# Patient Record
Sex: Male | Born: 1945 | Race: White | Hispanic: No
Health system: Midwestern US, Academic
[De-identification: ages and names within clinical notes are randomized; demographics above are authoritative.]

---

## 2020-06-15 ENCOUNTER — Encounter: Admit: 2020-06-15 | Discharge: 2020-06-15 | Payer: MEDICARE | Primary: Family

## 2020-06-18 ENCOUNTER — Encounter: Admit: 2020-06-18 | Discharge: 2020-06-18 | Payer: MEDICARE | Primary: Family

## 2020-06-19 ENCOUNTER — Encounter: Admit: 2020-06-19 | Discharge: 2020-06-19 | Payer: MEDICARE | Primary: Family

## 2020-06-21 ENCOUNTER — Encounter: Admit: 2020-06-21 | Discharge: 2020-06-21 | Payer: Commercial Managed Care - PPO | Primary: Family

## 2020-06-21 DIAGNOSIS — I38 Endocarditis, valve unspecified: Secondary | ICD-10-CM

## 2020-06-21 DIAGNOSIS — R011 Cardiac murmur, unspecified: Secondary | ICD-10-CM

## 2020-06-21 DIAGNOSIS — M1710 Unilateral primary osteoarthritis, unspecified knee: Secondary | ICD-10-CM

## 2020-06-21 DIAGNOSIS — I350 Nonrheumatic aortic (valve) stenosis: Secondary | ICD-10-CM

## 2020-06-21 DIAGNOSIS — Z720 Tobacco use: Secondary | ICD-10-CM

## 2020-06-21 DIAGNOSIS — E785 Hyperlipidemia, unspecified: Secondary | ICD-10-CM

## 2020-06-21 DIAGNOSIS — M109 Gout, unspecified: Secondary | ICD-10-CM

## 2020-06-21 DIAGNOSIS — E781 Pure hyperglyceridemia: Secondary | ICD-10-CM

## 2020-06-21 DIAGNOSIS — N401 Enlarged prostate with lower urinary tract symptoms: Secondary | ICD-10-CM

## 2020-06-21 DIAGNOSIS — E669 Obesity, unspecified: Secondary | ICD-10-CM

## 2020-06-25 ENCOUNTER — Ambulatory Visit: Admit: 2020-06-25 | Discharge: 2020-06-25 | Payer: Commercial Managed Care - PPO | Primary: Family

## 2020-06-25 ENCOUNTER — Encounter: Admit: 2020-06-25 | Discharge: 2020-06-25 | Payer: Commercial Managed Care - PPO | Primary: Family

## 2020-06-25 DIAGNOSIS — G40909 Epilepsy, unspecified, not intractable, without status epilepticus: Secondary | ICD-10-CM

## 2020-06-25 DIAGNOSIS — R0902 Hypoxemia: Secondary | ICD-10-CM

## 2020-06-25 DIAGNOSIS — I509 Heart failure, unspecified: Secondary | ICD-10-CM

## 2020-06-25 DIAGNOSIS — E8770 Fluid overload, unspecified: Secondary | ICD-10-CM

## 2020-06-25 DIAGNOSIS — I4891 Unspecified atrial fibrillation: Secondary | ICD-10-CM

## 2020-06-25 DIAGNOSIS — F419 Anxiety disorder, unspecified: Secondary | ICD-10-CM

## 2020-06-26 ENCOUNTER — Encounter: Admit: 2020-06-26 | Discharge: 2020-06-26 | Payer: Commercial Managed Care - PPO | Primary: Family
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
## 2020-06-28 ENCOUNTER — Encounter: Admit: 2020-06-28 | Discharge: 2020-06-28 | Payer: Commercial Managed Care - PPO | Primary: Family

## 2020-08-21 ENCOUNTER — Encounter: Admit: 2020-08-21 | Discharge: 2020-08-21 | Payer: Commercial Managed Care - PPO | Primary: Family

## 2020-08-21 DIAGNOSIS — E785 Hyperlipidemia, unspecified: Secondary | ICD-10-CM

## 2020-08-21 DIAGNOSIS — Z952 Presence of prosthetic heart valve: Secondary | ICD-10-CM

## 2020-08-21 DIAGNOSIS — E669 Obesity, unspecified: Secondary | ICD-10-CM

## 2020-08-21 DIAGNOSIS — M109 Gout, unspecified: Secondary | ICD-10-CM

## 2020-08-21 DIAGNOSIS — E781 Pure hyperglyceridemia: Secondary | ICD-10-CM

## 2020-08-21 DIAGNOSIS — I10 Essential (primary) hypertension: Secondary | ICD-10-CM

## 2020-08-21 DIAGNOSIS — N401 Enlarged prostate with lower urinary tract symptoms: Secondary | ICD-10-CM

## 2020-08-21 DIAGNOSIS — R011 Cardiac murmur, unspecified: Secondary | ICD-10-CM

## 2020-08-21 DIAGNOSIS — Z720 Tobacco use: Secondary | ICD-10-CM

## 2020-08-21 DIAGNOSIS — I350 Nonrheumatic aortic (valve) stenosis: Secondary | ICD-10-CM

## 2020-08-21 DIAGNOSIS — M1710 Unilateral primary osteoarthritis, unspecified knee: Secondary | ICD-10-CM

## 2020-08-21 DIAGNOSIS — I38 Endocarditis, valve unspecified: Secondary | ICD-10-CM

## 2020-08-21 NOTE — Patient Instructions
Please continue to monitor vitals daily. Goal for heart rate to be less than 100. If Jacob Dickerson's heart rate is persistently greater than 100, he needs to be seen and evaluated.     Follow up in 3 months with Dr. Sandria Manly.   Labs to be drawn today 08/21/20.    Follow up as directed.  Call sooner if issues.  Call the [facility] nursing line at [phone].  Leave a detailed message for the nurse in [place] [facility]/[place] with how we can assist you and we will call you back.

## 2020-09-27 ENCOUNTER — Encounter: Admit: 2020-09-27 | Discharge: 2020-09-27 | Payer: Commercial Managed Care - PPO | Primary: Family

## 2020-09-27 NOTE — Telephone Encounter
-----   Message from Altamease Oiler, MD sent at 09/27/2020  2:27 PM CST -----  Regarding: RE: elliquis hold epidural  Yes.  Thanks Brett Canales!  ----- Message -----  From: Weston Brass  Sent: 09/27/2020   2:21 PM CST  To: Altamease Oiler, MD  Subject: elliquis hold epidural                           Ok to hold eliquis x 3 days for epidural?    Thanks  Brett Canales

## 2020-10-23 ENCOUNTER — Encounter: Admit: 2020-10-23 | Discharge: 2020-10-23 | Payer: MEDICARE | Primary: Family

## 2020-10-23 NOTE — Progress Notes
attempted to reach pt about labs ordered by wtl at last office visit. no answer.labs not found in [place] system. lmom to have labs drawn before upcoming ov on 2.8.22 w/ wtl. lab orders faxed to [facility] lab

## 2020-10-30 ENCOUNTER — Encounter: Admit: 2020-10-30 | Discharge: 2020-10-30 | Payer: MEDICARE | Primary: Family

## 2020-10-30 DIAGNOSIS — E785 Hyperlipidemia, unspecified: Secondary | ICD-10-CM

## 2020-10-30 DIAGNOSIS — E669 Obesity, unspecified: Secondary | ICD-10-CM

## 2020-10-30 DIAGNOSIS — I10 Essential (primary) hypertension: Secondary | ICD-10-CM

## 2020-10-30 DIAGNOSIS — I38 Endocarditis, valve unspecified: Secondary | ICD-10-CM

## 2020-10-30 DIAGNOSIS — R000 Tachycardia, unspecified: Secondary | ICD-10-CM

## 2020-10-30 DIAGNOSIS — N401 Enlarged prostate with lower urinary tract symptoms: Secondary | ICD-10-CM

## 2020-10-30 DIAGNOSIS — M1710 Unilateral primary osteoarthritis, unspecified knee: Secondary | ICD-10-CM

## 2020-10-30 DIAGNOSIS — E781 Pure hyperglyceridemia: Secondary | ICD-10-CM

## 2020-10-30 DIAGNOSIS — Z720 Tobacco use: Secondary | ICD-10-CM

## 2020-10-30 DIAGNOSIS — I350 Nonrheumatic aortic (valve) stenosis: Secondary | ICD-10-CM

## 2020-10-30 DIAGNOSIS — R011 Cardiac murmur, unspecified: Secondary | ICD-10-CM

## 2020-10-30 DIAGNOSIS — Z952 Presence of prosthetic heart valve: Secondary | ICD-10-CM

## 2020-10-30 DIAGNOSIS — M109 Gout, unspecified: Secondary | ICD-10-CM

## 2020-10-30 MED ORDER — METOPROLOL SUCCINATE 100 MG PO TB24
150 mg | ORAL_TABLET | Freq: Every day | ORAL | 3 refills | 90.00000 days | Status: AC
Start: 2020-10-30 — End: ?

## 2020-11-08 ENCOUNTER — Encounter

## 2020-11-08 DIAGNOSIS — Z952 Presence of prosthetic heart valve: Secondary | ICD-10-CM

## 2020-11-08 DIAGNOSIS — R000 Tachycardia, unspecified: Secondary | ICD-10-CM

## 2020-11-08 DIAGNOSIS — I10 Essential (primary) hypertension: Secondary | ICD-10-CM

## 2020-11-08 DIAGNOSIS — E785 Hyperlipidemia, unspecified: Secondary | ICD-10-CM

## 2020-11-08 LAB — BASIC METABOLIC PANEL
Lab: 1.2 — ABNORMAL HIGH (ref 0.72–1.25)
Lab: 138 — ABNORMAL HIGH (ref <150)
Lab: 29 — ABNORMAL HIGH (ref 5–40)
Lab: 3.8 — ABNORMAL LOW (ref >=40)
Lab: 38 — ABNORMAL HIGH (ref 8.4–25.7)
Lab: 9
Lab: 92
Lab: 99

## 2020-11-08 LAB — LIPID PROFILE: Lab: 126

## 2020-11-09 NOTE — Telephone Encounter
Called patient's nursing facility to check on patient's heart rates and blood pressures after increasing his Toprol XL to 150mg  daily. Spoke to [place], [place] who reports patient's heart rate has been running in the 70s-80s range with pressures in the 110s-120s/70s. The nurse reports patient has no complaints with medication change.       Will route to [facility] for any additional recommendations.

## 2020-11-13 ENCOUNTER — Encounter: Admit: 2020-11-13 | Discharge: 2020-11-13 | Payer: MEDICARE | Primary: Family

## 2020-11-13 DIAGNOSIS — I10 Essential (primary) hypertension: Secondary | ICD-10-CM

## 2020-11-13 DIAGNOSIS — Z952 Presence of prosthetic heart valve: Secondary | ICD-10-CM

## 2020-11-13 NOTE — Telephone Encounter
Called and discussed results with patient.  No questions at this time.  Pt will callback with any questions, concerns or problems.

## 2020-11-13 NOTE — Telephone Encounter
-----   Message from Altamease Oiler, MD sent at 11/12/2020  6:00 PM CST -----  Kidney function looks a little worse.  Lets repeat a basic metabolic panel in 2 weeks.  Also, if he is taking ibuprofen or Motrin that he should cut back or stop.Thanks!  ----- Message -----  From: Rogelia Boga, RN  Sent: 11/08/2020   1:50 PM CST  To: Altamease Oiler, MD    Labs for your review, pt is on atorvastatin 10mg  daily.

## 2020-12-13 ENCOUNTER — Encounter: Admit: 2020-12-13 | Discharge: 2020-12-13 | Payer: MEDICARE | Primary: Family

## 2020-12-13 NOTE — Telephone Encounter
Have called multiple times and left messages requesting pt to have labs rechecked.

## 2021-01-04 ENCOUNTER — Encounter: Admit: 2021-01-04 | Discharge: 2021-01-04 | Payer: MEDICARE | Primary: Family

## 2021-01-04 NOTE — Telephone Encounter
Received call from [facility] with [facility] [phone]) requesting cardiac risk stratification for upcoming procedure.  Mr. Yoak is being scheduled for a wide local excision with flap closure on left forearm.  They are needing risk stratification and recommendations regarding holding Eliquis.  They would like to hold Eliquis for 2 days prior to procedure.      Pt was last seen 10/30/20 by Dr. Sandria Manly. Most recent testing includes an echo in 06/2020.    Left message to check cardiac symptoms.      Will route to Dr. Sandria Manly for recommendations.

## 2021-02-04 ENCOUNTER — Encounter: Admit: 2021-02-04 | Discharge: 2021-02-04 | Payer: MEDICARE | Primary: Family

## 2021-02-04 DIAGNOSIS — E785 Hyperlipidemia, unspecified: Secondary | ICD-10-CM

## 2021-02-04 DIAGNOSIS — Z952 Presence of prosthetic heart valve: Secondary | ICD-10-CM

## 2021-02-04 DIAGNOSIS — I10 Essential (primary) hypertension: Secondary | ICD-10-CM

## 2021-02-04 NOTE — Telephone Encounter
Received call from Shanda Bumps, medical assistant with Dr. Nicholaus Bloom office [phone]).  She called to report that patient saw Dr. Nicholaus Bloom prior to his upcoming carpel tunnel release surgery.  She was concerned because patient has had a 13 pound weight gain over 3 months.  He did express to her that he has noticed some increased dyspnea with exertion.  He does not have any new edema.  They repeated labs which she said were stable.  I have requested that the labs be faxed over for review.      Called pt to discuss symptoms.  He states that he had been having a lot of back pain.  He said the shortness of breath was more because his back hurt, not really that he felt short of breath.  He states that he saw a neurologist and he prescribed gabapentin and a muscle relaxer.  He states that his back is feeling much better and he is getting around a lot better.  He states that he has been working on his diet and he has lost a few pounds, but will continue to work on his diet.      Will route to Dr. Sandria Manly for recommendations.

## 2021-03-12 ENCOUNTER — Encounter: Admit: 2021-03-12 | Discharge: 2021-03-12 | Payer: MEDICARE | Primary: Family

## 2021-03-12 DIAGNOSIS — I4891 Unspecified atrial fibrillation: Secondary | ICD-10-CM

## 2021-03-12 DIAGNOSIS — Z720 Tobacco use: Secondary | ICD-10-CM

## 2021-03-12 DIAGNOSIS — N401 Enlarged prostate with lower urinary tract symptoms: Secondary | ICD-10-CM

## 2021-03-12 DIAGNOSIS — Z952 Presence of prosthetic heart valve: Secondary | ICD-10-CM

## 2021-03-12 DIAGNOSIS — I38 Endocarditis, valve unspecified: Secondary | ICD-10-CM

## 2021-03-12 DIAGNOSIS — E669 Obesity, unspecified: Secondary | ICD-10-CM

## 2021-03-12 DIAGNOSIS — M1710 Unilateral primary osteoarthritis, unspecified knee: Secondary | ICD-10-CM

## 2021-03-12 DIAGNOSIS — E785 Hyperlipidemia, unspecified: Secondary | ICD-10-CM

## 2021-03-12 DIAGNOSIS — M109 Gout, unspecified: Secondary | ICD-10-CM

## 2021-03-12 DIAGNOSIS — R011 Cardiac murmur, unspecified: Secondary | ICD-10-CM

## 2021-03-12 DIAGNOSIS — I10 Essential (primary) hypertension: Secondary | ICD-10-CM

## 2021-03-12 DIAGNOSIS — E781 Pure hyperglyceridemia: Secondary | ICD-10-CM

## 2021-03-12 DIAGNOSIS — I350 Nonrheumatic aortic (valve) stenosis: Secondary | ICD-10-CM

## 2021-03-12 NOTE — Patient Instructions
Echo    Follow up as directed.  Call sooner if issues.  Call the [facility] nursing line at [phone].  Leave a detailed message for the nurse in [place] with how we can assist you and we will call you back.

## 2021-03-14 ENCOUNTER — Encounter: Admit: 2021-03-14 | Discharge: 2021-03-14 | Payer: MEDICARE | Primary: Family

## 2021-03-14 ENCOUNTER — Ambulatory Visit: Admit: 2021-03-14 | Discharge: 2021-03-14 | Payer: MEDICARE | Primary: Family

## 2021-03-14 DIAGNOSIS — I10 Essential (primary) hypertension: Secondary | ICD-10-CM

## 2021-04-23 ENCOUNTER — Ambulatory Visit: Admit: 2021-04-23 | Discharge: 2021-04-23 | Payer: MEDICARE | Primary: Family

## 2021-04-23 ENCOUNTER — Encounter: Admit: 2021-04-23 | Discharge: 2021-04-23 | Payer: MEDICARE | Primary: Family

## 2021-04-23 DIAGNOSIS — I509 Heart failure, unspecified: Secondary | ICD-10-CM

## 2021-04-23 DIAGNOSIS — R079 Chest pain, unspecified: Secondary | ICD-10-CM

## 2021-05-14 ENCOUNTER — Encounter: Admit: 2021-05-14 | Discharge: 2021-05-14 | Payer: MEDICARE | Primary: Family

## 2021-05-14 DIAGNOSIS — I38 Endocarditis, valve unspecified: Secondary | ICD-10-CM

## 2021-05-14 DIAGNOSIS — E781 Pure hyperglyceridemia: Secondary | ICD-10-CM

## 2021-05-14 DIAGNOSIS — I350 Nonrheumatic aortic (valve) stenosis: Secondary | ICD-10-CM

## 2021-05-14 DIAGNOSIS — R011 Cardiac murmur, unspecified: Secondary | ICD-10-CM

## 2021-05-14 DIAGNOSIS — E669 Obesity, unspecified: Secondary | ICD-10-CM

## 2021-05-14 DIAGNOSIS — M109 Gout, unspecified: Secondary | ICD-10-CM

## 2021-05-14 DIAGNOSIS — I4891 Unspecified atrial fibrillation: Secondary | ICD-10-CM

## 2021-05-14 DIAGNOSIS — N401 Enlarged prostate with lower urinary tract symptoms: Secondary | ICD-10-CM

## 2021-05-14 DIAGNOSIS — M1710 Unilateral primary osteoarthritis, unspecified knee: Secondary | ICD-10-CM

## 2021-05-14 DIAGNOSIS — Z720 Tobacco use: Secondary | ICD-10-CM

## 2021-05-14 DIAGNOSIS — E785 Hyperlipidemia, unspecified: Secondary | ICD-10-CM

## 2021-05-28 ENCOUNTER — Encounter: Admit: 2021-05-28 | Discharge: 2021-05-28 | Payer: MEDICARE | Primary: Family

## 2021-05-28 ENCOUNTER — Inpatient Hospital Stay: Admit: 2021-05-28 | Discharge: 2021-05-28 | Payer: MEDICARE | Primary: Family

## 2021-05-28 DIAGNOSIS — I5043 Acute on chronic combined systolic (congestive) and diastolic (congestive) heart failure: Secondary | ICD-10-CM

## 2021-05-28 LAB — BNP (B-TYPE NATRIURETIC PEPTI): BNP: 144 pg/mL — ABNORMAL HIGH (ref 0–100)

## 2021-05-28 LAB — COMPREHENSIVE METABOLIC PANEL
ALBUMIN: 4 g/dL (ref 3.5–5.0)
ALK PHOSPHATASE: 72 U/L (ref 25–110)
ALT: 12 U/L (ref 7–56)
ANION GAP: 13 — ABNORMAL HIGH (ref 3–12)
AST: 26 U/L (ref 7–40)
BLD UREA NITROGEN: 43 mg/dL — ABNORMAL HIGH (ref 7–25)
CALCIUM: 9.2 mg/dL — ABNORMAL HIGH (ref 8.5–10.6)
CHLORIDE: 102 MMOL/L — ABNORMAL LOW (ref 98–110)
CO2: 30 MMOL/L (ref 21–30)
CREATININE: 1.8 mg/dL — ABNORMAL HIGH (ref 0.4–1.24)
EGFR: 39 mL/min — ABNORMAL LOW (ref >60)
GLUCOSE,PANEL: 102 mg/dL — ABNORMAL HIGH (ref 70–100)
POTASSIUM: 4.6 MMOL/L — ABNORMAL LOW (ref 3.5–5.1)
SODIUM: 145 MMOL/L — ABNORMAL LOW (ref 137–147)
TOTAL BILIRUBIN: 1 mg/dL (ref 0.3–1.2)
TOTAL PROTEIN: 6.9 g/dL (ref 6.0–8.0)

## 2021-05-28 LAB — CBC: WBC COUNT: 8.2 K/UL (ref 4.5–11.0)

## 2021-05-28 LAB — DIGOXIN LEVEL: DIGOXIN: 1.2 ng/mL — ABNORMAL HIGH (ref 0.5–1.0)

## 2021-05-28 LAB — TROPONIN-I: TROPONIN I: 0 ng/mL — ABNORMAL HIGH (ref 0.0–0.05)

## 2021-05-28 MED ORDER — TAMSULOSIN 0.4 MG PO CAP
.4 mg | Freq: Every day | ORAL | 0 refills | Status: AC
Start: 2021-05-28 — End: ?
  Administered 2021-05-29 – 2021-06-03 (×6): 0.4 mg via ORAL

## 2021-05-28 MED ORDER — DILTIAZEM HCL 120 MG PO CP24
120 mg | Freq: Every day | ORAL | 0 refills | Status: AC
Start: 2021-05-28 — End: ?
  Administered 2021-05-29 (×2): 120 mg via ORAL

## 2021-05-28 MED ORDER — IMS MIXTURE TEMPLATE
150 mg | Freq: Every day | ORAL | 0 refills | Status: DC
Start: 2021-05-28 — End: 2021-05-29

## 2021-05-28 MED ORDER — ONDANSETRON 4 MG PO TBDI
4 mg | ORAL | 0 refills | Status: AC | PRN
Start: 2021-05-28 — End: ?

## 2021-05-28 MED ORDER — LEVOTHYROXINE 75 MCG PO TAB
75 ug | Freq: Every day | ORAL | 0 refills | Status: AC
Start: 2021-05-28 — End: ?
  Administered 2021-05-29 – 2021-06-03 (×6): 75 ug via ORAL

## 2021-05-28 MED ORDER — MELATONIN 5 MG PO TAB
5 mg | Freq: Every evening | ORAL | 0 refills | Status: AC | PRN
Start: 2021-05-28 — End: ?

## 2021-05-28 MED ORDER — ACETAMINOPHEN 325 MG PO TAB
650 mg | ORAL | 0 refills | Status: AC | PRN
Start: 2021-05-28 — End: ?
  Administered 2021-05-30 – 2021-06-03 (×4): 650 mg via ORAL

## 2021-05-28 MED ORDER — DIVALPROEX 250 MG PO TBEC
750 mg | Freq: Every evening | ORAL | 0 refills | Status: AC
Start: 2021-05-28 — End: ?
  Administered 2021-05-29 – 2021-06-03 (×6): 750 mg via ORAL

## 2021-05-28 MED ORDER — FLUTICASONE PROPIONATE 50 MCG/ACTUATION NA SPSN
2 | Freq: Every day | NASAL | 0 refills | Status: AC
Start: 2021-05-28 — End: ?
  Administered 2021-05-29: 13:00:00 2 via NASAL

## 2021-05-28 MED ORDER — POLYETHYLENE GLYCOL 3350 17 GRAM PO PWPK
1 | Freq: Every day | ORAL | 0 refills | Status: AC | PRN
Start: 2021-05-28 — End: ?

## 2021-05-28 MED ORDER — ATORVASTATIN 10 MG PO TAB
10 mg | Freq: Every day | ORAL | 0 refills | Status: AC
Start: 2021-05-28 — End: ?
  Administered 2021-05-29 – 2021-06-03 (×7): 10 mg via ORAL

## 2021-05-28 MED ORDER — ONDANSETRON HCL (PF) 4 MG/2 ML IJ SOLN
4 mg | INTRAVENOUS | 0 refills | Status: AC | PRN
Start: 2021-05-28 — End: ?

## 2021-05-28 MED ORDER — POTASSIUM CHLORIDE 20 MEQ PO TBTQ
20 meq | Freq: Two times a day (BID) | ORAL | 0 refills | Status: AC
Start: 2021-05-28 — End: ?
  Administered 2021-05-29 (×2): 20 meq via ORAL

## 2021-05-28 MED ORDER — APIXABAN 5 MG PO TAB
5 mg | Freq: Two times a day (BID) | ORAL | 0 refills | Status: AC
Start: 2021-05-28 — End: ?
  Administered 2021-05-29 – 2021-06-03 (×12): 5 mg via ORAL

## 2021-05-28 MED ORDER — SERTRALINE 50 MG PO TAB
50 mg | Freq: Every day | ORAL | 0 refills | Status: AC
Start: 2021-05-28 — End: ?
  Administered 2021-05-29 – 2021-06-03 (×6): 50 mg via ORAL

## 2021-05-28 MED ORDER — SENNOSIDES-DOCUSATE SODIUM 8.6-50 MG PO TAB
1 | Freq: Every day | ORAL | 0 refills | Status: AC | PRN
Start: 2021-05-28 — End: ?

## 2021-05-28 NOTE — Progress Notes
75 yr old worsening SOA for month. Peripheral edema.  No CP. Previously admitted to OSH with CHF. He was diuresised. Cr at discharge was 1.59    Imaging:  CXR- small right pleural effusion   Labs:  WBC 9.3, Hgb 12.0, Hct 40.1, Plat 208, Trop 0.09 (08/24 0.06)  Na 143, K 4.1, BUN 45, Cr 2.01, BNP 157   COVID-19 negative   Meds:  none  Vitals:  173/64  83  26  97.9  90- 91%RA AOX4   EKG- Afib.      Hx:  Follows with Dr Sandria Manly, has been on Torsemide. CHF. HTN. HLD. AFIB. CAD. AVR.     Reason for Transfer:  Hospitalist refusing admission d/t elevated Cr and not improving volume overload

## 2021-05-29 ENCOUNTER — Encounter: Admit: 2021-05-29 | Discharge: 2021-05-29 | Payer: MEDICARE | Primary: Family

## 2021-05-29 ENCOUNTER — Inpatient Hospital Stay: Admit: 2021-05-29 | Discharge: 2021-05-29 | Payer: MEDICARE | Primary: Family

## 2021-05-29 ENCOUNTER — Inpatient Hospital Stay: Admit: 2021-05-29 | Payer: MEDICARE

## 2021-05-29 DIAGNOSIS — N401 Enlarged prostate with lower urinary tract symptoms: Secondary | ICD-10-CM

## 2021-05-29 DIAGNOSIS — M109 Gout, unspecified: Secondary | ICD-10-CM

## 2021-05-29 DIAGNOSIS — E785 Hyperlipidemia, unspecified: Secondary | ICD-10-CM

## 2021-05-29 DIAGNOSIS — I38 Endocarditis, valve unspecified: Secondary | ICD-10-CM

## 2021-05-29 DIAGNOSIS — I350 Nonrheumatic aortic (valve) stenosis: Secondary | ICD-10-CM

## 2021-05-29 DIAGNOSIS — E781 Pure hyperglyceridemia: Secondary | ICD-10-CM

## 2021-05-29 DIAGNOSIS — M1710 Unilateral primary osteoarthritis, unspecified knee: Secondary | ICD-10-CM

## 2021-05-29 DIAGNOSIS — Z720 Tobacco use: Secondary | ICD-10-CM

## 2021-05-29 DIAGNOSIS — R011 Cardiac murmur, unspecified: Secondary | ICD-10-CM

## 2021-05-29 DIAGNOSIS — E669 Obesity, unspecified: Secondary | ICD-10-CM

## 2021-05-29 DIAGNOSIS — I4891 Unspecified atrial fibrillation: Secondary | ICD-10-CM

## 2021-05-29 MED ADMIN — ALLOPURINOL 100 MG PO TAB [310]: 100 mg | ORAL | @ 23:00:00 | NDC 00904704161

## 2021-05-29 MED ADMIN — METOPROLOL SUCCINATE 50 MG PO TB24 [77931]: 150 mg | ORAL | @ 21:00:00 | NDC 00904632361

## 2021-05-29 MED ADMIN — FUROSEMIDE 10 MG/ML IJ SOLN [3291]: 80 mg | INTRAVENOUS | @ 06:00:00 | Stop: 2021-05-29 | NDC 70860030242

## 2021-05-29 MED ADMIN — FUROSEMIDE 10 MG/ML IJ SOLN [3291]: 80 mg | INTRAVENOUS | @ 13:00:00 | Stop: 2021-05-29 | NDC 70860030242

## 2021-05-29 MED ADMIN — PERFLUTREN LIPID MICROSPHERES 1.1 MG/ML IV SUSP [79178]: 2.5 mL | INTRAVENOUS | @ 21:00:00 | Stop: 2021-05-29 | NDC 11994001116

## 2021-05-29 MED ADMIN — FUROSEMIDE 10 MG/ML IJ SOLN [3291]: 80 mg | INTRAVENOUS | @ 22:00:00 | Stop: 2021-05-29 | NDC 70860030242

## 2021-05-29 MED ADMIN — METOPROLOL SUCCINATE 100 MG PO TB24 [78380]: 150 mg | ORAL | @ 21:00:00 | NDC 00904632461

## 2021-05-29 MED ADMIN — METOLAZONE 5 MG PO TAB [10588]: 5 mg | ORAL | @ 21:00:00 | NDC 51079002401

## 2021-05-30 MED ADMIN — POTASSIUM CHLORIDE 20 MEQ PO TBTQ [35943]: 40 meq | ORAL | @ 13:00:00 | NDC 00245531989

## 2021-05-30 MED ADMIN — METOPROLOL SUCCINATE 100 MG PO TB24 [78380]: 150 mg | ORAL | @ 13:00:00 | NDC 00904632461

## 2021-05-30 MED ADMIN — METOPROLOL SUCCINATE 50 MG PO TB24 [77931]: 150 mg | ORAL | @ 13:00:00 | NDC 00904632361

## 2021-05-30 MED ADMIN — POTASSIUM CHLORIDE 20 MEQ PO TBTQ [35943]: 40 meq | ORAL | @ 02:00:00 | NDC 00245531989

## 2021-05-30 MED ADMIN — METOLAZONE 5 MG PO TAB [10588]: 5 mg | ORAL | @ 13:00:00 | Stop: 2021-05-30 | NDC 51079002401

## 2021-05-30 MED ADMIN — ALLOPURINOL 100 MG PO TAB [310]: 100 mg | ORAL | @ 13:00:00 | NDC 00904704161

## 2021-05-30 MED ADMIN — FUROSEMIDE 10 MG/ML IJ SOLN [3291]: 60 mg | INTRAVENOUS | @ 14:00:00 | Stop: 2021-05-30 | NDC 63323028001

## 2021-05-30 MED ADMIN — TORSEMIDE 20 MG PO TAB [18293]: 40 mg | ORAL | @ 23:00:00 | NDC 68084053911

## 2021-05-31 ENCOUNTER — Inpatient Hospital Stay: Admit: 2021-05-31 | Discharge: 2021-05-31 | Payer: MEDICARE | Primary: Family

## 2021-05-31 MED ORDER — PROPOFOL 10 MG/ML IV EMUL 20 ML (INFUSION)(AM)(OR)
INTRAVENOUS | 0 refills | Status: DC
Start: 2021-05-31 — End: 2021-05-31
  Administered 2021-05-31: 21:00:00 100 ug/kg/min via INTRAVENOUS

## 2021-05-31 MED ORDER — PROPOFOL INJ 10 MG/ML IV VIAL
INTRAVENOUS | 0 refills | Status: DC
Start: 2021-05-31 — End: 2021-05-31
  Administered 2021-05-31: 21:00:00 50 mg via INTRAVENOUS
  Administered 2021-05-31: 21:00:00 10 mg via INTRAVENOUS

## 2021-05-31 MED ORDER — PHENYLEPHRINE HCL IN 0.9% NACL 1 MG/10 ML (100 MCG/ML) IV SYRG
INTRAVENOUS | 0 refills | Status: DC
Start: 2021-05-31 — End: 2021-05-31
  Administered 2021-05-31: 21:00:00 300 ug via INTRAVENOUS
  Administered 2021-05-31: 21:00:00 200 ug via INTRAVENOUS
  Administered 2021-05-31: 21:00:00 100 ug via INTRAVENOUS
  Administered 2021-05-31 (×3): 200 ug via INTRAVENOUS

## 2021-05-31 MED ORDER — SODIUM CHLORIDE 0.9 % IV SOLP (OR) 500ML
INTRAVENOUS | 0 refills | Status: DC
Start: 2021-05-31 — End: 2021-05-31
  Administered 2021-05-31: 21:00:00 via INTRAVENOUS

## 2021-05-31 MED ORDER — LIDOCAINE (PF) 20 MG/ML (2 %) IJ SOLN
INTRAVENOUS | 0 refills | Status: DC
Start: 2021-05-31 — End: 2021-05-31
  Administered 2021-05-31: 21:00:00 80 mg via INTRAVENOUS

## 2021-05-31 MED ADMIN — POTASSIUM CHLORIDE 20 MEQ PO TBTQ [35943]: 40 meq | ORAL | @ 02:00:00 | NDC 00245531989

## 2021-05-31 MED ADMIN — METOPROLOL SUCCINATE 100 MG PO TB24 [78380]: 150 mg | ORAL | @ 14:00:00 | NDC 00904632461

## 2021-05-31 MED ADMIN — TORSEMIDE 20 MG PO TAB [18293]: 40 mg | ORAL | @ 23:00:00 | NDC 68084053911

## 2021-05-31 MED ADMIN — TORSEMIDE 20 MG PO TAB [18293]: 40 mg | ORAL | @ 14:00:00 | NDC 68084053911

## 2021-05-31 MED ADMIN — POTASSIUM CHLORIDE 20 MEQ PO TBTQ [35943]: 40 meq | ORAL | @ 14:00:00 | NDC 00245531989

## 2021-05-31 MED ADMIN — METOPROLOL SUCCINATE 50 MG PO TB24 [77931]: 150 mg | ORAL | @ 14:00:00 | NDC 00904632361

## 2021-05-31 MED ADMIN — ALLOPURINOL 100 MG PO TAB [310]: 100 mg | ORAL | @ 14:00:00 | NDC 00904704161

## 2021-05-31 NOTE — Anesthesia Pre-Procedure Evaluation
Anesthesia Pre-Procedure Evaluation    Name: Jacob Dickerson      MRN: [id number]     DOB: 05-02-1946     Age: 75 y.o.     Sex: male   _________________________________________________________________________     Procedure Info:   Procedure Information     Date/Time: 05/31/21 1630    Scheduled providers: Etheleen Mayhew, MD    Procedure: TRANSESOPHAGEAL ECHO    Location: Cardiology:[facility]          Physical Assessment  Vital Signs (last filed in past 24 hours):  BP: 114/49 (09/09 1430)  Temp: 36.8 ?C (98.2 ?F) (09/09 1051)  Pulse: 71 (09/09 1430)  Respirations: 16 PER MINUTE (09/09 1430)  SpO2: 96 % (09/09 1430)  O2 Device: None (Room air) (09/09 1430)  O2 Liter Flow: 3 Lpm (09/09 1051)  Weight: 126.5 kg (278 lb 14.1 oz) (09/09 1430)      Patient History   Allergies   Allergen Reactions   ? Hydrochlorothiazide NAUSEA AND VOMITING and SEE COMMENTS     Allergy recorded in SMS: HYDROCHLOROTHIA~Reactions: NAUSEA, causes gout   ? Niacin EDEMA     Eye swelling, it bothered my eyes  , Reaction: Unknown        Current Medications    Medication Directions   acetaminophen (TYLENOL EXTRA STRENGTH) 500 mg tablet Take 500 mg by mouth every 4 hours as needed for Pain. Max of 4,000 mg of acetaminophen in 24 hours.   allopurinol (ZYLOPRIM) 100 mg tablet Take 1 Tab by mouth twice daily.   apixaban (ELIQUIS) 5 mg tablet Take 5 mg by mouth twice daily.   atorvastatin (LIPITOR) 10 mg tablet Take 1 Tab by mouth daily.   divalproex (DEPAKOTE EC) 250 mg DR tablet Take 750 mg by mouth at bedtime daily. Take with food.   docusate (COLACE) 100 mg capsule Take 100 mg by mouth twice daily.   fluticasone propionate (FLONASE) 50 mcg/actuation nasal spray, suspension Apply 2 sprays to each nostril as directed daily. Shake bottle gently before using.   furosemide (LASIX) 40 mg tablet Take 60 mg by mouth daily. Take addition 40mg  if wt gain of 3 lbs   ibuprofen (MOTRIN) 400 mg tablet Take 400 mg by mouth every 6 hours as needed for Pain. Take with food.   levothyroxine (SYNTHROID) 75 mcg tablet Take 75 mcg by mouth daily 30 minutes before breakfast.   lisinopril (ZESTRIL) 10 mg tablet Take 10 mg by mouth daily.   magnesium hydroxide (MILK OF MAGNESIA PO) Take  by mouth.   metOLazone (ZAROXOLYN) 2.5 mg tablet Take 2.5 mg by mouth as directed. One tablet by mouth Monday and Friday   metoprolol XL (TOPROL XL) 100 mg extended release tablet Take 1.5 tablets by mouth daily.   potassium chloride SR (K-DUR) 20 mEq tablet Take 1 tablet by mouth twice daily. Take with a meal and a full glass of water.   sertraline (ZOLOFT) 50 mg tablet Take 50 mg by mouth daily.   tamsulosin (FLOMAX) 0.4 mg capsule Take 0.4 mg by mouth daily. Do not crush, chew or open capsules. Take 30 minutes following the same meal each day.         Review of Systems/Medical History      Patient summary reviewed  Nursing notes reviewed  Pertinent labs reviewed    PONV Screening: Non-smoker  No history of anesthetic complications  No family history of anesthetic complications  Pulmonary       Not a current smoker        No indications/hx of asthma    no COPD      No recent URI      3L of O2 since being hospitalized d/t CHF      Cardiovascular       Recent diagnostic studies:          echocardiogram      Exercise tolerance: >4 METS      Beta Blocker therapy: No      Beta blockers within 24 hours: n/a      Hypertension (Diltiazem),         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      Coronary artery disease        Dysrhythmias; atrial fibrillation      CHF      Hyperlipidemia (Statin)      GI/Hepatic/Renal         No hx of liver disease     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)      Neuro/Psych       No seizures      No CVA        Psychiatric history          Depression      Musculoskeletal         Arthritis      Endocrine/Other         Hypothyroidism      Obesity      Gout    Constitution - negative   Physical Exam    Airway Findings      Mallampati: III      TM distance: >3 FB      Neck ROM: full      Mouth opening: good      Airway patency: adequate    Dental Findings:       Increased risk for dental injury; pt advised          Cardiovascular Findings:       Rhythm: irregular      Rate: normal      Other findings: murmur    Pulmonary Findings:       Breath sounds clear to auscultation.    Abdominal Findings:       Obese    Neurological Findings:       Alert and oriented x 3    Constitutional findings:       No acute distress       2D ECHO 05/29/21:  1. Moderate LV dilation, LVIDD 6.6 cm, LV volume index 88 mL/m? with preserved LVEF of 66%  2. Mild RV dilation with preserved RV systolic function  ? Bioprosthetic aortic valve 27 mm Thermo fix, April 2009  1. Moderate aortic regurgitation, paravalvular, origin 1 o'clock position, adjacent to the right coronary cusps, pressure half-time 250 ms  2. Mild bioprosthetic aortic stenosis, mean gradient of 22.8 mmHg, peak velocity of 3.1 m/s  3. Mild MR, echodensity attached to mitral annulus, mobile, moves back-and-forth both in LV and LA cavity during diastole and systole respectively, differential diagnosis mitral annular calcification versus ruptured chordae  4. Markedly elevated CVP> 15 mmHg & PA systolic pressure 49 mmHg.    5. 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  Compared to prior study dated 04/23/2021, no significant interval changes are noted in aortic regurgitation severity, LVEF however echodensity on  mitral annulus is well visualized  due to better visualization of, mitral valve in the current study.  CVP was 8 mmHg previously.      Diagnostic Tests  Hematology:   Lab Results   Component Value Date    HGB 10.5 05/31/2021    HCT 32.0 05/31/2021    PLTCT 169 05/31/2021    WBC 6.9 05/31/2021    NEUT 71 07/20/2010    ANC 5.41 07/20/2010    ALC 1.55 07/20/2010    MONA 8 07/20/2010    AMC 0.65 07/20/2010    EOSA 1 07/20/2010    ABC 0.03 07/20/2010    MCV 95.5 05/31/2021    MCH 31.3 05/31/2021    MCHC 32.8 05/31/2021    MPV 9.2 05/31/2021    RDW 17.2 05/31/2021         General Chemistry:   Lab Results   Component Value Date    NA 145 05/31/2021    K 4.8 05/31/2021    CL 102 05/31/2021    CO2 33 05/31/2021    GAP 10 05/31/2021    BUN 51 05/31/2021    CR 1.81 05/31/2021    GLU 101 05/31/2021    GLU 88 08/27/2011    CA 8.6 05/31/2021    ALBUMIN 3.6 05/31/2021    OBSCA 1.14 12/30/2007    MG 2.3 05/31/2021    TOTBILI 0.7 05/31/2021    PO4 2.2 01/01/2008      Coagulation:   Lab Results   Component Value Date    PTT 24.9 12/29/2007    INR 1.1 12/30/2007         Anesthesia Plan    ASA score: 3   Plan: MAC  Induction method: intravenous  NPO status: acceptable      Informed Consent  Anesthetic plan and risks discussed with patient.  Use of blood products discussed with patient  Blood Consent: consented      Plan discussed with: anesthesiologist and CRNA.

## 2021-06-01 MED ADMIN — POTASSIUM CHLORIDE 20 MEQ PO TBTQ [35943]: 40 meq | ORAL | @ 13:00:00 | NDC 00245531989

## 2021-06-01 MED ADMIN — TORSEMIDE 20 MG PO TAB [18293]: 40 mg | ORAL | @ 13:00:00 | NDC 68084053911

## 2021-06-01 MED ADMIN — ALLOPURINOL 100 MG PO TAB [310]: 100 mg | ORAL | @ 13:00:00 | NDC 00904704161

## 2021-06-01 MED ADMIN — TORSEMIDE 20 MG PO TAB [18293]: 40 mg | ORAL | @ 23:00:00 | NDC 68084053911

## 2021-06-01 MED ADMIN — POTASSIUM CHLORIDE 20 MEQ PO TBTQ [35943]: 40 meq | ORAL | @ 01:00:00 | NDC 00245531989

## 2021-06-01 MED ADMIN — METOPROLOL SUCCINATE 100 MG PO TB24 [78380]: 150 mg | ORAL | @ 13:00:00 | NDC 00904632461

## 2021-06-01 MED ADMIN — METOPROLOL SUCCINATE 50 MG PO TB24 [77931]: 150 mg | ORAL | @ 13:00:00 | NDC 00904632361

## 2021-06-01 NOTE — Anesthesia Post-Procedure Evaluation
Post-Anesthesia Evaluation    Name: Jacob Dickerson      MRN: [id number]     DOB: 07/17/1946     Age: 75 y.o.     Sex: male   __________________________________________________________________________     Procedure Information     Anesthesia Start Date/Time: 05/31/21 1600    Procedure: TRANSESOPHAGEAL ECHO    Location: [facility]iology:[facility]          Post-Anesthesia Vitals      BP: 108/36 (09/09 1930)  Temp: 36.7 C (98.1 F) (09/09 1930)  Pulse: 89 (09/09 1930)  Respirations: 20 PER MINUTE (09/09 1930)  SpO2: 95 % (09/09 1930)  O2 Device: Nasal cannula (09/09 1930)  O2 Liter Flow: 3 Lpm (09/09 1930)  Height: 162.6 cm (5' 4.02") (09/09 1430)        Post Anesthesia Evaluation Note    Evaluation location: Pre/Post  Patient participation: recovered; patient participated in evaluation  Level of consciousness: alert  Pain management: adequate    Hydration: normovolemia  Temperature: 36.0C - 38.4C  Airway patency: adequate    Perioperative Events       Post-op nausea and vomiting: no PONV    Postoperative Status  Cardiovascular status: hemodynamically stable  Respiratory status: spontaneous ventilation  Follow-up needed: none        Perioperative Events  There were no known complications for this encounter.

## 2021-06-02 MED ADMIN — METOPROLOL SUCCINATE 100 MG PO TB24 [78380]: 150 mg | ORAL | @ 15:00:00 | NDC 00904632461

## 2021-06-02 MED ADMIN — POTASSIUM CHLORIDE 20 MEQ PO TBTQ [35943]: 40 meq | ORAL | @ 01:00:00 | NDC 00245531989

## 2021-06-02 MED ADMIN — ALLOPURINOL 100 MG PO TAB [310]: 100 mg | ORAL | @ 15:00:00 | NDC 00904704161

## 2021-06-02 MED ADMIN — POTASSIUM CHLORIDE 20 MEQ PO TBTQ [35943]: 20 meq | ORAL | @ 15:00:00 | NDC 00245531989

## 2021-06-02 MED ADMIN — TORSEMIDE 20 MG PO TAB [18293]: 40 mg | ORAL | @ 15:00:00 | NDC 68084053911

## 2021-06-02 MED ADMIN — TORSEMIDE 20 MG PO TAB [18293]: 40 mg | ORAL | @ 21:00:00 | NDC 68084053911

## 2021-06-02 MED ADMIN — METOPROLOL SUCCINATE 50 MG PO TB24 [77931]: 150 mg | ORAL | @ 15:00:00 | NDC 00904632361

## 2021-06-03 ENCOUNTER — Encounter: Admit: 2021-06-03 | Discharge: 2021-06-03 | Payer: MEDICARE | Primary: Family

## 2021-06-03 MED ADMIN — METOPROLOL SUCCINATE 100 MG PO TB24 [78380]: 150 mg | ORAL | @ 13:00:00 | Stop: 2021-06-03 | NDC 00904632461

## 2021-06-03 MED ADMIN — METOPROLOL SUCCINATE 50 MG PO TB24 [77931]: 150 mg | ORAL | @ 13:00:00 | Stop: 2021-06-03 | NDC 00904632361

## 2021-06-03 MED ADMIN — POTASSIUM CHLORIDE 20 MEQ PO TBTQ [35943]: 20 meq | ORAL | @ 03:00:00 | NDC 00245531989

## 2021-06-03 MED ADMIN — ALLOPURINOL 100 MG PO TAB [310]: 100 mg | ORAL | @ 13:00:00 | Stop: 2021-06-03 | NDC 00904704161

## 2021-06-04 ENCOUNTER — Encounter: Admit: 2021-06-04 | Discharge: 2021-06-04 | Payer: MEDICARE | Primary: Family

## 2021-06-04 NOTE — Telephone Encounter
Patient Discharge Date from hospital: 06/03/21  Date Call Attempted: 06/04/21  Number of Attempts: 1  Date Call Completed:  06/04/21      Next Appointment    Next follow-up appointment on 06/07/21 at 1100 with Justus Memory, APRN at our [facility] clinic.    Transportation    Pt is unable to follow at [place] at all. His local cardiologist is is Dr. Sandria Manly and he sees in at our [place] clinic.      Home Health    No, pt resides at [facility]) [phone]    Medications    Pt informed he resides at [facility] in [place], [place]. States there is some confusion and they have not made the med changes yet. Staff administers meds.  Pt gave his copy of dc orders again this am. Routing IB to [place] team to review with Dr. Sandria Manly for post hospital plan.    START taking:  torsemide [facility])  Start taking on: June 04, 2021  CHANGE how you take:  lisinopril (ZESTRIL)  potassium chloride SR (K-DUR)  STOP taking:  furosemide 40 mg tablet (LASIX)  ibuprofen 400 mg tablet (MOTRIN)  metOLazone 2.5 mg tablet (ZAROXOLYN)    Diet    2 G Na, 2 L fluid restriction    Scale/Weight    Pt states staff are weighing pt daily. Wt today was 288.5.    Signs and Symptoms    Pt reports the following symptoms:     BLE edema and upper abdominal bloating/tightness is much improved since admission. States SOA is also much better.     Pt verbalized understanding of signs and symptoms of HF and when to contact a provider or seek immediate assistance at the ER.    Intervention(s)    Call placed to [facility] and spoke to [place]. She confirmed she did receive the discharge orders and are making the necessary changes. She has BMP and Mag orders to be drawn 06/05/21. She will fax results to Dr. Sandria Manly. Denny Peon also confirmed pt has no way to get to [facility] for his post hospital OV 06/07/21. Assured her we will forward to Dr. Imagene Gurney team to follow up on post hospital plan.     Pt educated on the importance of weighing daily first thing in the morning before dressing, before eating or drinking, and after voiding using the same scale in the same location and write results down in note pad or log. Notify us for weight gains of 3 lbs in one day or 5 lbs in one week. Notify your provider for increased SOA.  Notify your provider for swelling or increased swelling in BLE or abdominal fullness/bloating. Advised to check B/P at least once daily. Check 1-2 hours after am meds. Log results. Check B/P other times if feeling lightheaded, dizzy, or if you feel your heart rate is elevated. Document the time you checked and any symptoms you may be feeling at the time. Call 911 for sudden, severe chest/pain pressure/SOA develops. Be sure to keep your follow up appointment and bring your weight logs, B/P logs, and medication list with you to your appointment. Call us at [phone] if you have any questions.     Plan of Care    Continued education needed for heart failure symptom management and when to contact our office.

## 2021-06-13 ENCOUNTER — Encounter: Admit: 2021-06-13 | Discharge: 2021-06-13 | Payer: MEDICARE | Primary: Family

## 2021-06-13 DIAGNOSIS — M1710 Unilateral primary osteoarthritis, unspecified knee: Secondary | ICD-10-CM

## 2021-06-13 DIAGNOSIS — E669 Obesity, unspecified: Secondary | ICD-10-CM

## 2021-06-13 DIAGNOSIS — Z720 Tobacco use: Secondary | ICD-10-CM

## 2021-06-13 DIAGNOSIS — M109 Gout, unspecified: Secondary | ICD-10-CM

## 2021-06-13 DIAGNOSIS — N183 Stage 3 chronic kidney disease, unspecified whether stage 3a or 3b CKD (HCC): Secondary | ICD-10-CM

## 2021-06-13 DIAGNOSIS — I4891 Unspecified atrial fibrillation: Secondary | ICD-10-CM

## 2021-06-13 DIAGNOSIS — E781 Pure hyperglyceridemia: Secondary | ICD-10-CM

## 2021-06-13 DIAGNOSIS — I350 Nonrheumatic aortic (valve) stenosis: Secondary | ICD-10-CM

## 2021-06-13 DIAGNOSIS — R011 Cardiac murmur, unspecified: Secondary | ICD-10-CM

## 2021-06-13 DIAGNOSIS — I38 Endocarditis, valve unspecified: Secondary | ICD-10-CM

## 2021-06-13 DIAGNOSIS — I5043 Acute on chronic combined systolic (congestive) and diastolic (congestive) heart failure: Secondary | ICD-10-CM

## 2021-06-13 DIAGNOSIS — I4821 Permanent atrial fibrillation: Secondary | ICD-10-CM

## 2021-06-13 DIAGNOSIS — Z952 Presence of prosthetic heart valve: Secondary | ICD-10-CM

## 2021-06-13 DIAGNOSIS — E785 Hyperlipidemia, unspecified: Secondary | ICD-10-CM

## 2021-06-13 DIAGNOSIS — I5033 Acute on chronic diastolic (congestive) heart failure: Secondary | ICD-10-CM

## 2021-06-13 DIAGNOSIS — I351 Nonrheumatic aortic (valve) insufficiency: Secondary | ICD-10-CM

## 2021-06-13 DIAGNOSIS — I2510 Atherosclerotic heart disease of native coronary artery without angina pectoris: Secondary | ICD-10-CM

## 2021-06-13 DIAGNOSIS — E7800 Pure hypercholesterolemia, unspecified: Secondary | ICD-10-CM

## 2021-06-13 DIAGNOSIS — N401 Enlarged prostate with lower urinary tract symptoms: Secondary | ICD-10-CM

## 2021-06-13 NOTE — Progress Notes
Date of Service: 06/13/2021    Jacob Dickerson is a 75 y.o. male.       HPI     Jacob Dickerson is a 75 y.o.  white male s/p recent hospitalization at [facility] between 05/28/2021 - 06/03/2021,.  Patient presented with HFpEF, acute on chronic heart failure exacerbation with preserved EF, acute hypoxic respiratory failure, CKD.  During this hospitalization he was diuresed with intravenous loop diuretics, the hemodynamic parameters have been closely followed, as well as the electrolytes and his weight.    In addition patient does have a history of chronic HFrEF, bicuspid aortic valve status post AVR, atrial fibrillation, hypertension, hyperlipidemia, class III obesity, CAD, CKD stage III.  The troponin was also mildly elevated, there was 0.04 and 0.05, this was a non-ST type II MI in the setting of acute on chronic heart failure preserved ejection fraction and respiratory failure.  During this hospitalization patient did experience atypical left-sided chest pain it was a sharp pain.    ? He was evaluated with an echocardiogram on 05/29/2021 this demonstrated normal LVEF = 66%, mildly dilated RV with preserved systolic function, moderate bioprosthetic aortic valve regurgitation with an elevated mean gradient = 22.8 mmHg, an echodensity was present and attached to the mitral valve annulus, pulmonary hypertension, PAP = 49 mmHg.    ? Subsequent to these findings patient was evaluated with a TEE on 05/31/2021- calcification of the subvalvular mitral valve apparatus with minimal chordae detached that moved across the valve plane into the atrium was noted, mild regurgitation, the bioprosthetic valve was not fully assessed for stenosis, moderate regurgitation was present.    Patient is here today for a follow-up office visit.  He currently lives in an assisted living place in [place], [place].  He does not report chest pain and no heart palpitations.  He continues to have mild bilateral lower extremity edema, overall his symptoms are not worse, and he is doing fairly well.  Patient admits to not being fully compliant with a low-sodium diet.       Vitals:    06/13/21 1023   BP: 132/60   BP Source: Arm, Left Upper   Pulse: 96   SpO2: 92%   O2 Device: None (Room air)   PainSc: Zero   Weight: 133.8 kg (295 lb)   Height: 162.6 cm (5' 4)     Body mass index is 50.64 kg/m?Marland Kitchen     Past Medical History  Patient Active Problem List    Diagnosis Date Noted   ? Morbid obesity (HCC) 05/29/2021   ? Acute on chronic heart failure with preserved ejection fraction (HCC) 05/29/2021   ? 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 Demand ischemia (HCC) 05/29/2021   ? Acute respiratory failure with hypoxia (HCC) 05/29/2021   ? Coronary artery disease involving native coronary artery of native heart without angina pectoris 05/29/2021   ? Acute kidney injury (HCC) 05/29/2021   ? Stage 3 chronic kidney disease (HCC) 05/29/2021   ? CHF (congestive heart failure), NYHA class I, acute on chronic, combined (HCC) 05/28/2021   ? Atrial fibrillation (HCC) 05/14/2021   ? Wrist fracture, left 03/03/2011     - has full ROM.   Occasional pain in it but able to use it full time.     ? Primary hypertension 12/17/2010   ? Hyperlipidemia 12/17/2010   ? Gout attack 11/25/2010   ? Bladder stone 06/29/2009   ?  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 mild left ventricular hypertrophy, 17mm gradient across the aortic valve with mildly enlarged aortic root.    10/18/15- [facility]- Lexiscan MPI- Normal LV perfusion. Imaging is negative for ischemia. Global LV systolic function normal with EF 64%. Normal wall motion. Small, fixed, basal- inferolateral defect.  11/12/17- [facility]- Echo- Mild concentric LV hypertrophy. Normal LV function EF 55-60%. Well seated bioprosthetic valve. Mild tricuspid insufficiency. Mildly dilated aortic root.         ? Sprain and strain of other specified sites of shoulder and upper arm 02/02/2007         Review of Systems   Constitutional: Positive for weight gain.   HENT: Negative.    Eyes: Negative.    Cardiovascular: Positive for leg swelling.   Respiratory: Positive for shortness of breath.    Endocrine: Negative.    Hematologic/Lymphatic: Negative.    Skin: Positive for suspicious lesions.   Musculoskeletal: Negative.    Gastrointestinal: Negative.    Genitourinary: Negative.    Neurological: Positive for numbness.   Psychiatric/Behavioral: Negative.    Allergic/Immunologic: Negative.        Physical Exam  General Appearance: obese  Skin: warm, moist, no ulcers or xanthomas  Eyes: conjunctivae and lids normal, pupils are equal and round  Lips & Oral Mucosa: no pallor or cyanosis  Neck Veins: neck veins are flat, neck veins are not distended  Chest Inspection: chest is normal in appearance  Respiratory Effort: breathing comfortably, no respiratory distress  Auscultation/Percussion: lungs clear to auscultation, no rales or rhonchi, no wheezing  Cardiac Rhythm: regular rhythm and normal rate  Cardiac Auscultation: S1, S2 normal, no rub, no gallop  Murmurs: 2/6 systolic murmur at the right upper sternal border, distant heart sounds  Carotid Arteries: normal carotid upstroke bilaterally, no bruit  Abdominal aorta: could not be examined due to obese adomen  Lower Extremity Edema: 1-2+ bilateral pretibial edema  Abdominal Exam: soft, non-tender, no masses, bowel sounds normal  Liver & Spleen: no organomegaly  Language and Memory: patient responsive and seems to comprehend information  Neurologic Exam: neurological assessment grossly intact      Cardiovascular Studies      Cardiovascular Health Factors  Vitals BP Readings from Last 3 Encounters:   06/13/21 132/60   06/03/21 136/43   04/24/21 128/46     Wt Readings from Last 3 Encounters:   06/13/21 133.8 kg (295 lb)   06/03/21 131.5 kg (290 lb)   04/24/21 (!) 138.8 kg (306 lb)     BMI Readings from Last 3 Encounters:   06/13/21 50.64 kg/m?   06/03/21 49.75 kg/m?   04/24/21 52.52 kg/m?      Smoking Social History     Tobacco Use   Smoking Status Former Smoker   ? Packs/day: 0.25   ? Years: 30.00   ? Pack years: 7.50   ? Types: Cigarettes   ? Quit date: 05/11/2009   ? Years since quitting: 12.0   Smokeless Tobacco Never Used   Tobacco Comment    quit 2008      Lipid Profile Cholesterol   Date Value Ref Range Status   11/08/2020 126  Final     HDL   Date Value Ref Range Status   11/08/2020 20 (L) >=40 Final     LDL   Date Value Ref Range Status   11/08/2020 60  Final     Triglycerides   Date Value Ref Range Status   11/08/2020 232 (H) <150 Final      Blood Sugar Hemoglobin A1C   Date Value Ref Range Status   12/29/2013 5.4 4.0 - 6.0 % Final     Comment:     The ADA recommends that most patients with type 1 and type 2 diabetes maintain   an A1c level <7%.       Glucose   Date Value Ref Range Status   06/03/2021 100 70 - 100 MG/DL Final   16/06/9603 540 (H) 70 - 100 MG/DL Final   98/07/9146 829 (H) 70 - 100 MG/DL Final   56/21/3086 88 65 - 99 mg/dL Final     Comment:                   Fasting reference interval      04/17/2006 89 70 - 110 MG/DL Final   57/84/6962 86 70 - 110 MG/DL Final     Glucose, POC   Date Value Ref Range Status   01/02/2008 117 (H) 70 - 110 MG/DL Final   95/28/4132 440 (H) 70 - 110 MG/DL Final   07/19/2535 644 (H) 70 - 110 MG/DL Final          Problems Addressed Today  Encounter Diagnoses   Name Primary?   ? Acute on chronic heart failure with preserved ejection fraction (HCC) Yes   ? Permanent atrial fibrillation (HCC)    ? CHF (congestive heart failure), NYHA class I, acute on chronic, combined (HCC)    ? Coronary artery disease involving native coronary artery of native heart without angina pectoris    ? Nonrheumatic aortic valve insufficiency    ? Pure hypercholesterolemia    ? S/P AVR (aortic valve replacement)    ? Morbid obesity (HCC)    ? Stage 3 chronic kidney disease, unspecified whether stage 3a or 3b CKD (HCC)        Assessment and Plan     Assessment:    1. ?Heart failure preserved ejection fraction-?  ? Patient presents with acute on chronic symptoms, has had abdominal shortness of breath prior to this admission, clinical exam findings and chest ray findings were compatible with this diagnosis  ? He underwent diuresis with IV loop diuretics and responded well  ? Decreased bilateral lower extremity edema prior to discharge, today patient does have some mild recurrent bilateral pretibial edema  2. ?Atypical angina-?occurred in conjunction with symptoms of HFpEF exacerbation, troponin was mildly elevated perhaps mostly due to underlying myocardial strain and heart failure  ? Two?sets of cardiac markers were normal, 0.04 and 0.05  3. ?History of severe aortic valve regurgitation-patient did undergo AVR with #27 Therma Fix pericardial valve in April 2009  ? Abnormal bioprosthetic valve by an echocardiogram dated 05/29/2021 paravalvular regurgitation was present as well as bioprosthetic stenosis by the study  ? TEE performed on 05/31/2021- normal LV function, the LV was mildly dilated, mitral valve regurgitation, moderate regurgitation of the bioprosthetic aortic valve #27 Thermo Fix  4. ?Bicuspid aortic valve-this was diagnosed intraoperatively  5. ?Morbid obesity-patient's BMI = 50.64 kg/m?  6. ?Probably sleep apnea-?patient does have the body habitus for sleep apnea, however has not been diagnosed with this?  7. ?Permanent atrial fibrillation-rate is well controlled and patient is anticoagulated with apixaban  8.???Atypical left-sided chest pain-this was entirely reproducible by palpation, it was a sharp chest pain, it is not compatible with angina  9.  Noncompliance with a strict low-sodium diet    Plan:    1.  Continue torsemide 40 mg p.o. every morning and added torsemide 20 mg p.o. every afternoon  2.  Continue all other current cardiac medications  3. Chem-7 in 1 week  4.  Follow a strict low-sodium diet  5.  Follow-up in [place] office with Dr. Sandria Manly on 07/02/2021    Total Time Today was 40 minutes in the following activities: Preparing to see the patient, Obtaining and/or reviewing separately obtained history, Performing a medically appropriate examination and/or evaluation, Counseling and educating the patient/family/caregiver, Ordering medications, tests, or procedures, Referring and communication with other health care professionals (when not separately reported), Documenting clinical information in the electronic or other health record, Independently interpreting results (not separately reported) and communicating results to the patient/family/caregiver and Care coordination (not separately reported)         Current Medications (including today's revisions)  ? acetaminophen (TYLENOL EXTRA STRENGTH) 500 mg tablet Take 500 mg by mouth every 4 hours as needed for Pain. Max of 4,000 mg of acetaminophen in 24 hours.   ? allopurinol (ZYLOPRIM) 100 mg tablet Take 1 Tab by mouth twice daily.   ? aluminum & magnesium hydroxide/simethicone (MAG-AL PLUS XS) 400/400/40 mg/5 mL suspension Take 15 mL by mouth every 6 hours as needed.   ? apixaban (ELIQUIS) 5 mg tablet Take 5 mg by mouth twice daily.   ? atorvastatin (LIPITOR) 10 mg tablet Take 1 Tab by mouth daily.   ? diclofenac sodium (VOLTAREN) 1 % topical gel Apply  topically to affected area four times daily.   ? digoxin (LANOXIN) 125 mcg (0.125 mg) tablet Take 1 tablet by mouth daily.   ? dilTIAZem XC (TIAZAC) 120 mg capsule Take 1 capsule by mouth daily.   ? dilTIAZem XC (TIAZAC) 240 mg capsule Take 240 mg by mouth daily.   ? divalproex (DEPAKOTE EC) 250 mg DR tablet Take 750 mg by mouth at bedtime daily. Take with food.   ? docusate (COLACE) 100 mg capsule Take 100 mg by mouth twice daily as needed.   ? fluticasone propionate (FLONASE) 50 mcg/actuation nasal spray, suspension Apply 2 sprays to each nostril as directed daily. Shake bottle gently before using.   ? ibuprofen (MOTRIN) 400 mg tablet Take 400 mg by mouth every 4 hours as needed for Pain. Take with food.   ? levothyroxine (SYNTHROID) 75 mcg tablet Take 75 mcg by mouth daily 30 minutes before breakfast.   ? lisinopril (ZESTRIL) 10 mg tablet Take one tablet by mouth daily. Hold until instructed to resume by cardiology.   ? magnesium hydroxide (MILK OF MAGNESIA PO) Take 30 mL by mouth as Needed.   ? metOLazone (ZAROXOLYN) 2.5 mg tablet Take 2.5 mg by mouth daily.   ? metoprolol XL (TOPROL XL) 100 mg extended release tablet Take 1.5 tablets by mouth daily.   ? neomycin/polymyxin/gramicidin 1.75 mg-10,000 unit-0.025mg /mL ophthalmic sol Place  into or around eye(s) daily.   ? omeprazole DR (PRILOSEC) 40 mg capsule Take 40 mg by mouth daily.   ? potassium chloride SR (K-DUR) 20 mEq tablet Take one tablet by mouth daily. Take with a meal and a full glass of water. Resume 9/13.   ? sertraline (ZOLOFT) 50 mg tablet Take 50 mg by mouth daily.   ? tamsulosin (FLOMAX) 0.4 mg capsule Take 0.4 mg by mouth daily. Do not crush, chew or open capsules. Take 30 minutes following the same meal each day.   ?  torsemide (DEMADEX) 20 mg tablet Take two tablets by mouth daily.

## 2021-06-21 ENCOUNTER — Encounter: Admit: 2021-06-21 | Discharge: 2021-06-21 | Payer: MEDICARE | Primary: Family

## 2021-06-21 DIAGNOSIS — N183 Stage 3 chronic kidney disease, unspecified whether stage 3a or 3b CKD (HCC): Secondary | ICD-10-CM

## 2021-06-21 DIAGNOSIS — J9601 Acute respiratory failure with hypoxia: Secondary | ICD-10-CM

## 2021-06-21 DIAGNOSIS — I5033 Acute on chronic diastolic (congestive) heart failure: Secondary | ICD-10-CM

## 2021-06-21 DIAGNOSIS — N179 Acute kidney failure, unspecified: Secondary | ICD-10-CM

## 2021-06-21 LAB — BASIC METABOLIC PANEL
BLD UREA NITROGEN: 32 — ABNORMAL HIGH
CALCIUM: 8.8
CHLORIDE: 101
CO2: 29
CREATININE: 1.8 — ABNORMAL HIGH
GLUCOSE,PANEL: 131 — ABNORMAL HIGH
POTASSIUM: 3.8
SODIUM: 141

## 2021-07-02 ENCOUNTER — Encounter: Admit: 2021-07-02 | Discharge: 2021-07-02 | Payer: MEDICARE | Primary: Family

## 2021-07-02 DIAGNOSIS — E781 Pure hyperglyceridemia: Secondary | ICD-10-CM

## 2021-07-02 DIAGNOSIS — I4891 Unspecified atrial fibrillation: Secondary | ICD-10-CM

## 2021-07-02 DIAGNOSIS — E669 Obesity, unspecified: Secondary | ICD-10-CM

## 2021-07-02 DIAGNOSIS — Z952 Presence of prosthetic heart valve: Secondary | ICD-10-CM

## 2021-07-02 DIAGNOSIS — I5033 Acute on chronic diastolic (congestive) heart failure: Secondary | ICD-10-CM

## 2021-07-02 DIAGNOSIS — I2510 Atherosclerotic heart disease of native coronary artery without angina pectoris: Secondary | ICD-10-CM

## 2021-07-02 DIAGNOSIS — Z720 Tobacco use: Secondary | ICD-10-CM

## 2021-07-02 DIAGNOSIS — N401 Enlarged prostate with lower urinary tract symptoms: Secondary | ICD-10-CM

## 2021-07-02 DIAGNOSIS — I38 Endocarditis, valve unspecified: Secondary | ICD-10-CM

## 2021-07-02 DIAGNOSIS — E785 Hyperlipidemia, unspecified: Secondary | ICD-10-CM

## 2021-07-02 DIAGNOSIS — I5043 Acute on chronic combined systolic (congestive) and diastolic (congestive) heart failure: Secondary | ICD-10-CM

## 2021-07-02 DIAGNOSIS — R011 Cardiac murmur, unspecified: Secondary | ICD-10-CM

## 2021-07-02 DIAGNOSIS — I10 Essential (primary) hypertension: Secondary | ICD-10-CM

## 2021-07-02 DIAGNOSIS — M109 Gout, unspecified: Secondary | ICD-10-CM

## 2021-07-02 DIAGNOSIS — I350 Nonrheumatic aortic (valve) stenosis: Secondary | ICD-10-CM

## 2021-07-02 DIAGNOSIS — M179 Osteoarthritis of knee, unspecified: Secondary | ICD-10-CM

## 2021-07-02 MED ORDER — TORSEMIDE 20 MG PO TAB
40 mg | ORAL_TABLET | Freq: Two times a day (BID) | ORAL | 3 refills | 67.50000 days | Status: AC
Start: 2021-07-02 — End: ?
  Filled 2021-08-10: qty 270, 90d supply, fill #1

## 2021-07-02 MED ORDER — METOLAZONE 2.5 MG PO TAB
2.5 mg | ORAL_TABLET | ORAL | 0 refills | 84.00000 days | Status: AC
Start: 2021-07-02 — End: ?

## 2021-07-02 NOTE — Patient Instructions
Thank you for visiting our office today.    We would like to make the following medication adjustments:    Torsemide 40mg  twice daily  Metolazone 2.5mg  Monday and Friday every week.      Otherwise continue the same medications as you have been doing.          We will be pursuing the following tests after your appointment today:    Have labs drawn early morning Friday 07/05/21        Orders Placed This Encounter    BASIC METABOLIC PANEL    BNP (B-TYPE NATRIURETIC PEPTI)    metOLazone (ZAROXOLYN) 2.5 mg tablet    torsemide (DEMADEX) 20 mg tablet         We will plan to see you back in 07/16/21 with Dr. 07/18/21. Follow up with Dr. Avie Arenas on 07/30/21.  Please call 13/8/22 in the meantime with any questions or concerns.        Please allow 5-7 business days for our providers to review your results. All normal results will go to MyChart. If you do not have Mychart, it is strongly recommended to get this so you can easily view all your results. If you do not have mychart, we will attempt to call you once with normal lab and testing results. If we cannot reach you by phone with normal results, we will send you a letter.  If you have not heard the results of your testing after one week please give [place] a call.       Your Cardiovascular Medicine [place] Team Gabriel Rung, Brett Canales, Pilar Jarvis, and [place])  phone number is [phone].

## 2021-07-04 ENCOUNTER — Encounter: Admit: 2021-07-04 | Discharge: 2021-07-04 | Payer: MEDICARE | Primary: Family

## 2021-07-04 DIAGNOSIS — I10 Essential (primary) hypertension: Secondary | ICD-10-CM

## 2021-07-04 DIAGNOSIS — I5033 Acute on chronic diastolic (congestive) heart failure: Secondary | ICD-10-CM

## 2021-07-04 DIAGNOSIS — I2510 Atherosclerotic heart disease of native coronary artery without angina pectoris: Secondary | ICD-10-CM

## 2021-07-04 DIAGNOSIS — I5043 Acute on chronic combined systolic (congestive) and diastolic (congestive) heart failure: Secondary | ICD-10-CM

## 2021-07-04 DIAGNOSIS — Z952 Presence of prosthetic heart valve: Secondary | ICD-10-CM

## 2021-07-04 DIAGNOSIS — I4891 Unspecified atrial fibrillation: Secondary | ICD-10-CM

## 2021-07-04 LAB — BASIC METABOLIC PANEL
ANION GAP: 14 MMOL/L (ref 21–30)
BLD UREA NITROGEN: 31 mg/dL — ABNORMAL HIGH (ref 8.4–25.7)
CALCIUM: 8.9 g/dL (ref 3.5–5.0)
CHLORIDE: 99 mg/dL (ref 7–25)
POTASSIUM: 3.8 mg/dL (ref 70–100)
SODIUM: 140 MMOL/L — ABNORMAL LOW (ref 98–110)

## 2021-07-05 ENCOUNTER — Encounter: Admit: 2021-07-05 | Discharge: 2021-07-05 | Payer: MEDICARE | Primary: Family

## 2021-07-05 DIAGNOSIS — I5043 Acute on chronic combined systolic (congestive) and diastolic (congestive) heart failure: Secondary | ICD-10-CM

## 2021-07-05 DIAGNOSIS — I10 Essential (primary) hypertension: Secondary | ICD-10-CM

## 2021-07-05 DIAGNOSIS — I2510 Atherosclerotic heart disease of native coronary artery without angina pectoris: Secondary | ICD-10-CM

## 2021-07-05 DIAGNOSIS — I4891 Unspecified atrial fibrillation: Secondary | ICD-10-CM

## 2021-07-05 DIAGNOSIS — Z952 Presence of prosthetic heart valve: Secondary | ICD-10-CM

## 2021-07-05 DIAGNOSIS — I5033 Acute on chronic diastolic (congestive) heart failure: Secondary | ICD-10-CM

## 2021-07-05 LAB — BNP (B-TYPE NATRIURETIC PEPTI): BNP: 137

## 2021-07-05 NOTE — Progress Notes
Discussed lab results with WTL in clinic. He recommends patient to hold Metolazone over the weekend. Have BNP and BMP redrawn on Monday.       Attempted to call patient multiple times to inform of Dr. Imagene Gurney recommendations. Left detailed message to for patient regarding instructions. Faxed lab orders to [facility] to be drawn on Monday 07/08/21.

## 2021-07-08 ENCOUNTER — Encounter: Admit: 2021-07-08 | Discharge: 2021-07-08 | Payer: MEDICARE | Primary: Family

## 2021-07-08 DIAGNOSIS — I351 Nonrheumatic aortic (valve) insufficiency: Secondary | ICD-10-CM

## 2021-07-08 DIAGNOSIS — Z952 Presence of prosthetic heart valve: Secondary | ICD-10-CM

## 2021-07-08 NOTE — Telephone Encounter
Called patient to check if he had lab work completed. Patient states that he has not. Patient states that he also took a dose of Metolazone today.       Discussed with WTL. He recommends patient to have lab work drawn tomorrow.     Called facility and spoke with Rodney Booze, RN. Discussed WTL's recommendations. She is verbalized understanding and has no further questions at this time.     Nursing ph: [phone]  Facility fax: [phone]

## 2021-07-15 ENCOUNTER — Inpatient Hospital Stay: Admit: 2021-07-15 | Discharge: 2021-07-15 | Payer: MEDICARE | Primary: Family

## 2021-07-15 ENCOUNTER — Encounter: Admit: 2021-07-15 | Discharge: 2021-07-15 | Payer: MEDICARE | Primary: Family

## 2021-07-15 ENCOUNTER — Inpatient Hospital Stay: Admit: 2021-07-15 | Payer: MEDICARE

## 2021-07-15 DIAGNOSIS — I2510 Atherosclerotic heart disease of native coronary artery without angina pectoris: Secondary | ICD-10-CM

## 2021-07-15 DIAGNOSIS — I5043 Acute on chronic combined systolic (congestive) and diastolic (congestive) heart failure: Secondary | ICD-10-CM

## 2021-07-15 DIAGNOSIS — I4891 Unspecified atrial fibrillation: Secondary | ICD-10-CM

## 2021-07-15 DIAGNOSIS — I10 Essential (primary) hypertension: Principal | ICD-10-CM

## 2021-07-15 DIAGNOSIS — Z952 Presence of prosthetic heart valve: Secondary | ICD-10-CM

## 2021-07-15 DIAGNOSIS — I5033 Acute on chronic diastolic (congestive) heart failure: Secondary | ICD-10-CM

## 2021-07-15 DIAGNOSIS — R609 Edema, unspecified: Secondary | ICD-10-CM

## 2021-07-15 LAB — COMPREHENSIVE METABOLIC PANEL
ALBUMIN: 4 g/dL (ref 3.5–5.0)
ALK PHOSPHATASE: 72 U/L — ABNORMAL LOW (ref 25–110)
ALT: 11 U/L (ref 7–56)
ANION GAP: 12 K/UL (ref 3–12)
AST: 22 U/L (ref 7–40)
BLD UREA NITROGEN: 60 mg/dL — ABNORMAL HIGH (ref 7–25)
CALCIUM: 9.2 mg/dL — ABNORMAL HIGH (ref 8.5–10.6)
CO2: 33 MMOL/L — ABNORMAL HIGH (ref 21–30)
EGFR: 23 mL/min — ABNORMAL LOW (ref >60)
GLUCOSE,PANEL: 164 mg/dL — ABNORMAL HIGH (ref 70–100)
SODIUM: 142 MMOL/L — ABNORMAL LOW (ref 137–147)
TOTAL BILIRUBIN: 0.8 mg/dL (ref 0.3–1.2)
TOTAL PROTEIN: 7 g/dL (ref 6.0–8.0)

## 2021-07-15 LAB — PHOSPHORUS: PHOSPHORUS: 3.5 mg/dL — ABNORMAL LOW (ref 2.0–4.5)

## 2021-07-15 LAB — BNP (B-TYPE NATRIURETIC PEPTI): BNP: 266 pg/mL — ABNORMAL HIGH (ref 0–100)

## 2021-07-15 LAB — CBC AND DIFF
ABSOLUTE BASO COUNT: 0 K/UL (ref 0–0.20)
ABSOLUTE EOS COUNT: 0.1 K/UL (ref 0–0.45)
ABSOLUTE MONO COUNT: 0.9 K/UL — ABNORMAL HIGH (ref 0–0.80)
MCHC: 32 g/dL — ABNORMAL HIGH (ref 32.0–36.0)
WBC COUNT: 8.7 K/UL (ref 4.5–11.0)

## 2021-07-15 LAB — MAGNESIUM: MAGNESIUM: 2.1 mg/dL — ABNORMAL LOW (ref 1.6–2.6)

## 2021-07-15 MED ORDER — ATORVASTATIN 10 MG PO TAB
10 mg | Freq: Every day | ORAL | 0 refills | Status: AC
Start: 2021-07-15 — End: ?
  Administered 2021-07-16 – 2021-08-10 (×26): 10 mg via ORAL

## 2021-07-15 MED ORDER — TAMSULOSIN 0.4 MG PO CAP
.4 mg | Freq: Every day | ORAL | 0 refills | Status: AC
Start: 2021-07-15 — End: ?
  Administered 2021-07-16 – 2021-08-10 (×17): 0.4 mg via ORAL

## 2021-07-15 MED ORDER — PANTOPRAZOLE 20 MG PO TBEC
20 mg | Freq: Every day | ORAL | 0 refills | Status: AC
Start: 2021-07-15 — End: ?
  Administered 2021-07-16 – 2021-08-10 (×25): 20 mg via ORAL

## 2021-07-15 MED ORDER — POTASSIUM CHLORIDE 20 MEQ PO TBTQ
40 meq | Freq: Once | ORAL | 0 refills | Status: CP
Start: 2021-07-15 — End: ?
  Administered 2021-07-16: 04:00:00 40 meq via ORAL

## 2021-07-15 MED ORDER — IMS MIXTURE TEMPLATE
150 mg | Freq: Every day | ORAL | 0 refills | Status: AC
Start: 2021-07-15 — End: ?
  Administered 2021-07-16 – 2021-07-24 (×18): 150 mg via ORAL

## 2021-07-15 MED ORDER — SENNOSIDES-DOCUSATE SODIUM 8.6-50 MG PO TAB
1 | Freq: Every day | ORAL | 0 refills | Status: AC | PRN
Start: 2021-07-15 — End: ?
  Administered 2021-07-17: 19:00:00 1 via ORAL

## 2021-07-15 MED ORDER — SERTRALINE 50 MG PO TAB
50 mg | Freq: Every day | ORAL | 0 refills | Status: AC
Start: 2021-07-15 — End: ?
  Administered 2021-07-16 – 2021-08-10 (×26): 50 mg via ORAL

## 2021-07-15 MED ORDER — DIGOXIN 125 MCG (0.125 MG) PO TAB
125 ug | Freq: Every day | ORAL | 0 refills | Status: AC
Start: 2021-07-15 — End: ?

## 2021-07-15 MED ORDER — DICLOFENAC SODIUM 1 % TP GEL
2 g | Freq: Four times a day (QID) | TOPICAL | 0 refills | Status: AC
Start: 2021-07-15 — End: ?
  Administered 2021-07-16: 04:00:00 2 g via TOPICAL

## 2021-07-15 MED ORDER — HEPARIN, PORCINE (PF) 5,000 UNIT/0.5 ML IJ SYRG
5000 [IU] | SUBCUTANEOUS | 0 refills | Status: DC
Start: 2021-07-15 — End: 2021-07-16

## 2021-07-15 MED ORDER — APIXABAN 5 MG PO TAB
5 mg | Freq: Two times a day (BID) | ORAL | 0 refills | Status: AC
Start: 2021-07-15 — End: ?
  Administered 2021-07-16 – 2021-07-24 (×17): 5 mg via ORAL

## 2021-07-15 MED ORDER — POLYETHYLENE GLYCOL 3350 17 GRAM PO PWPK
1 | Freq: Every day | ORAL | 0 refills | Status: AC | PRN
Start: 2021-07-15 — End: ?
  Administered 2021-07-17: 19:00:00 17 g via ORAL

## 2021-07-15 MED ORDER — MELATONIN 5 MG PO TAB
5 mg | Freq: Every evening | ORAL | 0 refills | Status: AC | PRN
Start: 2021-07-15 — End: ?
  Administered 2021-07-21 – 2021-08-07 (×8): 5 mg via ORAL

## 2021-07-15 MED ORDER — LEVOTHYROXINE 75 MCG PO TAB
75 ug | Freq: Every day | ORAL | 0 refills | Status: AC
Start: 2021-07-15 — End: ?
  Administered 2021-07-16 – 2021-08-10 (×26): 75 ug via ORAL

## 2021-07-15 MED ORDER — IMS MIXTURE TEMPLATE
750 mg | Freq: Every evening | ORAL | 0 refills | Status: AC
Start: 2021-07-15 — End: ?
  Administered 2021-07-16 – 2021-08-10 (×49): 750 mg via ORAL

## 2021-07-15 MED ORDER — BUMETANIDE 0.25 MG/ML IJ SOLN
2 mg | Freq: Two times a day (BID) | INTRAVENOUS | 0 refills | Status: AC
Start: 2021-07-15 — End: ?
  Administered 2021-07-16: 04:00:00 2 mg via INTRAVENOUS

## 2021-07-15 NOTE — Telephone Encounter
Discussed lab results with Dr. Sandria Manly. He recommends for patient to be admitted.    Called and spoke with Denny Peon, Charity fundraiser at [facility] in [place]. She states that they can take patient to [facility] ER.

## 2021-07-15 NOTE — Progress Notes
75 yr old Male    Came in  because Cardiology Clinic told him to come in    No CP   EKG without changes      Saw Dr. Sandria Manly on 10/12 diuretics,   Aortic Valve regurg, think may have to do something with that.  No acute symptoms, legs mildly swollen.    Imaging: CXR Cardiomegaly and mild CHF    Labs:Cr 2.9 up from 1.9  BUN 55 up from 30 K 3.4 Gluc 148 Hgb 11.4 WBC 9.4    Tni 0.137     Meds:No meds there.    Vitals:RA 91% 133/44  60  21  97.8  Weight 129.7 KG.    Procedure:    Hx:    Reason for transfer: Current Patient --> Cardiology HF, Dr. Sandria Manly

## 2021-07-15 NOTE — Progress Notes
Cardiology Rounding RN  Annice Pih DiGiovanni:    Patient discussed.  Will have to go to IM and consult Cardiology.

## 2021-07-16 ENCOUNTER — Inpatient Hospital Stay: Admit: 2021-07-16 | Discharge: 2021-07-16 | Payer: MEDICARE | Primary: Family

## 2021-07-16 MED ADMIN — ACETAMINOPHEN 325 MG PO TAB [101]: 650 mg | ORAL | @ 21:00:00 | NDC 00904677361

## 2021-07-16 MED ADMIN — BUMETANIDE 0.25 MG/ML IJ SOLN [9308]: 1 mg/h | INTRAVENOUS | @ 19:00:00 | NDC 00641600701

## 2021-07-16 MED ADMIN — LORATADINE 10 MG PO TAB [10466]: 10 mg | ORAL | @ 06:00:00 | NDC 00904685261

## 2021-07-16 MED ADMIN — PERFLUTREN LIPID MICROSPHERES 1.1 MG/ML IV SUSP [79178]: 2.5 mL | INTRAVENOUS | @ 17:00:00 | Stop: 2021-07-16 | NDC 11994001116

## 2021-07-16 MED ADMIN — BUMETANIDE 0.25 MG/ML IJ SOLN [9308]: 4 mg | INTRAVENOUS | @ 19:00:00 | Stop: 2021-07-16 | NDC 00409141234

## 2021-07-16 MED ADMIN — POTASSIUM CHLORIDE 20 MEQ PO TBTQ [35943]: 30 meq | ORAL | @ 11:00:00 | Stop: 2021-07-16 | NDC 00832532511

## 2021-07-16 MED ADMIN — FUROSEMIDE 10 MG/ML IJ SOLN [3291]: 80 mg | INTRAVENOUS | @ 13:00:00 | Stop: 2021-07-16 | NDC 70860030242

## 2021-07-16 MED ADMIN — POTASSIUM CHLORIDE 10 MEQ PO TBTQ [35942]: 30 meq | ORAL | @ 11:00:00 | Stop: 2021-07-16 | NDC 00245531789
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
## 2021-07-17 ENCOUNTER — Inpatient Hospital Stay: Admit: 2021-07-17 | Discharge: 2021-07-17 | Payer: MEDICARE | Primary: Family

## 2021-07-17 MED ADMIN — BUMETANIDE 0.25 MG/ML IJ SOLN [9308]: 1 mg/h | INTRAVENOUS | @ 06:00:00 | NDC 70860040641

## 2021-07-17 MED ADMIN — MAGNESIUM SULFATE IN D5W 1 GRAM/100 ML IV PGBK [166578]: 1 g | INTRAVENOUS | @ 05:00:00 | Stop: 2021-07-17 | NDC 00409672723

## 2021-07-17 MED ADMIN — CHLOROTHIAZIDE SODIUM 500 MG IV SOLR [81318]: 500 mg | INTRAVENOUS | @ 17:00:00 | Stop: 2021-07-17 | NDC 17478041940

## 2021-07-17 MED ADMIN — WATER FOR INJECTION, STERILE IJ SOLN [79513]: 20 mL | INTRAVENOUS | @ 17:00:00 | Stop: 2021-07-17 | NDC 63323018508

## 2021-07-17 MED ADMIN — SODIUM CHLORIDE 0.9 % IV SOLP [27838]: 300 mg | INTRAVENOUS | @ 15:00:00 | Stop: 2021-07-20 | NDC 00338004902

## 2021-07-17 MED ADMIN — POTASSIUM CHLORIDE 20 MEQ PO TBTQ [35943]: 30 meq | ORAL | @ 14:00:00 | Stop: 2021-07-17 | NDC 00832532511

## 2021-07-17 MED ADMIN — POTASSIUM CHLORIDE 20 MEQ PO TBTQ [35943]: 20 meq | ORAL | @ 23:00:00 | Stop: 2021-07-17 | NDC 00832532511

## 2021-07-17 MED ADMIN — IRON SUCROSE 100 MG IRON/5 ML IV SOLN GROUP [280019]: 300 mg | INTRAVENOUS | @ 15:00:00 | Stop: 2021-07-20 | NDC 00517234001

## 2021-07-17 MED ADMIN — BUMETANIDE 0.25 MG/ML IJ SOLN [9308]: 1 mg/h | INTRAVENOUS | @ 15:00:00 | NDC 00641600701

## 2021-07-17 MED ADMIN — MAGNESIUM SULFATE IN D5W 1 GRAM/100 ML IV PGBK [166578]: 1 g | INTRAVENOUS | Stop: 2021-07-17 | NDC 00338170940

## 2021-07-17 MED ADMIN — ACETAMINOPHEN 325 MG PO TAB [101]: 650 mg | ORAL | @ 17:00:00 | NDC 00904677361

## 2021-07-17 MED ADMIN — BUMETANIDE 0.25 MG/ML IJ SOLN [9308]: 1 mg/h | INTRAVENOUS | @ 23:00:00 | NDC 00641600701

## 2021-07-17 MED ADMIN — ACETAMINOPHEN 325 MG PO TAB [101]: 650 mg | ORAL | @ 09:00:00 | NDC 00904677361

## 2021-07-18 MED ADMIN — POTASSIUM CHLORIDE 20 MEQ PO TBTQ [35943]: 30 meq | ORAL | @ 22:00:00 | Stop: 2021-07-18 | NDC 00832532511

## 2021-07-18 MED ADMIN — POTASSIUM CHLORIDE 10 MEQ PO TBTQ [35942]: 30 meq | ORAL | @ 22:00:00 | Stop: 2021-07-18 | NDC 00245531789

## 2021-07-18 MED ADMIN — BUMETANIDE 0.25 MG/ML IJ SOLN [9308]: 1 mg/h | INTRAVENOUS | @ 12:00:00 | NDC 00641600701

## 2021-07-18 MED ADMIN — POTASSIUM CHLORIDE 20 MEQ PO TBTQ [35943]: 40 meq | ORAL | @ 13:00:00 | Stop: 2021-07-18 | NDC 00832532511

## 2021-07-18 MED ADMIN — WATER FOR INJECTION, STERILE IJ SOLN [79513]: 20 mL | INTRAVENOUS | @ 17:00:00 | Stop: 2021-07-18 | NDC 63323018508

## 2021-07-18 MED ADMIN — ACETAMINOPHEN 325 MG PO TAB [101]: 650 mg | ORAL | @ 03:00:00 | NDC 00904677361

## 2021-07-18 MED ADMIN — IRON SUCROSE 100 MG IRON/5 ML IV SOLN GROUP [280019]: 300 mg | INTRAVENOUS | @ 13:00:00 | Stop: 2021-07-20 | NDC 00517234001

## 2021-07-18 MED ADMIN — CHLOROTHIAZIDE SODIUM 500 MG IV SOLR [81318]: 500 mg | INTRAVENOUS | @ 17:00:00 | Stop: 2021-07-18 | NDC 17478041940

## 2021-07-18 MED ADMIN — ACETAMINOPHEN 325 MG PO TAB [101]: 650 mg | ORAL | @ 18:00:00 | NDC 00904677361

## 2021-07-18 MED ADMIN — SODIUM CHLORIDE 0.9 % IV SOLP [27838]: 300 mg | INTRAVENOUS | @ 13:00:00 | Stop: 2021-07-20 | NDC 00338004902

## 2021-07-18 MED ADMIN — BUMETANIDE 0.25 MG/ML IJ SOLN [9308]: 1 mg/h | INTRAVENOUS | @ 20:00:00 | NDC 00641600701

## 2021-07-18 MED ADMIN — ACETAMINOPHEN 325 MG PO TAB [101]: 650 mg | ORAL | @ 11:00:00 | NDC 00904677361

## 2021-07-19 MED ADMIN — CHLOROTHIAZIDE SODIUM 500 MG IV SOLR [81318]: 500 mg | INTRAVENOUS | @ 13:00:00 | Stop: 2021-07-19 | NDC 25021030520

## 2021-07-19 MED ADMIN — BUMETANIDE 0.25 MG/ML IJ SOLN [9308]: 1 mg/h | INTRAVENOUS | @ 07:00:00 | NDC 00641616201

## 2021-07-19 MED ADMIN — BUMETANIDE 0.25 MG/ML IJ SOLN [9308]: 1 mg/h | INTRAVENOUS | @ 20:00:00 | NDC 70860040641

## 2021-07-19 MED ADMIN — ACETAMINOPHEN 325 MG PO TAB [101]: 650 mg | ORAL | @ 23:00:00 | NDC 00536132710

## 2021-07-19 MED ADMIN — WATER FOR INJECTION, STERILE IJ SOLN [79513]: 20 mL | INTRAVENOUS | @ 13:00:00 | Stop: 2021-07-19 | NDC 63323018508

## 2021-07-19 MED ADMIN — POTASSIUM CHLORIDE 20 MEQ PO TBTQ [35943]: 40 meq | ORAL | @ 13:00:00 | Stop: 2021-07-19 | NDC 00832532511

## 2021-07-19 MED ADMIN — IRON SUCROSE 100 MG IRON/5 ML IV SOLN GROUP [280019]: 300 mg | INTRAVENOUS | @ 13:00:00 | Stop: 2021-07-19 | NDC 00517234001

## 2021-07-19 MED ADMIN — SODIUM CHLORIDE 0.9 % IV SOLP [27838]: 300 mg | INTRAVENOUS | @ 13:00:00 | Stop: 2021-07-19 | NDC 00338004902

## 2021-07-19 MED ADMIN — ACETAMINOPHEN 325 MG PO TAB [101]: 650 mg | ORAL | @ 02:00:00 | NDC 00536132710

## 2021-07-19 MED ADMIN — ACETAMINOPHEN 325 MG PO TAB [101]: 650 mg | ORAL | @ 13:00:00 | NDC 00536132710

## 2021-07-20 MED ADMIN — POTASSIUM CHLORIDE 20 MEQ PO TBTQ [35943]: 60 meq | ORAL | @ 18:00:00 | Stop: 2021-07-20 | NDC 00832532511

## 2021-07-20 MED ADMIN — BUMETANIDE 0.25 MG/ML IJ SOLN [9308]: 1.5 mg/h | INTRAVENOUS | @ 19:00:00 | NDC 70860040641

## 2021-07-20 MED ADMIN — POTASSIUM CHLORIDE 20 MEQ PO TBTQ [35943]: 60 meq | ORAL | @ 13:00:00 | Stop: 2021-07-20 | NDC 00832532511

## 2021-07-20 MED ADMIN — CHLOROTHIAZIDE SODIUM 500 MG IV SOLR [81318]: 500 mg | INTRAVENOUS | @ 18:00:00 | Stop: 2021-07-20 | NDC 17478041940

## 2021-07-20 MED ADMIN — ACETAMINOPHEN 325 MG PO TAB [101]: 650 mg | ORAL | @ 05:00:00 | NDC 00536132710

## 2021-07-20 MED ADMIN — WATER FOR INJECTION, STERILE IJ SOLN [79513]: 20 mL | INTRAVENOUS | @ 18:00:00 | Stop: 2021-07-20 | NDC 63323018508

## 2021-07-20 MED ADMIN — BUMETANIDE 0.25 MG/ML IJ SOLN [9308]: 1 mg/h | INTRAVENOUS | @ 10:00:00 | NDC 70860040641

## 2021-07-21 MED ADMIN — ACETAZOLAMIDE SODIUM 500 MG IJ SOLR [114]: 500 mg | INTRAVENOUS | @ 17:00:00 | NDC 23155031331

## 2021-07-21 MED ADMIN — ALLOPURINOL 100 MG PO TAB [310]: 100 mg | ORAL | @ 17:00:00 | NDC 00904704161

## 2021-07-21 MED ADMIN — WATER FOR INJECTION, STERILE IJ SOLN [79513]: 20 mL | INTRAVENOUS | @ 23:00:00 | Stop: 2021-07-21 | NDC 63323018508

## 2021-07-21 MED ADMIN — ACETAMINOPHEN 325 MG PO TAB [101]: 650 mg | ORAL | @ 03:00:00 | NDC 00904677361

## 2021-07-21 MED ADMIN — BUMETANIDE 0.25 MG/ML IJ SOLN [9308]: 1.5 mg/h | INTRAVENOUS | @ 12:00:00 | NDC 70860040641

## 2021-07-21 MED ADMIN — ACETAZOLAMIDE SODIUM 500 MG IJ SOLR [114]: 500 mg | INTRAVENOUS | @ 23:00:00 | NDC 23155031331

## 2021-07-21 MED ADMIN — POTASSIUM CHLORIDE 20 MEQ PO TBTQ [35943]: 40 meq | ORAL | @ 07:00:00 | Stop: 2021-07-21 | NDC 00832532511

## 2021-07-21 MED ADMIN — BUMETANIDE 0.25 MG/ML IJ SOLN [9308]: 1.5 mg/h | INTRAVENOUS | @ 20:00:00 | NDC 00641616201

## 2021-07-21 MED ADMIN — BUMETANIDE 0.25 MG/ML IJ SOLN [9308]: 1.5 mg/h | INTRAVENOUS | @ 03:00:00 | NDC 70860040641

## 2021-07-21 MED ADMIN — POTASSIUM CHLORIDE 20 MEQ PO TBTQ [35943]: 60 meq | ORAL | @ 12:00:00 | Stop: 2021-07-21 | NDC 00832532511

## 2021-07-22 ENCOUNTER — Inpatient Hospital Stay: Admit: 2021-07-22 | Discharge: 2021-07-22 | Payer: MEDICARE | Primary: Family

## 2021-07-22 ENCOUNTER — Encounter: Admit: 2021-07-22 | Discharge: 2021-07-22 | Payer: MEDICARE | Primary: Family

## 2021-07-22 DIAGNOSIS — R011 Cardiac murmur, unspecified: Secondary | ICD-10-CM

## 2021-07-22 DIAGNOSIS — E781 Pure hyperglyceridemia: Secondary | ICD-10-CM

## 2021-07-22 DIAGNOSIS — E669 Obesity, unspecified: Secondary | ICD-10-CM

## 2021-07-22 DIAGNOSIS — N401 Enlarged prostate with lower urinary tract symptoms: Secondary | ICD-10-CM

## 2021-07-22 DIAGNOSIS — I350 Nonrheumatic aortic (valve) stenosis: Secondary | ICD-10-CM

## 2021-07-22 DIAGNOSIS — Z720 Tobacco use: Secondary | ICD-10-CM

## 2021-07-22 DIAGNOSIS — M109 Gout, unspecified: Secondary | ICD-10-CM

## 2021-07-22 DIAGNOSIS — I38 Endocarditis, valve unspecified: Secondary | ICD-10-CM

## 2021-07-22 DIAGNOSIS — E785 Hyperlipidemia, unspecified: Secondary | ICD-10-CM

## 2021-07-22 DIAGNOSIS — I4891 Unspecified atrial fibrillation: Secondary | ICD-10-CM

## 2021-07-22 DIAGNOSIS — M179 Osteoarthritis of knee, unspecified: Secondary | ICD-10-CM

## 2021-07-22 MED ORDER — LIDOCAINE (PF) 20 MG/ML (2 %) IJ SOLN
INTRAVENOUS | 0 refills | Status: DC
Start: 2021-07-22 — End: 2021-07-22
  Administered 2021-07-22: 22:00:00 20 mg via INTRAVENOUS

## 2021-07-22 MED ORDER — PROPOFOL INJ 10 MG/ML IV VIAL
INTRAVENOUS | 0 refills | Status: DC
Start: 2021-07-22 — End: 2021-07-22
  Administered 2021-07-22: 22:00:00 20 mg via INTRAVENOUS

## 2021-07-22 MED ORDER — SODIUM CHLORIDE 0.9 % IV SOLP (OR) 500ML
INTRAVENOUS | 0 refills | Status: DC
Start: 2021-07-22 — End: 2021-07-22
  Administered 2021-07-22: 22:00:00 via INTRAVENOUS

## 2021-07-22 MED ORDER — PROPOFOL 10 MG/ML IV EMUL 20 ML (INFUSION)(AM)(OR)
INTRAVENOUS | 0 refills | Status: DC
Start: 2021-07-22 — End: 2021-07-22
  Administered 2021-07-22: 22:00:00 100 ug/kg/min via INTRAVENOUS

## 2021-07-22 MED ADMIN — ALLOPURINOL 100 MG PO TAB [310]: 100 mg | ORAL | @ 01:00:00 | NDC 00904704161

## 2021-07-22 MED ADMIN — BUMETANIDE 0.25 MG/ML IJ SOLN [9308]: 1.5 mg/h | INTRAVENOUS | @ 01:00:00 | NDC 00641616201

## 2021-07-22 MED ADMIN — ACETAZOLAMIDE SODIUM 500 MG IJ SOLR [114]: 500 mg | INTRAVENOUS | NDC 23155031331

## 2021-07-22 MED ADMIN — WATER FOR INJECTION, STERILE IJ SOLN [79513]: 20 mL | INTRAVENOUS | Stop: 2021-07-22 | NDC 63323018508

## 2021-07-22 MED ADMIN — ALLOPURINOL 100 MG PO TAB [310]: 100 mg | ORAL | @ 14:00:00 | NDC 00904704161

## 2021-07-22 MED ADMIN — BUMETANIDE 0.25 MG/ML IJ SOLN [9308]: 1.5 mg/h | INTRAVENOUS | @ 10:00:00 | NDC 70860040641

## 2021-07-22 MED ADMIN — POTASSIUM CHLORIDE 20 MEQ PO TBTQ [35943]: 40 meq | ORAL | @ 14:00:00 | Stop: 2021-07-22 | NDC 00832532511

## 2021-07-22 MED ADMIN — BUMETANIDE 0.25 MG/ML IJ SOLN [9308]: 1.5 mg/h | INTRAVENOUS | @ 18:00:00 | NDC 70860040641

## 2021-07-22 MED ADMIN — ACETAMINOPHEN 325 MG PO TAB [101]: 650 mg | ORAL | @ 01:00:00 | NDC 00536132710

## 2021-07-22 MED ADMIN — WATER FOR INJECTION, STERILE IJ SOLN [79513]: 20 mL | INTRAVENOUS | @ 10:00:00 | Stop: 2021-07-22 | NDC 63323018508

## 2021-07-22 MED ADMIN — ACETAZOLAMIDE SODIUM 500 MG IJ SOLR [114]: 500 mg | INTRAVENOUS | @ 10:00:00 | NDC 23155031331

## 2021-07-22 NOTE — Anesthesia Pre-Procedure Evaluation
Anesthesia Pre-Procedure Evaluation    Name: Jacob Dickerson      MRN: [id number]     DOB: 1946/02/26     Age: 75 y.o.     Sex: male   _________________________________________________________________________     Procedure Info:   Procedure Information     Date/Time: 07/22/21 1445    Scheduled providers: Steele Berg, MD    Procedure: TRANSESOPHAGEAL ECHO    Location: Cardiology:[facility]          Physical Assessment  Vital Signs (last filed in past 24 hours):  BP: 139/46 (10/31 1521)  Temp: 36.6 ?C (97.9 ?F) (10/31 1155)  Pulse: 66 (10/31 1521)  Respirations: 18 PER MINUTE (10/31 1521)  SpO2: 98 % (10/31 1521)  O2 Device: Nasal cannula (10/31 1521)  O2 Liter Flow: 2 Lpm (10/31 1521)  Weight: 123 kg (271 lb 3.2 oz) (10/31 0500)      Patient History   Allergies   Allergen Reactions   ? Hydrochlorothiazide NAUSEA AND VOMITING and SEE COMMENTS     Allergy recorded in SMS: HYDROCHLOROTHIA~Reactions: NAUSEA, causes gout   ? Niacin EDEMA     Eye swelling, it bothered my eyes  , Reaction: Unknown        Current Medications    Medication Directions   acetaminophen (TYLENOL EXTRA STRENGTH) 500 mg tablet Take 500 mg by mouth every 4 hours as needed for Pain. Max of 4,000 mg of acetaminophen in 24 hours.   allopurinol (ZYLOPRIM) 100 mg tablet Take 1 Tab by mouth twice daily.   aluminum & magnesium hydroxide/simethicone (MAG-AL PLUS XS) 400/400/40 mg/5 mL suspension Take 15 mL by mouth every 6 hours as needed.   apixaban (ELIQUIS) 5 mg tablet Take 5 mg by mouth twice daily.   atorvastatin (LIPITOR) 10 mg tablet Take 1 Tab by mouth daily.   diclofenac sodium (VOLTAREN) 1 % topical gel Apply  topically to affected area four times daily.   digoxin (LANOXIN) 125 mcg (0.125 mg) tablet Take 1 tablet by mouth daily.   dilTIAZem XC (TIAZAC) 120 mg capsule Take 1 capsule by mouth at bedtime daily.   dilTIAZem XC (TIAZAC) 240 mg capsule Take 240 mg by mouth every morning.   divalproex (DEPAKOTE EC) 250 mg DR tablet Take 750 mg by mouth at bedtime daily. Take with food.   docusate (COLACE) 100 mg capsule Take 100 mg by mouth twice daily as needed.   fluticasone propionate (FLONASE) 50 mcg/actuation nasal spray, suspension Apply 2 sprays to each nostril as directed daily. Shake bottle gently before using.   ibuprofen (MOTRIN) 400 mg tablet Take 400 mg by mouth every 4 hours as needed for Pain. Take with food.   levothyroxine (SYNTHROID) 75 mcg tablet Take 75 mcg by mouth daily 30 minutes before breakfast.   lisinopril (ZESTRIL) 10 mg tablet Take one tablet by mouth daily. Hold until instructed to resume by cardiology.  Patient not taking: No sig reported   magnesium hydroxide (MILK OF MAGNESIA PO) Take 30 mL by mouth as Needed.   metOLazone (ZAROXOLYN) 2.5 mg tablet Take one tablet by mouth twice weekly. Monday and Friday every week   metoprolol XL (TOPROL XL) 100 mg extended release tablet Take 1.5 tablets by mouth daily.  Patient not taking: Reported on 07/19/2021   neomycin/polymyxin/gramicidin 1.75 mg-10,000 unit-0.025mg /mL ophthalmic sol Place  into or around eye(s) daily.   omeprazole DR (PRILOSEC) 40 mg capsule Take 40 mg by mouth daily.   potassium citrate  99 mg cap Take 1 capsule by mouth daily.   sertraline (ZOLOFT) 50 mg tablet Take 50 mg by mouth daily.   tamsulosin (FLOMAX) 0.4 mg capsule Take 0.4 mg by mouth daily. Do not crush, chew or open capsules. Take 30 minutes following the same meal each day.   torsemide (DEMADEX) 20 mg tablet Take two tablets by mouth twice daily.  Patient taking differently: Take 20 mg by mouth twice daily.         Review of Systems/Medical History      Patient summary reviewed  Nursing notes reviewed  Pertinent labs reviewed    PONV Screening: Non-smoker  No history of anesthetic complications  No family history of anesthetic complications        Pulmonary       Not a current smoker        No indications/hx of asthma    no COPD      No recent URI      3L of O2 since being hospitalized d/t CHF      Cardiovascular       Recent diagnostic studies:          echocardiogram      Exercise tolerance: >4 METS      Beta Blocker therapy: No      Beta blockers within 24 hours: n/a      Hypertension (Diltiazem),         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      Coronary artery disease        Dysrhythmias; atrial fibrillation      CHF      Hyperlipidemia (Statin)      Admitted with acute on chronic HF      GI/Hepatic/Renal         No hx of liver disease     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)      Neuro/Psych       No seizures      No CVA        Psychiatric history          Depression      Musculoskeletal         Arthritis      Endocrine/Other         Hypothyroidism      Obesity      Gout    Constitution - negative   Physical Exam    Airway Findings      Mallampati: III      TM distance: >3 FB      Neck ROM: full      Mouth opening: good      Airway patency: adequate    Dental Findings:       Increased risk for dental injury; pt advised          Cardiovascular Findings:       Rhythm: irregular      Rate: normal      Other findings: murmur    Pulmonary Findings:       Breath sounds clear to auscultation.    Abdominal Findings:       Obese    Neurological Findings:       Alert and oriented x 3    Constitutional findings:       No acute distress       2D ECHO 05/29/21:  1. Moderate LV dilation, LVIDD 6.6 cm, LV volume index 88 mL/m? with preserved LVEF of  66%  2. Mild RV dilation with preserved RV systolic function  ? Bioprosthetic aortic valve 27 mm Thermo fix, April 2009  1. Moderate aortic regurgitation, paravalvular, origin 1 o'clock position, adjacent to the right coronary cusps, pressure half-time 250 ms  2. Mild bioprosthetic aortic stenosis, mean gradient of 22.8 mmHg, peak velocity of 3.1 m/s  3. Mild MR, echodensity attached to mitral annulus, mobile, moves back-and-forth both in LV and LA cavity during diastole and systole respectively, differential diagnosis mitral annular calcification versus ruptured chordae  4. Markedly elevated CVP> 15 mmHg & PA systolic pressure 49 mmHg.    5. 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  Compared to prior study dated 04/23/2021, no significant interval changes are noted in aortic regurgitation severity, LVEF however echodensity on mitral annulus is well visualized  due to better visualization of, mitral valve in the current study.  CVP was 8 mmHg previously.      Diagnostic Tests  Hematology:   Lab Results   Component Value Date    HGB 11.8 07/22/2021    HCT 36.4 07/22/2021    PLTCT 216 07/22/2021    WBC 8.0 07/22/2021    NEUT 65 07/22/2021    ANC 5.26 07/22/2021    ALC 1.58 07/22/2021    MONA 12 07/22/2021    AMC 0.96 07/22/2021    EOSA 2 07/22/2021    ABC 0.05 07/22/2021    MCV 95.1 07/22/2021    MCH 30.9 07/22/2021    MCHC 32.5 07/22/2021    MPV 9.4 07/22/2021    RDW 17.0 07/22/2021         General Chemistry:   Lab Results   Component Value Date    NA 146 07/22/2021    K 3.5 07/22/2021    CL 95 07/22/2021    CO2 37 07/22/2021    GAP 14 07/22/2021    BUN 84 07/22/2021    CR 2.65 07/22/2021    GLU 91 07/22/2021    GLU 88 08/27/2011    CA 9.2 07/22/2021    ALBUMIN 3.6 07/17/2021    OBSCA 1.14 12/30/2007    MG 2.2 07/22/2021    TOTBILI 0.7 07/17/2021    PO4 3.5 07/15/2021      Coagulation:   Lab Results   Component Value Date    PTT 24.9 12/29/2007    INR 1.1 12/30/2007         Anesthesia Plan    ASA score: 4   Plan: MAC  Induction method: intravenous  NPO status: acceptable      Informed Consent  Anesthetic plan and risks discussed with patient.        Plan discussed with: anesthesiologist and CRNA.

## 2021-07-22 NOTE — Anesthesia Post-Procedure Evaluation
Post-Anesthesia Evaluation    Name: Jacob Dickerson      MRN: [id number]     DOB: [birth year]     Age: 75 y.o.     Sex: male   __________________________________________________________________________     Procedure Information     Anesthesia Start Date/Time: 07/22/21 1640    Scheduled providers: Adair Laundry, MD    Procedure: TRANSESOPHAGEAL ECHO    Location: Cardiology:[facility]          Post-Anesthesia Vitals  BP: (P) 119/40 (10/31 1801)  Pulse: (P) 78 (10/31 1801)  Respirations: (P) 16 PER MINUTE (10/31 1801)  SpO2: (P) 95 % (10/31 1801)  O2 Device: (P) Nasal cannula (10/31 1801)   Vitals Value Taken Time   BP     Temp     Pulse     Respirations     SpO2     O2 Device     ABP     ART BP     Vitals shown include unvalidated device data.      Post Anesthesia Evaluation Note    Evaluation location: Pre/Post  Patient participation: recovered; patient participated in evaluation  Level of consciousness: alert    Pain score: 0  Pain management: adequate    Hydration: normovolemia  Temperature: 36.0C - 38.4C  Airway patency: adequate    Perioperative Events       Post-op nausea and vomiting: no PONV    Postoperative Status  Cardiovascular status: hemodynamically stable  Respiratory status: supplemental oxygen (spontaneous)  Follow-up needed: none        Perioperative Events  There were no known complications for this encounter.

## 2021-07-23 ENCOUNTER — Inpatient Hospital Stay: Admit: 2021-07-23 | Discharge: 2021-07-23 | Payer: MEDICARE | Primary: Family

## 2021-07-23 ENCOUNTER — Encounter: Admit: 2021-07-23 | Discharge: 2021-07-23 | Payer: MEDICARE | Primary: Family

## 2021-07-23 DIAGNOSIS — Z720 Tobacco use: Secondary | ICD-10-CM

## 2021-07-23 DIAGNOSIS — M179 Osteoarthritis of knee, unspecified: Secondary | ICD-10-CM

## 2021-07-23 DIAGNOSIS — M109 Gout, unspecified: Secondary | ICD-10-CM

## 2021-07-23 DIAGNOSIS — I350 Nonrheumatic aortic (valve) stenosis: Secondary | ICD-10-CM

## 2021-07-23 DIAGNOSIS — I4891 Unspecified atrial fibrillation: Secondary | ICD-10-CM

## 2021-07-23 DIAGNOSIS — I38 Endocarditis, valve unspecified: Secondary | ICD-10-CM

## 2021-07-23 DIAGNOSIS — E669 Obesity, unspecified: Secondary | ICD-10-CM

## 2021-07-23 DIAGNOSIS — E785 Hyperlipidemia, unspecified: Secondary | ICD-10-CM

## 2021-07-23 DIAGNOSIS — N401 Enlarged prostate with lower urinary tract symptoms: Secondary | ICD-10-CM

## 2021-07-23 DIAGNOSIS — E781 Pure hyperglyceridemia: Secondary | ICD-10-CM

## 2021-07-23 DIAGNOSIS — R011 Cardiac murmur, unspecified: Secondary | ICD-10-CM

## 2021-07-23 MED ADMIN — BUMETANIDE 0.25 MG/ML IJ SOLN [9308]: 1.5 mg/h | INTRAVENOUS | @ 13:00:00 | NDC 00641600701

## 2021-07-23 MED ADMIN — ACETAMINOPHEN 325 MG PO TAB [101]: 650 mg | ORAL | @ 06:00:00 | NDC 00904677361

## 2021-07-23 MED ADMIN — WATER FOR INJECTION, STERILE IJ SOLN [79513]: 20 mL | INTRAVENOUS | @ 22:00:00 | Stop: 2021-07-23 | NDC 63323018508

## 2021-07-23 MED ADMIN — ALLOPURINOL 100 MG PO TAB [310]: 100 mg | ORAL | @ 13:00:00 | NDC 00904704161

## 2021-07-23 MED ADMIN — BUMETANIDE 0.25 MG/ML IJ SOLN [9308]: 1.5 mg/h | INTRAVENOUS | @ 04:00:00 | NDC 70860040641

## 2021-07-23 MED ADMIN — ACETAZOLAMIDE SODIUM 500 MG IJ SOLR [114]: 500 mg | INTRAVENOUS | @ 11:00:00 | NDC 23155031331

## 2021-07-23 MED ADMIN — POTASSIUM CHLORIDE 20 MEQ PO TBTQ [35943]: 40 meq | ORAL | @ 13:00:00 | Stop: 2021-07-23 | NDC 00832532511

## 2021-07-23 MED ADMIN — ALLOPURINOL 100 MG PO TAB [310]: 100 mg | ORAL | @ 03:00:00 | NDC 00904704161

## 2021-07-23 MED ADMIN — BUMETANIDE 0.25 MG/ML IJ SOLN [9308]: 1.5 mg/h | INTRAVENOUS | @ 04:00:00 | NDC 00641600701

## 2021-07-23 MED ADMIN — ACETAZOLAMIDE SODIUM 500 MG IJ SOLR [114]: 500 mg | INTRAVENOUS | @ 22:00:00 | NDC 23155031331

## 2021-07-23 MED ADMIN — BUMETANIDE 0.25 MG/ML IJ SOLN [9308]: 1.5 mg/h | INTRAVENOUS | @ 22:00:00 | NDC 00641600701

## 2021-07-24 ENCOUNTER — Inpatient Hospital Stay: Admit: 2021-07-24 | Discharge: 2021-07-24 | Payer: MEDICARE | Primary: Family

## 2021-07-24 ENCOUNTER — Encounter: Admit: 2021-07-24 | Discharge: 2021-07-24 | Payer: MEDICARE | Primary: Family

## 2021-07-24 MED ADMIN — BUMETANIDE 0.25 MG/ML IJ SOLN [9308]: 1.5 mg/h | INTRAVENOUS | @ 07:00:00 | NDC 00641600701

## 2021-07-24 MED ADMIN — BUMETANIDE 0.25 MG/ML IJ SOLN [9308]: 1.5 mg/h | INTRAVENOUS | @ 15:00:00 | NDC 00641600701

## 2021-07-24 MED ADMIN — ALLOPURINOL 100 MG PO TAB [310]: 100 mg | ORAL | @ 01:00:00 | NDC 00904704161

## 2021-07-24 MED ADMIN — HEPARIN (PORCINE) 10,000 UNIT/ML IJ SOLN [10177]: 1700 [IU]/h | INTRAVENOUS | @ 18:00:00 | NDC 63323054201

## 2021-07-24 MED ADMIN — SODIUM CHLORIDE 0.9 % IJ SOLN [7319]: 50 mL | INTRAVENOUS | @ 21:00:00 | Stop: 2021-07-24 | NDC 00409488850

## 2021-07-24 MED ADMIN — HEPARIN (PORCINE) 1,000 UNIT/ML IJ SOLN [10176]: 7500 [IU] | INTRAVENOUS | @ 12:00:00 | Stop: 2021-07-24 | NDC 71288040210

## 2021-07-24 MED ADMIN — ACETAZOLAMIDE SODIUM 500 MG IJ SOLR [114]: 500 mg | INTRAVENOUS | @ 11:00:00 | NDC 23155031331

## 2021-07-24 MED ADMIN — IOHEXOL 350 MG IODINE/ML IV SOLN [81210]: 100 mL | INTRAVENOUS | @ 21:00:00 | Stop: 2021-07-24 | NDC 00407141491

## 2021-07-24 MED ADMIN — POTASSIUM CHLORIDE 20 MEQ PO TBTQ [35943]: 40 meq | ORAL | @ 01:00:00 | Stop: 2021-07-24 | NDC 00832532511

## 2021-07-24 MED ADMIN — ALLOPURINOL 100 MG PO TAB [310]: 100 mg | ORAL | @ 15:00:00 | NDC 00904704161

## 2021-07-24 MED ADMIN — DEXTROSE 5% IN WATER IV SOLP [2364]: 500 mL | INTRAVENOUS | @ 19:00:00 | Stop: 2021-07-24 | NDC 00338001702

## 2021-07-24 MED ADMIN — POTASSIUM CHLORIDE 20 MEQ PO TBTQ [35943]: 40 meq | ORAL | @ 15:00:00 | Stop: 2021-07-24 | NDC 00832532511

## 2021-07-24 MED ADMIN — DEXTROSE 5% IN WATER IV SOLP [2364]: 1700 [IU]/h | INTRAVENOUS | @ 18:00:00 | NDC 00409792202

## 2021-07-24 MED ADMIN — HEPARIN (PORCINE) IN 5 % DEX 20,000 UNIT/500 ML (40 UNIT/ML) IV SOLP [3628]: 1700 [IU]/h | INTRAVENOUS | @ 12:00:00 | Stop: 2021-07-24 | NDC 00264956710

## 2021-07-24 MED ADMIN — ACETAMINOPHEN 325 MG PO TAB [101]: 650 mg | ORAL | @ 04:00:00 | NDC 00904677361

## 2021-07-25 MED ADMIN — ALLOPURINOL 100 MG PO TAB [310]: 100 mg | ORAL | @ 01:00:00 | NDC 00904704161

## 2021-07-25 MED ADMIN — SODIUM CHLORIDE 0.9 % IV SOLP [27838]: 1000 mL | INTRAVENOUS | @ 10:00:00 | Stop: 2021-07-27 | NDC 00338004904

## 2021-07-25 MED ADMIN — POTASSIUM CHLORIDE 20 MEQ PO TBTQ [35943]: 40 meq | ORAL | @ 14:00:00 | Stop: 2021-07-25 | NDC 00832532511

## 2021-07-25 MED ADMIN — METOPROLOL SUCCINATE 50 MG PO TB24 [77931]: 50 mg | ORAL | @ 14:00:00 | NDC 00904632361

## 2021-07-25 MED ADMIN — HEPARIN (PORCINE) 10,000 UNIT/ML IJ SOLN [10177]: 1300 [IU]/h | INTRAVENOUS | @ 07:00:00 | Stop: 2021-07-26 | NDC 63739096411

## 2021-07-25 MED ADMIN — ALLOPURINOL 100 MG PO TAB [310]: 100 mg | ORAL | @ 14:00:00 | NDC 00904704161

## 2021-07-25 MED ADMIN — ASPIRIN 325 MG PO TAB [681]: 325 mg | ORAL | @ 10:00:00 | Stop: 2021-07-25 | NDC 66553000101

## 2021-07-25 MED ADMIN — POTASSIUM CHLORIDE 20 MEQ PO TBTQ [35943]: 40 meq | ORAL | @ 11:00:00 | Stop: 2021-07-25 | NDC 00832532511

## 2021-07-25 MED ADMIN — DEXTROSE 5% IN WATER IV SOLP [2364]: 1300 [IU]/h | INTRAVENOUS | @ 07:00:00 | Stop: 2021-07-26 | NDC 00338001702

## 2021-07-26 ENCOUNTER — Encounter: Admit: 2021-07-26 | Discharge: 2021-07-26 | Payer: MEDICARE | Primary: Family

## 2021-07-26 MED ADMIN — HEPARIN (PORCINE) 10,000 UNIT/ML IJ SOLN [10177]: 1300 [IU]/h | INTRAVENOUS | @ 02:00:00 | Stop: 2021-07-26 | NDC 63739096411

## 2021-07-26 MED ADMIN — METOPROLOL SUCCINATE 50 MG PO TB24 [77931]: 50 mg | ORAL | @ 14:00:00 | NDC 00904632361

## 2021-07-26 MED ADMIN — ALLOPURINOL 100 MG PO TAB [310]: 100 mg | ORAL | @ 02:00:00 | NDC 00904704161

## 2021-07-26 MED ADMIN — DEXTROSE 5% IN WATER IV SOLP [2364]: 1300 [IU]/h | INTRAVENOUS | @ 02:00:00 | Stop: 2021-07-26 | NDC 00338001702

## 2021-07-26 MED ADMIN — ALLOPURINOL 100 MG PO TAB [310]: 100 mg | ORAL | @ 14:00:00 | NDC 00904704161

## 2021-07-27 MED ADMIN — HEPARIN (PORCINE) 10,000 UNIT/ML IJ SOLN [10177]: 1700 [IU]/h | INTRAVENOUS | @ 04:00:00 | Stop: 2021-07-28 | NDC 63739096411

## 2021-07-27 MED ADMIN — ACETAMINOPHEN 325 MG PO TAB [101]: 650 mg | ORAL | @ 02:00:00 | NDC 00904677361

## 2021-07-27 MED ADMIN — METOPROLOL SUCCINATE 50 MG PO TB24 [77931]: 50 mg | ORAL | @ 11:00:00 | NDC 00904632361

## 2021-07-27 MED ADMIN — ALLOPURINOL 100 MG PO TAB [310]: 100 mg | ORAL | @ 11:00:00 | NDC 00904704161

## 2021-07-27 MED ADMIN — ALLOPURINOL 100 MG PO TAB [310]: 100 mg | ORAL | @ 01:00:00 | NDC 00904704161

## 2021-07-27 MED ADMIN — TORSEMIDE 20 MG PO TAB [18293]: 40 mg | ORAL | @ 14:00:00 | Stop: 2021-07-27 | NDC 68084053911

## 2021-07-27 MED ADMIN — DEXTROSE 5% IN WATER IV SOLP [2364]: 1700 [IU]/h | INTRAVENOUS | @ 04:00:00 | Stop: 2021-07-28 | NDC 00338001702

## 2021-07-27 MED ADMIN — POTASSIUM CHLORIDE 20 MEQ PO TBTQ [35943]: 40 meq | ORAL | @ 11:00:00 | Stop: 2021-07-27 | NDC 00832532511

## 2021-07-27 MED ADMIN — BUMETANIDE 2 MG PO TAB [9311]: 2 mg | ORAL | @ 14:00:00 | NDC 50268013211

## 2021-07-27 MED ADMIN — DEXTROSE 5% IN WATER IV SOLP [2364]: 1700 [IU]/h | INTRAVENOUS | @ 13:00:00 | Stop: 2021-07-27 | NDC 00338001702

## 2021-07-27 MED ADMIN — OXYCODONE 5 MG/5 ML PO SOLN [10813]: 5 mg | ORAL | @ 06:00:00 | Stop: 2021-07-27 | NDC 00904682805

## 2021-07-27 MED ADMIN — HEPARIN (PORCINE) 10,000 UNIT/ML IJ SOLN [10177]: 1700 [IU]/h | INTRAVENOUS | @ 13:00:00 | Stop: 2021-07-27 | NDC 63739096411

## 2021-07-28 MED ADMIN — ACETAMINOPHEN 325 MG PO TAB [101]: 650 mg | ORAL | @ 02:00:00 | NDC 00904677361

## 2021-07-28 MED ADMIN — HEPARIN (PORCINE) 10,000 UNIT/ML IJ SOLN [10177]: 1700 [IU]/h | INTRAVENOUS | @ 11:00:00 | NDC 63739096411

## 2021-07-28 MED ADMIN — ALLOPURINOL 100 MG PO TAB [310]: 100 mg | ORAL | @ 02:00:00 | NDC 00904704161

## 2021-07-28 MED ADMIN — HEPARIN (PORCINE) 10,000 UNIT/ML IJ SOLN [10177]: 1700 [IU]/h | INTRAVENOUS | @ 23:00:00 | NDC 63739096411

## 2021-07-28 MED ADMIN — METOPROLOL SUCCINATE 50 MG PO TB24 [77931]: 50 mg | ORAL | @ 15:00:00 | NDC 00904632361

## 2021-07-28 MED ADMIN — DEXTROSE 5% IN WATER IV SOLP [2364]: 1700 [IU]/h | INTRAVENOUS | @ 23:00:00 | NDC 00338001702

## 2021-07-28 MED ADMIN — SODIUM CHLORIDE 0.9 % IV SOLP [27838]: 250 mL | INTRAVENOUS | @ 15:00:00 | Stop: 2021-07-28 | NDC 00338004902

## 2021-07-28 MED ADMIN — SODIUM CHLORIDE 0.9 % IV SOLP [27838]: 250 mL | INTRAVENOUS | @ 23:00:00 | Stop: 2021-07-28 | NDC 00338004902

## 2021-07-28 MED ADMIN — HEPARIN (PORCINE) 10,000 UNIT/ML IJ SOLN [10177]: 1700 [IU]/h | INTRAVENOUS | NDC 63739096411

## 2021-07-28 MED ADMIN — ALLOPURINOL 100 MG PO TAB [310]: 100 mg | ORAL | @ 15:00:00 | NDC 00904704161

## 2021-07-28 MED ADMIN — DEXTROSE 5% IN WATER IV SOLP [2364]: 1700 [IU]/h | INTRAVENOUS | @ 11:00:00 | NDC 00338001702

## 2021-07-28 MED ADMIN — DEXTROSE 5% IN WATER IV SOLP [2364]: 1700 [IU]/h | INTRAVENOUS | NDC 00338001702

## 2021-07-28 MED ADMIN — THROMBIN (BOVINE) 20,000 UNIT TP SPRY [79532]: TOPICAL | @ 22:00:00 | Stop: 2021-07-28 | NDC 60793021721

## 2021-07-29 ENCOUNTER — Encounter: Admit: 2021-07-29 | Discharge: 2021-07-29 | Payer: MEDICARE | Primary: Family

## 2021-07-29 ENCOUNTER — Inpatient Hospital Stay: Admit: 2021-07-29 | Discharge: 2021-07-29 | Payer: MEDICARE | Primary: Family

## 2021-07-29 MED ADMIN — SODIUM CHLORIDE 0.9 % IV SOLP [27838]: 250 mL | INTRAVENOUS | @ 22:00:00 | Stop: 2021-07-29 | NDC 00338004904

## 2021-07-29 MED ADMIN — SODIUM CHLORIDE 0.9 % IV SOLP [27838]: 1000.000 mL | INTRAVENOUS | @ 22:00:00 | NDC 00338004904

## 2021-07-29 MED ADMIN — LACTATED RINGERS IV SOLP [4318]: 500 mL | INTRAVENOUS | @ 18:00:00 | Stop: 2021-07-29 | NDC 00338011704

## 2021-07-29 MED ADMIN — ACETAMINOPHEN 325 MG PO TAB [101]: 650 mg | ORAL | @ 21:00:00 | NDC 00904677361

## 2021-07-29 MED ADMIN — POTASSIUM CHLORIDE 10 MEQ PO TBTQ [35942]: 10 meq | ORAL | @ 02:00:00 | Stop: 2021-07-29 | NDC 00245531789

## 2021-07-29 MED ADMIN — HEPARIN (PORCINE) 10,000 UNIT/ML IJ SOLN [10177]: 1700 [IU]/h | INTRAVENOUS | @ 10:00:00 | NDC 63739096411

## 2021-07-29 MED ADMIN — ASPIRIN 325 MG PO TAB [681]: 325 mg | ORAL | @ 12:00:00 | Stop: 2021-07-29 | NDC 66553000101

## 2021-07-29 MED ADMIN — ALLOPURINOL 100 MG PO TAB [310]: 100 mg | ORAL | @ 15:00:00 | NDC 00904704161

## 2021-07-29 MED ADMIN — ALLOPURINOL 100 MG PO TAB [310]: 100 mg | ORAL | @ 02:00:00 | NDC 00904704161

## 2021-07-29 MED ADMIN — METOPROLOL SUCCINATE 50 MG PO TB24 [77931]: 50 mg | ORAL | @ 15:00:00 | NDC 00904632361

## 2021-07-29 MED ADMIN — ACETAMINOPHEN 325 MG PO TAB [101]: 650 mg | ORAL | @ 02:00:00 | NDC 00904677361

## 2021-07-29 MED ADMIN — DEXTROSE 5% IN WATER IV SOLP [2364]: 1700 [IU]/h | INTRAVENOUS | @ 10:00:00 | NDC 00338001702

## 2021-07-30 ENCOUNTER — Encounter: Admit: 2021-07-30 | Discharge: 2021-07-30 | Payer: MEDICARE | Primary: Family

## 2021-07-30 MED ADMIN — DEXTROSE 5% IN WATER IV SOLP [2364]: 1800 [IU]/h | INTRAVENOUS | @ 22:00:00 | NDC 00338001702

## 2021-07-30 MED ADMIN — HEPARIN (PORCINE) 10,000 UNIT/ML IJ SOLN [10177]: 1800 [IU]/h | INTRAVENOUS | @ 22:00:00 | NDC 63739096411

## 2021-07-30 MED ADMIN — HEPARIN (PORCINE) 10,000 UNIT/ML IJ SOLN [10177]: 1700 [IU]/h | INTRAVENOUS | @ 12:00:00 | NDC 63739096411

## 2021-07-30 MED ADMIN — DEXTROSE 5% IN WATER IV SOLP [2364]: 1700 [IU]/h | INTRAVENOUS | @ 12:00:00 | NDC 00338001702

## 2021-07-30 MED ADMIN — METOPROLOL SUCCINATE 50 MG PO TB24 [77931]: 50 mg | ORAL | @ 17:00:00 | NDC 00904632361

## 2021-07-30 MED ADMIN — ALLOPURINOL 100 MG PO TAB [310]: 100 mg | ORAL | @ 17:00:00 | NDC 00904704161

## 2021-07-30 NOTE — Progress Notes
5 meter walk completed.      1st Trial - 12.40 seconds   2nd Trial - 11.48 seconds    3rd Trial - 11.69 seconds

## 2021-07-31 ENCOUNTER — Inpatient Hospital Stay: Admit: 2021-07-31 | Discharge: 2021-07-31 | Payer: MEDICARE | Primary: Family

## 2021-07-31 ENCOUNTER — Encounter: Admit: 2021-07-31 | Discharge: 2021-07-31 | Payer: MEDICARE | Primary: Family

## 2021-07-31 DIAGNOSIS — Z720 Tobacco use: Secondary | ICD-10-CM

## 2021-07-31 DIAGNOSIS — N401 Enlarged prostate with lower urinary tract symptoms: Secondary | ICD-10-CM

## 2021-07-31 DIAGNOSIS — I38 Endocarditis, valve unspecified: Secondary | ICD-10-CM

## 2021-07-31 DIAGNOSIS — M179 Osteoarthritis of knee, unspecified: Secondary | ICD-10-CM

## 2021-07-31 DIAGNOSIS — I4891 Unspecified atrial fibrillation: Secondary | ICD-10-CM

## 2021-07-31 DIAGNOSIS — E785 Hyperlipidemia, unspecified: Secondary | ICD-10-CM

## 2021-07-31 DIAGNOSIS — I350 Nonrheumatic aortic (valve) stenosis: Secondary | ICD-10-CM

## 2021-07-31 DIAGNOSIS — M109 Gout, unspecified: Secondary | ICD-10-CM

## 2021-07-31 DIAGNOSIS — R011 Cardiac murmur, unspecified: Secondary | ICD-10-CM

## 2021-07-31 DIAGNOSIS — E781 Pure hyperglyceridemia: Secondary | ICD-10-CM

## 2021-07-31 DIAGNOSIS — E669 Obesity, unspecified: Secondary | ICD-10-CM

## 2021-07-31 MED ADMIN — THROMBIN (BOVINE) 5,000 UNIT TP SOLR [164515]: TOPICAL | @ 23:00:00 | Stop: 2021-07-31 | NDC 60793021505

## 2021-07-31 MED ADMIN — LACTATED RINGERS IV SOLP [4318]: 500 mL | INTRAVENOUS | @ 23:00:00 | Stop: 2021-07-31 | NDC 00338011704

## 2021-07-31 MED ADMIN — HEPARIN (PORCINE) 10,000 UNIT/ML IJ SOLN [10177]: 1800 [IU]/h | INTRAVENOUS | @ 10:00:00 | NDC 63739096411

## 2021-07-31 MED ADMIN — DEXTROSE 5% IN WATER IV SOLP [2364]: 1800 [IU]/h | INTRAVENOUS | @ 10:00:00 | NDC 00338001702

## 2021-07-31 MED ADMIN — ALLOPURINOL 100 MG PO TAB [310]: 100 mg | ORAL | @ 02:00:00 | NDC 00904704161

## 2021-07-31 MED ADMIN — METOPROLOL SUCCINATE 50 MG PO TB24 [77931]: 50 mg | ORAL | @ 14:00:00 | NDC 00904632361

## 2021-07-31 MED ADMIN — LACTATED RINGERS IV SOLP [4318]: 250 mL | INTRAVENOUS | @ 16:00:00 | Stop: 2021-07-31 | NDC 00338011704

## 2021-07-31 MED ADMIN — LACTATED RINGERS IV SOLP [4318]: 500 mL | INTRAVENOUS | @ 14:00:00 | Stop: 2021-07-31 | NDC 00338011704

## 2021-07-31 MED ADMIN — ACETAMINOPHEN 325 MG PO TAB [101]: 650 mg | ORAL | @ 23:00:00 | NDC 00904677361

## 2021-08-01 MED ADMIN — ALLOPURINOL 100 MG PO TAB [310]: 100 mg | ORAL | @ 15:00:00 | NDC 00904704161

## 2021-08-01 MED ADMIN — METOPROLOL SUCCINATE 50 MG PO TB24 [77931]: 50 mg | ORAL | @ 15:00:00 | NDC 00904632361

## 2021-08-01 MED ADMIN — ALLOPURINOL 100 MG PO TAB [310]: 100 mg | ORAL | @ 03:00:00 | NDC 00904704161

## 2021-08-01 MED ADMIN — DEXTROSE 5% IN WATER IV SOLP [2364]: 1800 [IU]/h | INTRAVENOUS | @ 21:00:00 | NDC 00338001702

## 2021-08-01 MED ADMIN — DEXTROSE 5% IN WATER IV SOLP [2364]: 1800 [IU]/h | INTRAVENOUS | @ 16:00:00 | NDC 00338001702

## 2021-08-01 MED ADMIN — TORSEMIDE 20 MG PO TAB [18293]: 20 mg | ORAL | @ 22:00:00 | NDC 68084053911

## 2021-08-01 MED ADMIN — HEPARIN (PORCINE) 10,000 UNIT/ML IJ SOLN [10177]: 1800 [IU]/h | INTRAVENOUS | @ 16:00:00 | NDC 63739096411

## 2021-08-01 MED ADMIN — TORSEMIDE 20 MG PO TAB [18293]: 20 mg | ORAL | @ 15:00:00 | NDC 68084053911

## 2021-08-01 MED ADMIN — HEPARIN (PORCINE) 10,000 UNIT/ML IJ SOLN [10177]: 1800 [IU]/h | INTRAVENOUS | @ 21:00:00 | NDC 63739096411

## 2021-08-02 MED ADMIN — DEXTROSE 5% IN WATER IV SOLP [2364]: 1900 [IU]/h | INTRAVENOUS | @ 09:00:00 | Stop: 2021-08-02 | NDC 00338001702

## 2021-08-02 MED ADMIN — HEPARIN (PORCINE) 10,000 UNIT/ML IJ SOLN [10177]: 1900 [IU]/h | INTRAVENOUS | @ 09:00:00 | Stop: 2021-08-02 | NDC 63739096411

## 2021-08-02 MED ADMIN — DEXTROSE 5% IN WATER IV SOLP [2364]: 1900 [IU]/h | INTRAVENOUS | @ 21:00:00 | Stop: 2021-08-03 | NDC 00338001702

## 2021-08-02 MED ADMIN — ACETAMINOPHEN 325 MG PO TAB [101]: 650 mg | ORAL | @ 23:00:00 | NDC 00904677361

## 2021-08-02 MED ADMIN — ALLOPURINOL 100 MG PO TAB [310]: 100 mg | ORAL | @ 04:00:00 | NDC 00904704161

## 2021-08-02 MED ADMIN — TORSEMIDE 20 MG PO TAB [18293]: 20 mg | ORAL | @ 23:00:00 | NDC 68084053911

## 2021-08-02 MED ADMIN — HEPARIN (PORCINE) 10,000 UNIT/ML IJ SOLN [10177]: 1900 [IU]/h | INTRAVENOUS | @ 21:00:00 | Stop: 2021-08-03 | NDC 63739096411

## 2021-08-02 MED ADMIN — ALLOPURINOL 100 MG PO TAB [310]: 100 mg | ORAL | @ 15:00:00 | NDC 00904704161

## 2021-08-02 MED ADMIN — POTASSIUM CHLORIDE 20 MEQ PO TBTQ [35943]: 30 meq | ORAL | Stop: 2021-08-02 | NDC 00832532511

## 2021-08-02 MED ADMIN — METOPROLOL SUCCINATE 50 MG PO TB24 [77931]: 50 mg | ORAL | @ 15:00:00 | NDC 00904632361

## 2021-08-02 MED ADMIN — TORSEMIDE 20 MG PO TAB [18293]: 20 mg | ORAL | @ 15:00:00 | NDC 68084053911

## 2021-08-02 MED ADMIN — POTASSIUM CHLORIDE 10 MEQ PO TBTQ [35942]: 30 meq | ORAL | Stop: 2021-08-02 | NDC 00245531789

## 2021-08-03 MED ADMIN — TORSEMIDE 20 MG PO TAB [18293]: 20 mg | ORAL | @ 22:00:00 | NDC 50268075611

## 2021-08-03 MED ADMIN — METOPROLOL SUCCINATE 50 MG PO TB24 [77931]: 50 mg | ORAL | @ 15:00:00 | NDC 00904632361

## 2021-08-03 MED ADMIN — ACETAMINOPHEN 500 MG PO TAB [102]: 650 mg | ORAL | @ 15:00:00 | NDC 00904672080

## 2021-08-03 MED ADMIN — ENOXAPARIN 120 MG/0.8 ML SC SYRG [86510]: 120 mg | SUBCUTANEOUS | @ 15:00:00 | NDC 00781329804

## 2021-08-03 MED ADMIN — ALLOPURINOL 100 MG PO TAB [310]: 100 mg | ORAL | @ 03:00:00 | NDC 00904704161

## 2021-08-03 MED ADMIN — TORSEMIDE 20 MG PO TAB [18293]: 20 mg | ORAL | @ 15:00:00 | NDC 50268075611

## 2021-08-03 MED ADMIN — POTASSIUM CHLORIDE 20 MEQ PO TBTQ [35943]: 20 meq | ORAL | @ 03:00:00 | Stop: 2021-08-03 | NDC 00832532511

## 2021-08-03 MED ADMIN — ENOXAPARIN 120 MG/0.8 ML SC SYRG [86510]: 120 mg | SUBCUTANEOUS | @ 03:00:00 | NDC 00781329804

## 2021-08-03 MED ADMIN — ALLOPURINOL 100 MG PO TAB [310]: 100 mg | ORAL | @ 15:00:00 | NDC 00904704161

## 2021-08-03 MED ADMIN — POTASSIUM CHLORIDE 10 MEQ PO TBTQ [35942]: 30 meq | ORAL | Stop: 2021-08-03 | NDC 00245531789

## 2021-08-03 MED ADMIN — POTASSIUM CHLORIDE 20 MEQ PO TBTQ [35943]: 30 meq | ORAL | Stop: 2021-08-03 | NDC 00832532511

## 2021-08-03 MED ADMIN — MAGNESIUM SULFATE IN D5W 1 GRAM/100 ML IV PGBK [166578]: 1 g | INTRAVENOUS | Stop: 2021-08-04 | NDC 00338170940

## 2021-08-04 MED ADMIN — ACETAMINOPHEN 325 MG PO TAB [101]: 650 mg | ORAL | @ 21:00:00 | NDC 00904677361

## 2021-08-04 MED ADMIN — TORSEMIDE 20 MG PO TAB [18293]: 20 mg | ORAL | @ 12:00:00 | NDC 50268075611

## 2021-08-04 MED ADMIN — POTASSIUM CHLORIDE 20 MEQ PO TBTQ [35943]: 40 meq | ORAL | @ 13:00:00 | Stop: 2021-08-04 | NDC 00832532511

## 2021-08-04 MED ADMIN — ACETAMINOPHEN 325 MG PO TAB [101]: 650 mg | ORAL | @ 06:00:00 | NDC 00904677361

## 2021-08-04 MED ADMIN — ALLOPURINOL 100 MG PO TAB [310]: 100 mg | ORAL | @ 12:00:00 | NDC 00904704161

## 2021-08-04 MED ADMIN — ENOXAPARIN 120 MG/0.8 ML SC SYRG [86510]: 120 mg | SUBCUTANEOUS | @ 03:00:00 | NDC 00781329804

## 2021-08-04 MED ADMIN — ENOXAPARIN 120 MG/0.8 ML SC SYRG [86510]: 120 mg | SUBCUTANEOUS | @ 12:00:00 | NDC 00781329804

## 2021-08-04 MED ADMIN — ALLOPURINOL 100 MG PO TAB [310]: 100 mg | ORAL | @ 03:00:00 | NDC 00904704161

## 2021-08-04 MED ADMIN — METOPROLOL SUCCINATE 50 MG PO TB24 [77931]: 50 mg | ORAL | @ 12:00:00 | NDC 00904632361

## 2021-08-04 MED ADMIN — MAGNESIUM SULFATE IN D5W 1 GRAM/100 ML IV PGBK [166578]: 1 g | INTRAVENOUS | @ 04:00:00 | Stop: 2021-08-04 | NDC 00338170940

## 2021-08-05 MED ADMIN — ENOXAPARIN 120 MG/0.8 ML SC SYRG [86510]: 120 mg | SUBCUTANEOUS | @ 18:00:00 | NDC 00781329804

## 2021-08-05 MED ADMIN — ALLOPURINOL 100 MG PO TAB [310]: 100 mg | ORAL | @ 15:00:00 | NDC 00904704161

## 2021-08-05 MED ADMIN — TORSEMIDE 20 MG PO TAB [18293]: 20 mg | ORAL | @ 15:00:00 | NDC 68084053911

## 2021-08-05 MED ADMIN — ACETAMINOPHEN 325 MG PO TAB [101]: 650 mg | ORAL | @ 22:00:00 | NDC 00904677361

## 2021-08-05 MED ADMIN — ENOXAPARIN 120 MG/0.8 ML SC SYRG [86510]: 120 mg | SUBCUTANEOUS | @ 03:00:00 | NDC 00781329804

## 2021-08-05 MED ADMIN — ALLOPURINOL 100 MG PO TAB [310]: 100 mg | ORAL | @ 03:00:00 | NDC 00904704161

## 2021-08-05 MED ADMIN — LACTATED RINGERS IV SOLP [4318]: 500 mL | INTRAVENOUS | @ 23:00:00 | Stop: 2021-08-05 | NDC 00338011704

## 2021-08-05 MED ADMIN — TORSEMIDE 20 MG PO TAB [18293]: 20 mg | ORAL | @ 22:00:00 | NDC 68084053911

## 2021-08-05 MED ADMIN — METOPROLOL SUCCINATE 50 MG PO TB24 [77931]: 50 mg | ORAL | @ 15:00:00 | NDC 00904632361

## 2021-08-06 ENCOUNTER — Inpatient Hospital Stay: Admit: 2021-08-06 | Discharge: 2021-08-06 | Payer: MEDICARE | Primary: Family

## 2021-08-06 MED ADMIN — ACETAMINOPHEN 325 MG PO TAB [101]: 650 mg | ORAL | @ 21:00:00 | NDC 00904677361

## 2021-08-06 MED ADMIN — TORSEMIDE 20 MG PO TAB [18293]: 20 mg | ORAL | NDC 68084053911

## 2021-08-06 MED ADMIN — ALLOPURINOL 100 MG PO TAB [310]: 100 mg | ORAL | @ 16:00:00 | NDC 00904704161

## 2021-08-06 MED ADMIN — ENOXAPARIN 120 MG/0.8 ML SC SYRG [86510]: 120 mg | SUBCUTANEOUS | @ 16:00:00 | NDC 00781329804

## 2021-08-06 MED ADMIN — METOPROLOL SUCCINATE 50 MG PO TB24 [77931]: 50 mg | ORAL | @ 16:00:00 | NDC 00904632361

## 2021-08-06 MED ADMIN — ALLOPURINOL 100 MG PO TAB [310]: 100 mg | ORAL | @ 04:00:00 | NDC 00904704161

## 2021-08-06 MED ADMIN — TORSEMIDE 20 MG PO TAB [18293]: 20 mg | ORAL | @ 16:00:00 | NDC 68084053911

## 2021-08-07 ENCOUNTER — Encounter: Admit: 2021-08-07 | Discharge: 2021-08-07 | Payer: MEDICARE | Primary: Family

## 2021-08-07 MED ADMIN — ENOXAPARIN 120 MG/0.8 ML SC SYRG [86510]: 120 mg | SUBCUTANEOUS | @ 14:00:00 | Stop: 2021-08-07 | NDC 00781329804

## 2021-08-07 MED ADMIN — METOPROLOL SUCCINATE 50 MG PO TB24 [77931]: 50 mg | ORAL | @ 14:00:00 | NDC 00904632361

## 2021-08-07 MED ADMIN — POTASSIUM CHLORIDE 20 MEQ PO TBTQ [35943]: 40 meq | ORAL | @ 14:00:00 | Stop: 2021-08-07 | NDC 00832532511

## 2021-08-07 MED ADMIN — SPIRONOLACTONE 25 MG PO TAB [7437]: 12.5 mg | ORAL | @ 21:00:00 | NDC 00904692761

## 2021-08-07 MED ADMIN — ACETAMINOPHEN 325 MG PO TAB [101]: 650 mg | ORAL | @ 05:00:00 | NDC 00904677361

## 2021-08-07 MED ADMIN — ALLOPURINOL 100 MG PO TAB [310]: 100 mg | ORAL | @ 14:00:00 | NDC 00904704161

## 2021-08-07 MED ADMIN — ACETAMINOPHEN 325 MG PO TAB [101]: 650 mg | ORAL | @ 12:00:00 | NDC 00904677361

## 2021-08-07 MED ADMIN — ENOXAPARIN 120 MG/0.8 ML SC SYRG [86510]: 120 mg | SUBCUTANEOUS | @ 03:00:00 | NDC 00781329804

## 2021-08-07 MED ADMIN — TORSEMIDE 20 MG PO TAB [18293]: 20 mg | ORAL | @ 23:00:00 | NDC 68084053911

## 2021-08-07 MED ADMIN — ALLOPURINOL 100 MG PO TAB [310]: 100 mg | ORAL | @ 03:00:00 | NDC 00904704161

## 2021-08-07 MED ADMIN — TORSEMIDE 20 MG PO TAB [18293]: 20 mg | ORAL | @ 14:00:00 | NDC 68084053911

## 2021-08-07 NOTE — Anesthesia Pre-Procedure Evaluation
Anesthesia Pre-Procedure Evaluation    Name: Jacob Dickerson      MRN: [id number]     DOB: 02/01/1946     Age: 75 y.o.     Sex: male   _________________________________________________________________________     Procedure Info:   Procedure Information     Date/Time: 08/08/21 1250    Procedure: Transcatheter Aortic Valve Replacement - Femoral Artery -valve size and brand here    Location: [facility] / [facility]    Providers: Arby Barrette, MD          Physical Assessment  Vital Signs (last filed in past 24 hours):  BP: 113/41 (11/16 1127)  Temp: 36.8 ?C (98.3 ?F) (11/16 1127)  Pulse: 73 (11/16 1127)  Respirations: 18 PER MINUTE (11/16 1127)  SpO2: 93 % (11/16 1127)  O2 Device: None (Room air) (11/16 1127)  Weight: 120.5 kg (265 lb 9.6 oz) (11/16 0522)      Patient History   Allergies   Allergen Reactions   ? Hydrochlorothiazide NAUSEA AND VOMITING and SEE COMMENTS     Allergy recorded in SMS: HYDROCHLOROTHIA~Reactions: NAUSEA, causes gout   ? Niacin EDEMA     Eye swelling, it bothered my eyes  , Reaction: Unknown        Current Medications    Medication Directions   acetaminophen (TYLENOL EXTRA STRENGTH) 500 mg tablet Take 500 mg by mouth every 4 hours as needed for Pain. Max of 4,000 mg of acetaminophen in 24 hours.   allopurinol (ZYLOPRIM) 100 mg tablet Take 1 Tab by mouth twice daily.   aluminum & magnesium hydroxide/simethicone (MAG-AL PLUS XS) 400/400/40 mg/5 mL suspension Take 15 mL by mouth every 6 hours as needed.   apixaban (ELIQUIS) 5 mg tablet Take 5 mg by mouth twice daily.   atorvastatin (LIPITOR) 10 mg tablet Take 1 Tab by mouth daily.   diclofenac sodium (VOLTAREN) 1 % topical gel Apply  topically to affected area four times daily.   digoxin (LANOXIN) 125 mcg (0.125 mg) tablet Take 1 tablet by mouth daily.   dilTIAZem XC (TIAZAC) 120 mg capsule Take 1 capsule by mouth at bedtime daily.   dilTIAZem XC (TIAZAC) 240 mg capsule Take 240 mg by mouth every morning.   divalproex (DEPAKOTE EC) 250 mg DR tablet Take 750 mg by mouth at bedtime daily. Take with food.   docusate (COLACE) 100 mg capsule Take 100 mg by mouth twice daily as needed.   fluticasone propionate (FLONASE) 50 mcg/actuation nasal spray, suspension Apply 2 sprays to each nostril as directed daily. Shake bottle gently before using.   ibuprofen (MOTRIN) 400 mg tablet Take 400 mg by mouth every 4 hours as needed for Pain. Take with food.   levothyroxine (SYNTHROID) 75 mcg tablet Take 75 mcg by mouth daily 30 minutes before breakfast.   lisinopril (ZESTRIL) 10 mg tablet Take one tablet by mouth daily. Hold until instructed to resume by cardiology.  Patient not taking: No sig reported   magnesium hydroxide (MILK OF MAGNESIA PO) Take 30 mL by mouth as Needed.   metOLazone (ZAROXOLYN) 2.5 mg tablet Take one tablet by mouth twice weekly. Monday and Friday every week   metoprolol XL (TOPROL XL) 100 mg extended release tablet Take 1.5 tablets by mouth daily.  Patient not taking: Reported on 07/19/2021   neomycin/polymyxin/gramicidin 1.75 mg-10,000 unit-0.025mg /mL ophthalmic sol Place  into or around eye(s) daily.   omeprazole DR (PRILOSEC) 40 mg capsule Take 40 mg by  mouth daily.   potassium citrate 99 mg cap Take 1 capsule by mouth daily.   sertraline (ZOLOFT) 50 mg tablet Take 50 mg by mouth daily.   tamsulosin (FLOMAX) 0.4 mg capsule Take 0.4 mg by mouth daily. Do not crush, chew or open capsules. Take 30 minutes following the same meal each day.   torsemide (DEMADEX) 20 mg tablet Take two tablets by mouth twice daily.  Patient taking differently: Take 20 mg by mouth twice daily.         Review of Systems/Medical History      Patient summary reviewed  Nursing notes reviewed  Pertinent labs reviewed    PONV Screening: Non-smoker  No history of anesthetic complications  No family history of anesthetic complications        Pulmonary      Not a current smoker        No indications/hx of asthma    no COPD      No recent URI      Cardiovascular       Recent diagnostic studies:          echocardiogram      Exercise tolerance: >4 METS      Beta Blocker therapy: No      Beta blockers within 24 hours: n/a      Hypertension (Diltiazem),         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      Coronary artery disease        Dysrhythmias; atrial fibrillation      CHF      Hyperlipidemia (Statin)      GI/Hepatic/Renal         No hx of liver disease     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)      Neuro/Psych       No seizures      No CVA        Psychiatric history          Depression      Musculoskeletal         Arthritis      Endocrine/Other         Hypothyroidism      Obesity      Gout    Constitution - negative   Physical Exam    Airway Findings      Mallampati: IV      TM distance: >3 FB      Neck ROM: full      Mouth opening: good      Airway patency: adequate    Dental Findings:       Increased risk for dental injury; pt advised          Cardiovascular Findings:       Rhythm: irregular      Rate: normal      Other findings: murmur    Pulmonary Findings:       Breath sounds clear to auscultation.    Abdominal Findings:       Obese    Neurological Findings:       Alert and oriented x 3    Constitutional findings:       No acute distress       Diagnostic Tests  07/23/21 Carotid Artery Duplex  Interpretation Summary:  1. No hemodynamically significant (>50%) stenosis measured in?the common and internal carotid arteries bilaterally  2. There is normal antegrade flow in bilateral vertebral arteries  3. No  evidence of proximal subclavian stenosis?bilaterally  No prior study available for comparison.     07/22/21 TEE  Interpretation Summary:  4. The left ventricle is mildly dilated. No regional wall motion abnormalities are seen. Overall left ventricular systolic function appears normal. The estimated left ventricular ejection fraction is 60%.  5. Right ventricular chamber dimensions and contractility appear normal.  6. Severe left atrial enlargement.  Moderate right atrial enlargement.  7. Mobile thickened mitral chordae are noted which appear to prolapse to the mitral valve plane and perhaps mildly into the left atrium in systole. Considering the extent of mobility, some of these mitral chordae are likely ruptured. However, the severity of mitral valve regurgitation appears to be only mild.  8. A #27 mm Thermo fix bioprosthetic aortic valve is noted which appears mildly to moderately sclerotic.  Visual examination with planimetry suggests only mild bioprosthetic aortic valve stenosis.  Moderate central valvular aortic valve regurgitation is noted.  9. No pericardial effusion is seen.  10. No thrombus is noted in the left atrial appendage or the left atrium.  11. A patent foramen ovale with mild left-to-right flow is noted.  3-D imaging was performed during the procedure for further evaluation of cardiac structure and function.    07/16/21 Doppler Echo  Interpretation Summary:  ? The left ventricle is moderately dilated in size (LVIDD = 6.47 cm, LVEDV 98 mL/m?).  Left ventricular systolic function is normal and estimated to be 60%.  No segmental wall motion abnormalities.  ? The right ventricle is normal in size and systolic function.  ? The left atrium is severely dilated.  The right atrium is moderately dilated.  ? There is a #27 mm Thermo fix bioprosthetic valve in the aortic position which is well-seated.  There is some degree of valvular stenosis as indicated by elevated peak velocity and gradients which is likely due to valvular stenosis as well as some degree of underlying high flow state and/or patient prosthesis mismatch (peak velocity = 3.8 m/s, mean gradient 30 mmHg, DI = 0.39, AT = 80 ms).  There is moderate aortic insufficiency which appears to be paravalvular at the 1 o'clock position as well as a component of central valvular regurgitation (PHT = 274 ms).  ? There is a mobile 1 x 1 cm mass seen best from the atrial side and likely attached to the anterior mitral annulus which is incompletely seen on today's exam.  Differential could include vegetation or ruptured chordae in the right clinical context.  ? The visualized portions of the aortic root and ascending thoracic aorta are mildly dilated.  ? No pericardial effusion.  ? Elevated pulmonary pressure with estimated PASP of 51 mmHg.  Markedly elevated estimated CVP at 10-20 mmHg.  Compared to a prior TEE study dated 05/31/2021: Probably no significant change.  The TEE had better visualization of the mitral mass which is more suggestive of ruptured chordae.  Left ventricular systolic function is similar.  The aortic valve was better interrogated on a previous transthoracic study dated 05/29/2021 and there has been mild interval progression of aortic valve stenotic parameters with the previous peak velocity = 3.1 m/s and previous mean gradient 23 mmHg.  The previous PHT was similar at 251 ms and the degree of aortic insufficiency is likely unchanged.      Hematology:   Lab Results   Component Value Date    HGB 10.4 08/07/2021    HCT 31.9 08/07/2021    PLTCT 246 08/07/2021    WBC  7.1 08/07/2021    NEUT 76 07/30/2021    ANC 6.91 07/30/2021    ALC 0.76 07/30/2021    MONA 14 07/30/2021    AMC 1.31 07/30/2021    EOSA 1 07/30/2021    ABC 0.07 07/30/2021    MCV 94.7 08/07/2021    MCH 30.8 08/07/2021    MCHC 32.5 08/07/2021    MPV 8.8 08/07/2021    RDW 18.0 08/07/2021         General Chemistry:   Lab Results   Component Value Date    NA 139 08/07/2021    K 3.6 08/07/2021    CL 100 08/07/2021    CO2 27 08/07/2021    GAP 12 08/07/2021    BUN 34 08/07/2021    CR 1.58 08/07/2021    GLU 109 08/07/2021    GLU 88 08/27/2011    CA 8.5 08/07/2021    ALBUMIN 3.5 08/07/2021    OBSCA 1.14 12/30/2007    MG 2.0 08/07/2021    TOTBILI 0.4 08/07/2021    PO4 3.5 07/15/2021      Coagulation:   Lab Results   Component Value Date    PTT 38.9 08/07/2021    INR 1.1 12/30/2007         Anesthesia Plan    ASA score: 4   Plan: invasive monitoring and general  Special equipment/procedures: Art line, CVC and TEE  Induction method: intravenous      Informed Consent  Anesthetic plan and risks discussed with patient.  Use of blood products discussed with patient  Blood Consent: consented      Plan discussed with: resident.

## 2021-08-08 ENCOUNTER — Encounter: Admit: 2021-08-08 | Discharge: 2021-08-08 | Payer: MEDICARE | Primary: Family

## 2021-08-08 ENCOUNTER — Inpatient Hospital Stay: Admit: 2021-08-08 | Discharge: 2021-08-08 | Payer: MEDICARE | Primary: Family

## 2021-08-08 DIAGNOSIS — E785 Hyperlipidemia, unspecified: Secondary | ICD-10-CM

## 2021-08-08 DIAGNOSIS — N401 Enlarged prostate with lower urinary tract symptoms: Secondary | ICD-10-CM

## 2021-08-08 DIAGNOSIS — Z720 Tobacco use: Secondary | ICD-10-CM

## 2021-08-08 DIAGNOSIS — E669 Obesity, unspecified: Secondary | ICD-10-CM

## 2021-08-08 DIAGNOSIS — E781 Pure hyperglyceridemia: Secondary | ICD-10-CM

## 2021-08-08 DIAGNOSIS — M109 Gout, unspecified: Secondary | ICD-10-CM

## 2021-08-08 DIAGNOSIS — I4891 Unspecified atrial fibrillation: Secondary | ICD-10-CM

## 2021-08-08 DIAGNOSIS — R011 Cardiac murmur, unspecified: Secondary | ICD-10-CM

## 2021-08-08 DIAGNOSIS — I38 Endocarditis, valve unspecified: Secondary | ICD-10-CM

## 2021-08-08 DIAGNOSIS — M179 Osteoarthritis of knee, unspecified: Secondary | ICD-10-CM

## 2021-08-08 DIAGNOSIS — I350 Nonrheumatic aortic (valve) stenosis: Secondary | ICD-10-CM

## 2021-08-08 MED ORDER — ROCURONIUM 10 MG/ML IV SOLN
INTRAVENOUS | 0 refills | Status: DC
Start: 2021-08-08 — End: 2021-08-08
  Administered 2021-08-08: 17:00:00 20 mg via INTRAVENOUS
  Administered 2021-08-08: 17:00:00 10 mg via INTRAVENOUS
  Administered 2021-08-08: 16:00:00 50 mg via INTRAVENOUS

## 2021-08-08 MED ORDER — PHENYLEPHRINE 40 MCG/ML IN NS IV DRIP (STD CONC)
INTRAVENOUS | 0 refills | Status: DC
Start: 2021-08-08 — End: 2021-08-08
  Administered 2021-08-08 (×2): .2 ug/kg/min via INTRAVENOUS

## 2021-08-08 MED ORDER — FENTANYL CITRATE (PF) 50 MCG/ML IJ SOLN
INTRAVENOUS | 0 refills | Status: DC
Start: 2021-08-08 — End: 2021-08-08
  Administered 2021-08-08: 16:00:00 100 ug via INTRAVENOUS

## 2021-08-08 MED ORDER — PROTAMINE 10 MG/ML IV SOLN
INTRAVENOUS | 0 refills | Status: DC
Start: 2021-08-08 — End: 2021-08-08
  Administered 2021-08-08: 18:00:00 30 mg via INTRAVENOUS
  Administered 2021-08-08: 18:00:00 20 mg via INTRAVENOUS

## 2021-08-08 MED ORDER — ELECTROLYTE-A IV SOLP
INTRAVENOUS | 0 refills | Status: DC
Start: 2021-08-08 — End: 2021-08-08
  Administered 2021-08-08: 17:00:00 via INTRAVENOUS

## 2021-08-08 MED ORDER — LIDOCAINE (PF) 200 MG/10 ML (2 %) IJ SYRG
INTRAVENOUS | 0 refills | Status: DC
Start: 2021-08-08 — End: 2021-08-08
  Administered 2021-08-08: 16:00:00 100 mg via INTRAVENOUS

## 2021-08-08 MED ORDER — CEFAZOLIN 1 GRAM IJ SOLR
INTRAVENOUS | 0 refills | Status: DC
Start: 2021-08-08 — End: 2021-08-08
  Administered 2021-08-08: 17:00:00 3 g via INTRAVENOUS

## 2021-08-08 MED ORDER — ONDANSETRON HCL (PF) 4 MG/2 ML IJ SOLN
INTRAVENOUS | 0 refills | Status: DC
Start: 2021-08-08 — End: 2021-08-08
  Administered 2021-08-08: 18:00:00 4 mg via INTRAVENOUS

## 2021-08-08 MED ORDER — NICARDIPINE IN NACL (ISO-OS) 20 MG/200 ML IV PGBK (INFUSION)(AM)(OR)
INTRAVENOUS | 0 refills | Status: DC
Start: 2021-08-08 — End: 2021-08-08
  Administered 2021-08-08: 18:00:00 2.5 mg/h via INTRAVENOUS

## 2021-08-08 MED ORDER — DEXAMETHASONE SODIUM PHOSPHATE 4 MG/ML IJ SOLN
INTRAVENOUS | 0 refills | Status: DC
Start: 2021-08-08 — End: 2021-08-08
  Administered 2021-08-08: 17:00:00 4 mg via INTRAVENOUS

## 2021-08-08 MED ORDER — SUGAMMADEX 100 MG/ML IV SOLN
INTRAVENOUS | 0 refills | Status: DC
Start: 2021-08-08 — End: 2021-08-08
  Administered 2021-08-08: 18:00:00 250 mg via INTRAVENOUS

## 2021-08-08 MED ORDER — ARTIFICIAL TEARS (PF) SINGLE DOSE DROPS GROUP
OPHTHALMIC | 0 refills | Status: DC
Start: 2021-08-08 — End: 2021-08-08
  Administered 2021-08-08: 16:00:00 2 [drp] via OPHTHALMIC

## 2021-08-08 MED ORDER — PROPOFOL INJ 10 MG/ML IV VIAL
INTRAVENOUS | 0 refills | Status: DC
Start: 2021-08-08 — End: 2021-08-08
  Administered 2021-08-08 (×2): 50 mg via INTRAVENOUS

## 2021-08-08 MED ORDER — NOREPINEPHRINE IV DRIP STD CONC (AM)(OR)
INTRAVENOUS | 0 refills | Status: DC
Start: 2021-08-08 — End: 2021-08-08
  Administered 2021-08-08: 17:00:00 .05 ug/kg/min via INTRAVENOUS

## 2021-08-08 MED ORDER — NITROGLYCERIN 200MCG SYRINGE (OR)(OSM)
INTRAVENOUS | 0 refills | Status: DC
Start: 2021-08-08 — End: 2021-08-08
  Administered 2021-08-08 (×2): 20 ug via INTRAVENOUS

## 2021-08-08 MED ORDER — HEPARIN (PORCINE) 1,000 UNIT/ML IJ SOLN
INTRAVENOUS | 0 refills | Status: DC
Start: 2021-08-08 — End: 2021-08-08
  Administered 2021-08-08: 17:00:00 8000 [IU] via INTRAVENOUS
  Administered 2021-08-08: 17:00:00 3000 [IU] via INTRAVENOUS
  Administered 2021-08-08: 17:00:00 2000 [IU] via INTRAVENOUS

## 2021-08-08 MED ADMIN — POTASSIUM CHLORIDE 20 MEQ PO TBTQ [35943]: 20 meq | ORAL | @ 13:00:00 | Stop: 2021-08-08 | NDC 00832532511

## 2021-08-08 MED ADMIN — LIDOCAINE (PF) 10 MG/ML (1 %) IJ SOLN [95838]: 0.2 mL | INTRAMUSCULAR | @ 15:00:00 | Stop: 2021-08-08 | NDC 63323049204

## 2021-08-08 MED ADMIN — SODIUM CHLORIDE 0.9 % IV SOLP [27838]: 1000.000 mL | INTRAVENOUS | @ 16:00:00 | Stop: 2021-08-08 | NDC 00338004904

## 2021-08-08 MED ADMIN — ACETAMINOPHEN 325 MG PO TAB [101]: 650 mg | ORAL | @ 10:00:00 | NDC 00904677361

## 2021-08-08 MED ADMIN — ASPIRIN 81 MG PO CHEW [680]: 81 mg | ORAL | @ 15:00:00 | Stop: 2021-08-08 | NDC 16103036611

## 2021-08-08 MED ADMIN — ACETAMINOPHEN 325 MG PO TAB [101]: 650 mg | ORAL | @ 03:00:00 | NDC 00904677361

## 2021-08-08 MED ADMIN — MAGNESIUM OXIDE 400 MG (241.3 MG MAGNESIUM) PO TAB [10491]: 200 mg | ORAL | @ 20:00:00 | Stop: 2021-08-23 | NDC 10006070028

## 2021-08-08 MED ADMIN — ALLOPURINOL 100 MG PO TAB [310]: 100 mg | ORAL | @ 03:00:00 | NDC 00904704161

## 2021-08-08 MED ADMIN — POTASSIUM CHLORIDE 20 MEQ PO TBTQ [35943]: 40 meq | ORAL | @ 20:00:00 | Stop: 2022-08-08 | NDC 00832532511

## 2021-08-08 MED ADMIN — SODIUM CHLORIDE 0.9 % IV SOLP [27838]: 1000.000 mL | INTRAVENOUS | @ 23:00:00 | Stop: 2021-08-09 | NDC 00338004904

## 2021-08-09 ENCOUNTER — Encounter

## 2021-08-09 DIAGNOSIS — I10 Essential (primary) hypertension: Secondary | ICD-10-CM

## 2021-08-09 DIAGNOSIS — Z952 Presence of prosthetic heart valve: Secondary | ICD-10-CM

## 2021-08-09 MED ADMIN — ALLOPURINOL 100 MG PO TAB [310]: 100 mg | ORAL | @ 02:00:00 | NDC 00904704161

## 2021-08-09 MED ADMIN — TORSEMIDE 20 MG PO TAB [18293]: 20 mg | ORAL | @ 15:00:00 | NDC 50268075611

## 2021-08-09 MED ADMIN — SPIRONOLACTONE 25 MG PO TAB [7437]: 12.5 mg | ORAL | @ 15:00:00 | NDC 00904692761

## 2021-08-09 MED ADMIN — SENNOSIDES-DOCUSATE SODIUM 8.6-50 MG PO TAB [40926]: 2 | ORAL | @ 15:00:00 | NDC 70000052601

## 2021-08-09 MED ADMIN — WATER FOR INJECTION, STERILE IJ SOLN [79513]: 20 mL | INTRAVENOUS | @ 11:00:00 | Stop: 2021-08-09 | NDC 00409488717

## 2021-08-09 MED ADMIN — METOPROLOL SUCCINATE 25 MG PO TB24 [81866]: 12.5 mg | ORAL | @ 15:00:00 | Stop: 2021-08-09 | NDC 00904632261

## 2021-08-09 MED ADMIN — TORSEMIDE 20 MG PO TAB [18293]: 20 mg | ORAL | @ 22:00:00 | NDC 50268075611

## 2021-08-09 MED ADMIN — CEFAZOLIN INJ 1GM IVP [210319]: 2 g | INTRAVENOUS | @ 11:00:00 | Stop: 2021-08-10 | NDC 60505614200

## 2021-08-09 MED ADMIN — CEFAZOLIN INJ 1GM IVP [210319]: 2 g | INTRAVENOUS | @ 18:00:00 | Stop: 2021-08-10 | NDC 60505614200

## 2021-08-09 MED ADMIN — DAPAGLIFLOZIN 10 MG PO TAB [320172]: 10 mg | ORAL | @ 22:00:00 | NDC 00310621030

## 2021-08-09 MED ADMIN — APIXABAN 5 MG PO TAB [315778]: 5 mg | ORAL | @ 15:00:00 | NDC 00003089431

## 2021-08-09 MED ADMIN — TORSEMIDE 20 MG PO TAB [18293]: 20 mg | ORAL | @ 02:00:00 | NDC 50268075611

## 2021-08-09 MED ADMIN — ASPIRIN 81 MG PO CHEW [680]: 81 mg | ORAL | @ 15:00:00 | NDC 16103036611

## 2021-08-09 MED ADMIN — ALLOPURINOL 100 MG PO TAB [310]: 100 mg | ORAL | @ 15:00:00 | NDC 00904704161

## 2021-08-09 MED ADMIN — METOPROLOL SUCCINATE 25 MG PO TB24 [81866]: 12.5 mg | ORAL | @ 17:00:00 | Stop: 2021-08-09 | NDC 00904632261

## 2021-08-10 ENCOUNTER — Inpatient Hospital Stay: Admit: 2021-08-10 | Discharge: 2021-08-10 | Payer: MEDICARE | Primary: Family

## 2021-08-10 ENCOUNTER — Encounter: Admit: 2021-08-10 | Discharge: 2021-08-10 | Payer: MEDICARE | Primary: Family

## 2021-08-10 MED ADMIN — DAPAGLIFLOZIN 10 MG PO TAB [320172]: 10 mg | ORAL | @ 15:00:00 | Stop: 2021-08-10 | NDC 00310621030

## 2021-08-10 MED ADMIN — OXYCODONE 5 MG PO TAB [10814]: 5 mg | ORAL | @ 10:00:00 | Stop: 2021-08-10 | NDC 00904696661

## 2021-08-10 MED ADMIN — SENNOSIDES-DOCUSATE SODIUM 8.6-50 MG PO TAB [40926]: 2 | ORAL | @ 15:00:00 | Stop: 2021-08-10 | NDC 70000052601

## 2021-08-10 MED ADMIN — WATER FOR INJECTION, STERILE IJ SOLN [79513]: 20 mL | INTRAVENOUS | @ 03:00:00 | Stop: 2021-08-10 | NDC 63323018508

## 2021-08-10 MED ADMIN — CEFAZOLIN INJ 1GM IVP [210319]: 2 g | INTRAVENOUS | @ 03:00:00 | Stop: 2021-08-10 | NDC 60505614200

## 2021-08-10 MED ADMIN — METOPROLOL SUCCINATE 25 MG PO TB24 [81866]: 25 mg | ORAL | @ 15:00:00 | Stop: 2021-08-10 | NDC 00904632261

## 2021-08-10 MED ADMIN — TORSEMIDE 20 MG PO TAB [18293]: 20 mg | ORAL | @ 15:00:00 | Stop: 2021-08-10 | NDC 68084053911

## 2021-08-10 MED ADMIN — ASPIRIN 81 MG PO CHEW [680]: 81 mg | ORAL | @ 15:00:00 | Stop: 2021-08-10 | NDC 16103036611

## 2021-08-10 MED ADMIN — MAGNESIUM OXIDE 400 MG (241.3 MG MAGNESIUM) PO TAB [10491]: 200 mg | ORAL | @ 12:00:00 | Stop: 2021-08-10 | NDC 10006070028

## 2021-08-10 MED ADMIN — ALLOPURINOL 100 MG PO TAB [310]: 100 mg | ORAL | @ 03:00:00 | NDC 00904704161

## 2021-08-10 MED ADMIN — SPIRONOLACTONE 25 MG PO TAB [7437]: 12.5 mg | ORAL | @ 15:00:00 | Stop: 2021-08-10 | NDC 00904692761

## 2021-08-10 MED ADMIN — POTASSIUM CHLORIDE 20 MEQ PO TBTQ [35943]: 20 meq | ORAL | @ 12:00:00 | Stop: 2021-08-10 | NDC 00832532511

## 2021-08-10 MED ADMIN — APIXABAN 5 MG PO TAB [315778]: 5 mg | ORAL | @ 15:00:00 | Stop: 2021-08-10 | NDC 00003089431

## 2021-08-10 MED ADMIN — ALLOPURINOL 100 MG PO TAB [310]: 100 mg | ORAL | @ 15:00:00 | Stop: 2021-08-10 | NDC 00904704161

## 2021-08-10 MED ADMIN — APIXABAN 5 MG PO TAB [315778]: 5 mg | ORAL | @ 03:00:00 | NDC 00003089431

## 2021-08-10 MED ADMIN — ACETAMINOPHEN 325 MG PO TAB [101]: 650 mg | ORAL | @ 07:00:00 | Stop: 2021-08-10 | NDC 00904677361

## 2021-08-10 MED FILL — METOPROLOL SUCCINATE 25 MG PO TB24: 25 mg | ORAL | 90 days supply | Qty: 90 | Fill #1 | Status: CP

## 2021-08-10 MED FILL — SPIRONOLACTONE 25 MG PO TAB: 25 mg | ORAL | 60 days supply | Qty: 30 | Fill #1 | Status: CP

## 2021-08-10 MED FILL — DAPAGLIFLOZIN 10 MG PO TAB: 10 mg | ORAL | 90 days supply | Qty: 90 | Fill #1 | Status: CP

## 2021-08-19 ENCOUNTER — Ambulatory Visit: Admit: 2021-08-19 | Discharge: 2021-08-19 | Payer: MEDICARE | Primary: Family

## 2021-08-19 ENCOUNTER — Encounter: Admit: 2021-08-19 | Discharge: 2021-08-19 | Payer: MEDICARE | Primary: Family

## 2021-08-19 DIAGNOSIS — E781 Pure hyperglyceridemia: Secondary | ICD-10-CM

## 2021-08-19 DIAGNOSIS — N1831 Stage 3a chronic kidney disease (HCC): Secondary | ICD-10-CM

## 2021-08-19 DIAGNOSIS — I5043 Acute on chronic combined systolic (congestive) and diastolic (congestive) heart failure: Secondary | ICD-10-CM

## 2021-08-19 DIAGNOSIS — E785 Hyperlipidemia, unspecified: Secondary | ICD-10-CM

## 2021-08-19 DIAGNOSIS — I4819 Other persistent atrial fibrillation: Secondary | ICD-10-CM

## 2021-08-19 DIAGNOSIS — N401 Enlarged prostate with lower urinary tract symptoms: Secondary | ICD-10-CM

## 2021-08-19 DIAGNOSIS — Z720 Tobacco use: Secondary | ICD-10-CM

## 2021-08-19 DIAGNOSIS — R011 Cardiac murmur, unspecified: Secondary | ICD-10-CM

## 2021-08-19 DIAGNOSIS — R0989 Other specified symptoms and signs involving the circulatory and respiratory systems: Secondary | ICD-10-CM

## 2021-08-19 DIAGNOSIS — E669 Obesity, unspecified: Secondary | ICD-10-CM

## 2021-08-19 DIAGNOSIS — I351 Nonrheumatic aortic (valve) insufficiency: Secondary | ICD-10-CM

## 2021-08-19 DIAGNOSIS — M179 Osteoarthritis of knee, unspecified: Secondary | ICD-10-CM

## 2021-08-19 DIAGNOSIS — M109 Gout, unspecified: Secondary | ICD-10-CM

## 2021-08-19 DIAGNOSIS — I38 Endocarditis, valve unspecified: Secondary | ICD-10-CM

## 2021-08-19 DIAGNOSIS — Z952 Presence of prosthetic heart valve: Secondary | ICD-10-CM

## 2021-08-19 DIAGNOSIS — I350 Nonrheumatic aortic (valve) stenosis: Secondary | ICD-10-CM

## 2021-08-19 DIAGNOSIS — I4891 Unspecified atrial fibrillation: Secondary | ICD-10-CM

## 2021-08-19 MED ORDER — METOPROLOL SUCCINATE 50 MG PO TB24
50 mg | ORAL_TABLET | Freq: Every day | ORAL | 3 refills | 90.00000 days | Status: AC
Start: 2021-08-19 — End: ?

## 2021-08-19 NOTE — Progress Notes
Date of Service: 08/19/2021    Jacob Dickerson is a 75 y.o. male.       HPI       I had the pleasure of seeing Jacob Dickerson for post hospital cardiac follow-up.  He is a 75 year old with a history of hypertension, hyperlipidemia, obesity, stage III CKD, persistent A. 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  He had a 27 Thermo fix pericardial aortic valve replacement 2009 by Dr. [name].  He has had multiple heart failure admissions in the last few months.  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      He was admitted on October 24 for volume overload.  Echocardiogram revealed aortic stenosis with mean gradient of 30 mmHg moderate aortic regurgitation.  He had mild mitral regurgitation noted as well.  It was recommended that he undergo evaluation for valve in valve TAVR.  He had a cardiac catheterization that revealed mild nonobstructive coronary disease.  He had a Panorex as part of his work-up that showed dental caries and a small periapical lucency in the left maxillary premolar.  Dental extraction was recommended.  Unfortunately following extraction he had significant bleeding from the gums.  TAVR was postponed until it was controlled.  Ultimately he underwent valve in valve TAVR with a 26 SAPIEN by Dr. Helen Hashimoto and Dr. Riley Nearing on November 17.  His postprocedure echocardiogram revealed a well-seated valve with no stenosis or regurgitation.  The mean gradient was 14 mmHg.  He was discharged with home health on postprocedure day 2.  He had multiple medication changes at discharge.    He has had significant symptomatic improvement since discharge.  He has noticed an increase in energy level and overall feels better.  He said he sleeping better and his feet are no longer cold.  He does not feel that he is having much shortness of breath.  He uses a walker to get around.  He currently lives in assisted living.       Vitals:    08/19/21 1113   BP: 128/78   BP Source: Arm, Left Upper   Pulse: (!) 123   PainSc: Zero   Weight: 124.3 kg (274 lb)   Height: 165.1 cm (5' 5)     Body mass index is 45.6 kg/m?Marland Kitchen     Past Medical History  Patient Active Problem List    Diagnosis Date Noted   ? Age-related physical debility 08/08/2021   ? S/P TAVR (transcatheter aortic valve replacement) 08/08/2021     08/08/21 - Valve in valve TAVR - [name]     ? Atherosclerosis of aorta (HCC) 08/08/2021   ? Acute blood loss anemia 07/31/2021   ? 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 Poor dentition requiring referral to dentistry 07/24/2021   ? Stage 3b chronic kidney disease (HCC) 07/23/2021   ? Chronic anticoagulation 07/23/2021     Eliquis for afib     ? 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, adult (HCC) 07/23/2021   ? Acute on chronic combined systolic and diastolic CHF, NYHA class 2 (HCC) 07/23/2021   ? Mild mitral valve regurgitation 07/23/2021   ? Ruptured chordae tendineae (HCC) 07/23/2021     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 07/23/21     ? BPH (benign prostatic hyperplasia) 07/23/2021   ? 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  Chronic back pain 07/23/2021   ? Major depressive disorder 07/23/2021   ? Osteoarthritis of knee 07/23/2021   ? Gout 07/23/2021   ? Cardiomegaly 07/23/2021   ? Edema 07/15/2021   ? Morbid obesity with BMI of 40.0-44.9, adult (HCC) 05/29/2021   ? 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 Demand ischemia (HCC) 05/29/2021   ? Acute respiratory failure with hypoxia (HCC) 05/29/2021   ? Coronary artery disease involving native coronary artery of native heart without angina pectoris 05/29/2021   ? Acute kidney injury (HCC) 05/29/2021   ? Stage 3 chronic kidney disease (HCC) 05/29/2021   ? Atrial fibrillation (HCC) 05/14/2021     On Eliquis     ? Wrist fracture, left 03/03/2011     - has full ROM.   Occasional pain in it but able to use it full time.     ? Primary hypertension 12/17/2010   ? Hyperlipidemia 12/17/2010   ? Gout attack 11/25/2010   ? Bladder stone 06/29/2009   ? S/P AVR (aortic valve replacement) 12/31/2007     01/08/08 [place] - Dr [name]  using porcine-type valve  03/31/08 Echo:EF 55%. mild left ventricular hypertrophy, 17mm gradient across the aortic valve with mildly enlarged aortic root.    10/18/15- [facility]- Lexiscan MPI- Normal LV perfusion. Imaging is negative for ischemia. Global LV systolic function normal with EF 64%. Normal wall motion. Small, fixed, basal- inferolateral defect.  11/12/17- [facility]- Echo- Mild concentric LV hypertrophy. Normal LV function EF 55-60%. Well seated bioprosthetic valve. Mild tricuspid insufficiency. Mildly dilated aortic root.         ? Sprain and strain of other specified sites of shoulder and upper arm 02/02/2007         Review of Systems   Constitutional: Negative.   HENT: Negative.    Eyes: Negative.    Cardiovascular: Negative.    Respiratory: Negative.    Endocrine: Negative.    Hematologic/Lymphatic: Negative.    Skin: Negative.    Musculoskeletal: Negative.    Gastrointestinal: Negative.    Genitourinary: Negative.    Neurological: Negative.    Psychiatric/Behavioral: Negative.    All other systems reviewed and are negative.      Physical Exam  General Appearance: no acute distress, obese  Skin: bilateral groin access sites soft without redness or drainage  HEENT: unremarkable  Neck Veins: neck veins are flat & not distended  Carotid Arteries: no bruits  Chest Inspection: chest is normal in appearance  Auscultation/Percussion: lungs clear to auscultation, no rales, rhonchi, or wheezing  Cardiac Rhythm: irregular rhythm & tachycardia  Cardiac Auscultation: Normal S1 & S2, no S3 or S4, no rub  Murmurs: no cardiac murmurs   Extremities: no lower extremity edema; 2+ symmetric distal pulses  Abdominal Exam: soft, non-tender, no masses, bowel sounds normal  Liver & Spleen: no organomegaly  Neurologic Exam: oriented to time, place and person; no focal neurologic deficits  Psychiatric: Normal mood and affect.  Behavior is normal. Judgment and thought content normal.         Cardiovascular Studies  Preliminary EKG:    Afib with RVR, rate 123 bpm.  Incomplete LBBB    Cardiovascular Health Factors  Vitals BP Readings from Last 3 Encounters:   08/19/21 128/78   08/10/21 107/62   07/02/21 (!) 140/50     Wt Readings from Last 3 Encounters:   08/19/21 124.3 kg (274 lb)   08/10/21 121.7 kg (  268 lb 6.4 oz)   07/02/21 136 kg (299 lb 12.8 oz)     BMI Readings from Last 3 Encounters:   08/19/21 45.60 kg/m?   08/10/21 44.66 kg/m?   07/02/21 51.46 kg/m?      Smoking Social History     Tobacco Use   Smoking Status Former   ? Packs/day: 0.25   ? Years: 30.00   ? Pack years: 7.50   ? Types: Cigarettes   ? Quit date: 05/11/2009   ? Years since quitting: 12.2   Smokeless Tobacco Never   Tobacco Comments    quit 2008      Lipid Profile Cholesterol   Date Value Ref Range Status   07/17/2021 115 <200 MG/DL Final     HDL   Date Value Ref Range Status   07/17/2021 20 (L) >40 MG/DL Final     LDL   Date Value Ref Range Status   07/17/2021 64 <100 mg/dL Final     Triglycerides   Date Value Ref Range Status   07/17/2021 219 (H) <150 MG/DL Final      Blood Sugar Hemoglobin A1C   Date Value Ref Range Status   07/16/2021 5.6 4.0 - 6.0 % Final     Comment:     The ADA recommends that most patients with type 1 and type 2 diabetes maintain   an A1c level <7%.       Glucose   Date Value Ref Range Status   08/10/2021 120 (H) 70 - 100 MG/DL Final   16/06/9603 540 (H) 70 - 100 MG/DL Final   98/07/9146 829 (H) 70 - 100 MG/DL Final   56/21/3086 88 65 - 99 mg/dL Final     Comment:                   Fasting reference interval      04/17/2006 89 70 - 110 MG/DL Final   57/84/6962 86 70 - 110 MG/DL Final     Glucose, POC   Date Value Ref Range Status   01/02/2008 117 (H) 70 - 110 MG/DL Final   95/28/4132 440 (H) 70 - 110 MG/DL Final   07/19/2535 644 (H) 70 - 110 MG/DL Final          Problems Addressed Today  Encounter Diagnoses   Name Primary?   ? Nonrheumatic aortic valve insufficiency Yes   ? S/P TAVR (transcatheter aortic valve replacement)    ? Cardiovascular symptoms    ? Acute on chronic combined systolic and diastolic CHF, NYHA class 2 (HCC)    ? Persistent atrial fibrillation (HCC)    ? Stage 3a chronic kidney disease (HCC)        Assessment and Plan       1.  Bioprosthetic aortic valve dysfunction.  He underwent valve in valve TAVR with a 26 SAPIEN valve.  He has had significant symptomatic improvement.  He is encouraged to continue to increase his activity and participate in cardiac rehab.  His access sites are stable.    2.  Persistent atrial fibrillation.  His heart rate is elevated today at 123 beats a minute although he is not symptomatic.  He had several medication changes made at discharge.  Digoxin and diltiazem were discontinued and metoprolol was decreased.  The med list that he brought with him today however is out of date.  I sent a new med list with him for the assisted living staff.  I will increase metoprolol to  50 mg daily.  He is on apixaban for anticoagulation.    3.  Acute on chronic diastolic heart failure.  He has NYHA class II symptoms.  His volume status appears to be relatively stable.  He was started on multiple heart failure medications at discharge.  He needs to monitor daily weights.    4.  Hypertension.  His blood pressure is stable on metoprolol and spironolactone.    5.  CKD stage IIIa.  We will check at follow-up.    He is scheduled to follow-up with an echo on December 23 as required by the TVT registry.  Thank you for allowing [place] to participate in the care of this pleasant individual.  If you have any other questions or concerns, please do not hesitate to contact us.    Sherrlyn Hock, APRN  Structural Heart Nurse Practitioner  Pager [phone]           Current Medications (including today's revisions)  ? acetaminophen (TYLENOL EXTRA STRENGTH) 500 mg tablet Take 500 mg by mouth every 4 hours as needed for Pain. Max of 4,000 mg of acetaminophen in 24 hours.   ? allopurinol (ZYLOPRIM) 100 mg tablet Take 1 Tab by mouth twice daily.   ? aluminum & magnesium hydroxide/simethicone (MAG-AL PLUS XS) 400/400/40 mg/5 mL suspension Take 15 mL by mouth every 6 hours as needed.   ? apixaban (ELIQUIS) 5 mg tablet Take 5 mg by mouth twice daily.   ? aspirin 81 mg chewable tablet Chew one tablet by mouth daily. Take with food.   ? atorvastatin (LIPITOR) 10 mg tablet Take 1 Tab by mouth daily.   ? dapagliflozin (FARXIGA) 10 mg tablet Take one tablet by mouth daily. Indications: chronic kidney disease with albuminuria, Heart failure with preserved ejection fraction   ? diclofenac sodium (VOLTAREN) 1 % topical gel Apply  topically to affected area four times daily.   ? divalproex (DEPAKOTE EC) 250 mg DR tablet Take 750 mg by mouth at bedtime daily. Take with food.   ? docusate (COLACE) 100 mg capsule Take 100 mg by mouth twice daily as needed.   ? fluticasone propionate (FLONASE) 50 mcg/actuation nasal spray, suspension Apply 2 sprays to each nostril as directed daily. Shake bottle gently before using.   ? levothyroxine (SYNTHROID) 75 mcg tablet Take 75 mcg by mouth daily 30 minutes before breakfast.   ? magnesium hydroxide (MILK OF MAGNESIA PO) Take 30 mL by mouth as Needed.   ? metOLazone (ZAROXOLYN) 2.5 mg tablet Take one tablet by mouth twice weekly. Monday and Friday every week   ? metoprolol succinate XL (TOPROL XL) 50 mg extended release tablet Take one tablet by mouth daily.   ? neomycin/polymyxin/gramicidin 1.75 mg-10,000 unit-0.025mg /mL ophthalmic sol Place  into or around eye(s) daily.   ? omeprazole DR (PRILOSEC) 40 mg capsule Take 40 mg by mouth daily.   ? senna/docusate (SENOKOT-S) 8.6/50 mg tablet Take two tablets by mouth twice daily.   ? sertraline (ZOLOFT) 50 mg tablet Take 50 mg by mouth daily.   ? spironolactone (ALDACTONE) 25 mg tablet Take one-half tablet by mouth daily. Take with food.   ? tamsulosin (FLOMAX) 0.4 mg capsule Take 0.4 mg by mouth daily. Do not crush, chew or open capsules. Take 30 minutes following the same meal each day.   ? torsemide (DEMADEX) 20 mg tablet Take two tablets by mouth daily with breakfast AND one tablet daily with dinner.

## 2021-08-19 NOTE — Patient Instructions
Increase metoprolol to 50 mg daily.

## 2021-09-02 ENCOUNTER — Encounter: Admit: 2021-09-02 | Discharge: 2021-09-02 | Payer: MEDICARE | Primary: Family

## 2021-09-02 ENCOUNTER — Ambulatory Visit: Admit: 2021-09-02 | Discharge: 2021-09-02 | Payer: MEDICARE | Primary: Family

## 2021-09-02 DIAGNOSIS — N401 Enlarged prostate with lower urinary tract symptoms: Secondary | ICD-10-CM

## 2021-09-02 DIAGNOSIS — I4891 Unspecified atrial fibrillation: Secondary | ICD-10-CM

## 2021-09-02 DIAGNOSIS — R011 Cardiac murmur, unspecified: Secondary | ICD-10-CM

## 2021-09-02 DIAGNOSIS — Z720 Tobacco use: Secondary | ICD-10-CM

## 2021-09-02 DIAGNOSIS — M109 Gout, unspecified: Secondary | ICD-10-CM

## 2021-09-02 DIAGNOSIS — Z789 Other specified health status: Secondary | ICD-10-CM

## 2021-09-02 DIAGNOSIS — I350 Nonrheumatic aortic (valve) stenosis: Secondary | ICD-10-CM

## 2021-09-02 DIAGNOSIS — I38 Endocarditis, valve unspecified: Secondary | ICD-10-CM

## 2021-09-02 DIAGNOSIS — E785 Hyperlipidemia, unspecified: Secondary | ICD-10-CM

## 2021-09-02 DIAGNOSIS — E669 Obesity, unspecified: Secondary | ICD-10-CM

## 2021-09-02 DIAGNOSIS — E781 Pure hyperglyceridemia: Secondary | ICD-10-CM

## 2021-09-02 DIAGNOSIS — M179 Osteoarthritis of knee, unspecified: Secondary | ICD-10-CM

## 2021-09-02 NOTE — Patient Instructions
Continue current medications  Monitor your weight and swelling. If weight increases 2 pounds above your baseline for two days in a row, take an additional dose of torsemide  If you have any questions or concerns, you can reach me best with a MyChart message or through my nurse, Dahlia Client, at [phone].

## 2021-09-03 ENCOUNTER — Encounter: Admit: 2021-09-03 | Discharge: 2021-09-03 | Payer: MEDICARE | Primary: Family

## 2021-09-03 DIAGNOSIS — I4891 Unspecified atrial fibrillation: Secondary | ICD-10-CM

## 2021-09-03 DIAGNOSIS — I38 Endocarditis, valve unspecified: Secondary | ICD-10-CM

## 2021-09-03 DIAGNOSIS — I350 Nonrheumatic aortic (valve) stenosis: Secondary | ICD-10-CM

## 2021-09-03 DIAGNOSIS — M109 Gout, unspecified: Secondary | ICD-10-CM

## 2021-09-03 DIAGNOSIS — Z720 Tobacco use: Secondary | ICD-10-CM

## 2021-09-03 DIAGNOSIS — N401 Enlarged prostate with lower urinary tract symptoms: Secondary | ICD-10-CM

## 2021-09-03 DIAGNOSIS — M179 Osteoarthritis of knee, unspecified: Secondary | ICD-10-CM

## 2021-09-03 DIAGNOSIS — E781 Pure hyperglyceridemia: Secondary | ICD-10-CM

## 2021-09-03 DIAGNOSIS — E785 Hyperlipidemia, unspecified: Secondary | ICD-10-CM

## 2021-09-03 DIAGNOSIS — R011 Cardiac murmur, unspecified: Secondary | ICD-10-CM

## 2021-09-03 DIAGNOSIS — E669 Obesity, unspecified: Secondary | ICD-10-CM

## 2021-09-09 ENCOUNTER — Encounter: Admit: 2021-09-09 | Discharge: 2021-09-09 | Payer: MEDICARE | Primary: Family

## 2021-09-10 ENCOUNTER — Encounter: Admit: 2021-09-10 | Discharge: 2021-09-10 | Payer: MEDICARE | Primary: Family

## 2021-09-11 ENCOUNTER — Encounter: Admit: 2021-09-11 | Discharge: 2021-09-11 | Payer: MEDICARE | Primary: Family

## 2021-09-17 ENCOUNTER — Encounter: Admit: 2021-09-17 | Discharge: 2021-09-17 | Payer: MEDICARE | Primary: Family

## 2021-09-20 ENCOUNTER — Ambulatory Visit: Admit: 2021-09-20 | Discharge: 2021-09-20 | Payer: MEDICARE | Primary: Family

## 2021-09-20 ENCOUNTER — Encounter: Admit: 2021-09-20 | Discharge: 2021-09-20 | Payer: MEDICARE | Primary: Family

## 2021-09-20 DIAGNOSIS — I350 Nonrheumatic aortic (valve) stenosis: Secondary | ICD-10-CM

## 2021-09-20 DIAGNOSIS — R011 Cardiac murmur, unspecified: Secondary | ICD-10-CM

## 2021-09-20 DIAGNOSIS — I10 Essential (primary) hypertension: Secondary | ICD-10-CM

## 2021-09-20 DIAGNOSIS — I351 Nonrheumatic aortic (valve) insufficiency: Secondary | ICD-10-CM

## 2021-09-20 DIAGNOSIS — Z952 Presence of prosthetic heart valve: Secondary | ICD-10-CM

## 2021-09-20 DIAGNOSIS — E669 Obesity, unspecified: Secondary | ICD-10-CM

## 2021-09-20 DIAGNOSIS — E781 Pure hyperglyceridemia: Secondary | ICD-10-CM

## 2021-09-20 DIAGNOSIS — J449 Chronic obstructive pulmonary disease, unspecified: Secondary | ICD-10-CM

## 2021-09-20 DIAGNOSIS — N401 Enlarged prostate with lower urinary tract symptoms: Secondary | ICD-10-CM

## 2021-09-20 DIAGNOSIS — Z136 Encounter for screening for cardiovascular disorders: Secondary | ICD-10-CM

## 2021-09-20 DIAGNOSIS — E785 Hyperlipidemia, unspecified: Secondary | ICD-10-CM

## 2021-09-20 DIAGNOSIS — I38 Endocarditis, valve unspecified: Secondary | ICD-10-CM

## 2021-09-20 DIAGNOSIS — Z720 Tobacco use: Secondary | ICD-10-CM

## 2021-09-20 DIAGNOSIS — E7800 Pure hypercholesterolemia, unspecified: Secondary | ICD-10-CM

## 2021-09-20 DIAGNOSIS — N1832 Stage 3b chronic kidney disease (HCC): Secondary | ICD-10-CM

## 2021-09-20 DIAGNOSIS — I4819 Other persistent atrial fibrillation: Secondary | ICD-10-CM

## 2021-09-20 DIAGNOSIS — I340 Nonrheumatic mitral (valve) insufficiency: Secondary | ICD-10-CM

## 2021-09-20 DIAGNOSIS — I5043 Acute on chronic combined systolic (congestive) and diastolic (congestive) heart failure: Secondary | ICD-10-CM

## 2021-09-20 DIAGNOSIS — M179 Osteoarthritis of knee, unspecified: Secondary | ICD-10-CM

## 2021-09-20 DIAGNOSIS — I2510 Atherosclerotic heart disease of native coronary artery without angina pectoris: Secondary | ICD-10-CM

## 2021-09-20 DIAGNOSIS — M109 Gout, unspecified: Secondary | ICD-10-CM

## 2021-09-20 DIAGNOSIS — I4891 Unspecified atrial fibrillation: Secondary | ICD-10-CM

## 2021-09-20 LAB — BASIC METABOLIC PANEL
CHLORIDE: 93 MMOL/L — ABNORMAL LOW (ref 98–110)
CO2: 29 MMOL/L (ref 21–30)
GLUCOSE,PANEL: 80 mg/dL (ref 70–100)
POTASSIUM: 4.7 MMOL/L (ref 3.5–5.1)
SODIUM: 135 MMOL/L — ABNORMAL LOW (ref 137–147)

## 2021-09-20 LAB — CBC
HEMATOCRIT: 39 % — ABNORMAL LOW (ref >60)
HEMOGLOBIN: 12 g/dL — ABNORMAL LOW (ref 13.5–16.5)
MCH: 29 pg (ref 26–34)
MCHC: 32 g/dL (ref 32.0–36.0)
MCV: 91 FL (ref 80–100)
RBC COUNT: 4.3 M/UL — ABNORMAL LOW (ref 4.4–5.5)
RDW: 17 % — ABNORMAL HIGH (ref 11–15)
WBC COUNT: 7.7 K/UL — ABNORMAL HIGH (ref 4.5–11.0)

## 2021-09-20 LAB — BNP (B-TYPE NATRIURETIC PEPTI): BNP: 94 pg/mL — ABNORMAL HIGH (ref 0–100)

## 2021-09-20 MED ORDER — PERFLUTREN LIPID MICROSPHERES 1.1 MG/ML IV SUSP
1-10 mL | Freq: Once | INTRAVENOUS | 0 refills | Status: CP | PRN
Start: 2021-09-20 — End: ?
  Administered 2021-09-20: 20:00:00 2 mL via INTRAVENOUS

## 2021-09-20 MED ORDER — METOPROLOL SUCCINATE 100 MG PO TB24
100 mg | ORAL_TABLET | Freq: Every day | ORAL | 3 refills | 90.00000 days | Status: AC
Start: 2021-09-20 — End: ?

## 2021-09-20 NOTE — Patient Instructions
Follow up with Dr. Sandria Manly in April for routine follow up.      You will return at the one year time frame for echo and office visit to follow up on the valve.      Increase Metoprolol XL to 100mg  daily.

## 2021-09-20 NOTE — Progress Notes
Date of Service: 09/20/2021    Jacob Dickerson is a 75 y.o. male.       HPI     I had the pleasure of seeing Jacob Dickerson for 1 month follow up after valve in valve TAVR.  He is a 75 year old with hypertension, hyperlipidemia, obesity, stage III CKD, persistent A. 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  He had a 27 Thermo fix pericardial aortic valve replacement 2009 by Dr. [name].  He has had multiple heart failure admissions in the last few months.  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    ?  He was admitted on October 24 for volume overload.  Echocardiogram revealed aortic stenosis with mean gradient of 30 mmHg moderate aortic regurgitation.  He had mild mitral regurgitation noted as well.  It was recommended that he undergo evaluation for valve in valve TAVR.  He had a cardiac catheterization that revealed mild nonobstructive coronary disease.  He had a Panorex as part of his work-up that showed dental caries and a small periapical lucency in the left maxillary premolar.  Dental extraction was recommended.  Unfortunately following extraction he had significant bleeding from the gums.  TAVR was postponed until it was controlled.  Ultimately he underwent valve in valve TAVR with a 26 SAPIEN by Dr. Helen Hashimoto and Dr. Riley Nearing on November 17.  His postprocedure echocardiogram revealed a well-seated valve with no stenosis or regurgitation.  The mean gradient was 14 mmHg.  He was discharged with home health on postprocedure day 2.  He had multiple medication changes at discharge.    Today he reports that he continues to note symptomatic improvement.  He is currently living in a nursing facility and tries to walk around the facility as much as possible.  He feels he is less short of breath with activity and has increasing energy.  He denies orthopnea, PND, or lower extremity edema.  He denies chest pain, palpitations, dizziness, lightheadedness, or near syncope.           Vitals:    09/20/21 1349   BP: 130/84   BP Source: Arm, Left Upper   Pulse: 108   SpO2: 95%   O2 Device: None (Room air)   PainSc: Zero   Weight: 124.3 kg (274 lb)   Height: 165.1 cm (5' 5)     Body mass index is 45.6 kg/m?Marland Kitchen     Past Medical History  Patient Active Problem List    Diagnosis Date Noted   ? Age-related physical debility 08/08/2021   ? S/P TAVR (transcatheter aortic valve replacement) 08/08/2021     08/08/21 - Valve in valve TAVR - [name]     ? Atherosclerosis of aorta (HCC) 08/08/2021   ? Acute blood loss anemia 07/31/2021   ? 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 Poor dentition requiring referral to dentistry 07/24/2021   ? Stage 3b chronic kidney disease (HCC) 07/23/2021   ? Chronic anticoagulation 07/23/2021     Eliquis for afib     ? 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, adult (HCC) 07/23/2021   ? Acute on chronic combined systolic and diastolic CHF, NYHA class 2 (HCC) 07/23/2021   ? Mild mitral valve regurgitation 07/23/2021   ? Ruptured chordae tendineae (HCC) 07/23/2021     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 07/23/21     ?  BPH (benign prostatic hyperplasia) 07/23/2021   ? 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 Chronic back pain 07/23/2021   ? Major depressive disorder 07/23/2021   ? Osteoarthritis of knee 07/23/2021   ? Gout 07/23/2021   ? Cardiomegaly 07/23/2021   ? Edema 07/15/2021   ? Morbid obesity with BMI of 40.0-44.9, adult (HCC) 05/29/2021   ? 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 Demand ischemia (HCC) 05/29/2021   ? Acute respiratory failure with hypoxia (HCC) 05/29/2021   ? Coronary artery disease involving native coronary artery of native heart without angina pectoris 05/29/2021   ? Acute kidney injury (HCC) 05/29/2021   ? Stage 3 chronic kidney disease (HCC) 05/29/2021   ? Atrial fibrillation (HCC) 05/14/2021     On Eliquis     ? Wrist fracture, left 03/03/2011     - has full ROM.   Occasional pain in it but able to use it full time.     ? Primary hypertension 12/17/2010   ? Hyperlipidemia 12/17/2010   ? Gout attack 11/25/2010   ? Bladder stone 06/29/2009   ? S/P AVR (aortic valve replacement) 12/31/2007     01/08/08 [facility] - Dr [name]  using porcine-type valve  03/31/08 Echo:EF 55%. mild left ventricular hypertrophy, 17mm gradient across the aortic valve with mildly enlarged aortic root.    10/18/15- [facility]- Lexiscan MPI- Normal LV perfusion. Imaging is negative for ischemia. Global LV systolic function normal with EF 64%. Normal wall motion. Small, fixed, basal- inferolateral defect.  11/12/17- [facility]- Echo- Mild concentric LV hypertrophy. Normal LV function EF 55-60%. Well seated bioprosthetic valve. Mild tricuspid insufficiency. Mildly dilated aortic root.         ? Sprain and strain of other specified sites of shoulder and upper arm 02/02/2007         Review of Systems   Constitutional: Negative.   HENT: Negative.    Eyes: Negative.    Cardiovascular: Negative.    Respiratory: Negative.    Endocrine: Negative.    Hematologic/Lymphatic: Negative.    Skin: Negative.    Musculoskeletal: Negative.    Gastrointestinal: Negative.    Genitourinary: Negative.    Neurological: Negative.    Psychiatric/Behavioral: Negative.    Allergic/Immunologic: Negative.        Physical Exam  General Appearance: no acute distress  Skin: warm & intact  HEENT: unremarkable  Neck Veins: neck veins are flat & not distended  Chest Inspection: chest is normal in appearance  Auscultation/Percussion: lungs clear to auscultation, no rales, rhonchi, or wheezing  Cardiac Rhythm: regular rhythm & normal rate  Cardiac Auscultation: Normal S1 & S2, no S3 or S4, no rub  Murmurs: no cardiac murmurs   Extremities: no lower extremity edema  Abdominal Exam: soft, non-tender, no masses, bowel sounds normal  Liver & Spleen: no organomegaly  Neurologic Exam: oriented to time, place and person; no focal neurologic deficits  Psychiatric: Normal mood and affect.  Behavior is normal. Judgment and thought content normal.         Cardiovascular Studies  Preliminary EKG: Atrial fibrillation, ventricular rate 108    Cardiovascular Health Factors  Vitals BP Readings from Last 3 Encounters:   09/20/21 130/84   09/20/21 135/82   09/02/21 96/55     Wt Readings from Last 3 Encounters:   09/20/21 124.3 kg (274 lb)   09/20/21 127 kg (280 lb)   09/02/21 122.5 kg (270 lb)  BMI Readings from Last 3 Encounters:   09/20/21 45.60 kg/m?   09/20/21 46.59 kg/m?   09/02/21 44.93 kg/m?      Smoking Social History     Tobacco Use   Smoking Status Former   ? Packs/day: 0.25   ? Years: 30.00   ? Pack years: 7.50   ? Types: Cigarettes   ? Quit date: 05/11/2009   ? Years since quitting: 12.3   Smokeless Tobacco Never   Tobacco Comments    quit 2008      Lipid Profile Cholesterol   Date Value Ref Range Status   07/17/2021 115 <200 MG/DL Final     HDL   Date Value Ref Range Status   07/17/2021 20 (L) >40 MG/DL Final     LDL   Date Value Ref Range Status   07/17/2021 64 <100 mg/dL Final     Triglycerides   Date Value Ref Range Status   07/17/2021 219 (H) <150 MG/DL Final      Blood Sugar Hemoglobin A1C   Date Value Ref Range Status   07/16/2021 5.6 4.0 - 6.0 % Final     Comment:     The ADA recommends that most patients with type 1 and type 2 diabetes maintain   an A1c level <7%.       Glucose   Date Value Ref Range Status   08/10/2021 120 (H) 70 - 100 MG/DL Final   02/72/5366 440 (H) 70 - 100 MG/DL Final   34/74/2595 638 (H) 70 - 100 MG/DL Final   75/64/3329 88 65 - 99 mg/dL Final     Comment:                   Fasting reference interval      04/17/2006 89 70 - 110 MG/DL Final   51/88/4166 86 70 - 110 MG/DL Final     Glucose, POC   Date Value Ref Range Status   01/02/2008 117 (H) 70 - 110 MG/DL Final   03/21/1600 093 (H) 70 - 110 MG/DL Final   23/55/7322 025 (H) 70 - 110 MG/DL Final          Problems Addressed Today  Encounter Diagnoses   Name Primary?   ? Screening for heart disease Yes   ? S/P TAVR (transcatheter aortic valve replacement)    ? Nonrheumatic aortic valve insufficiency    ? S/P AVR (aortic valve replacement)    ? Primary hypertension    ? Pure hypercholesterolemia    ? Coronary artery disease involving native coronary artery of native heart without angina pectoris    ? Acute on chronic combined systolic and diastolic CHF, NYHA class 2 (HCC)    ? Morbid obesity with BMI of 40.0-44.9, adult (HCC)    ? Stage 3b chronic kidney disease (HCC)    ? 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 Mild mitral valve regurgitation    ? Persistent atrial fibrillation (HCC)        Assessment and Plan     Bioprosthetic aortic valve dysfunction  He underwent valve in valve TAVR with a 26 SAPIEN valve.  He has had significant symptomatic improvement.  I have encouraged him to gradually increase his activity as tolerated.  He will return for 1 year TAVR follow-up with echo Doppler and office visit as part of the TV[facility] registry.  I have requested a follow-up with Dr. Sandria Manly in April.    Persistent  atrial fibrillation  Metoprolol was increased to 50 mg daily at his last visit.  His heart rates are moderately elevated today and I have asked him to increase metoprolol to 100 mg daily.  He is on apixaban for anticoagulation.    Chronic diastolic heart failure  His volume status is stable.  He describes NYHA class II symptoms.    Hypertension  His blood pressure is optimally controlled.    CKD stage IIIa    I appreciate the opportunity to participate in his care.    Dorena Cookey, APRN  Pager [phone]         Current Medications (including today's revisions)  ? acetaminophen (TYLENOL EXTRA STRENGTH) 500 mg tablet Take 500 mg by mouth every 4 hours as needed for Pain. Max of 4,000 mg of acetaminophen in 24 hours.   ? allopurinol (ZYLOPRIM) 100 mg tablet Take 1 Tab by mouth twice daily.   ? aluminum & magnesium hydroxide/simethicone (MAG-AL PLUS XS) 400/400/40 mg/5 mL suspension Take 15 mL by mouth every 6 hours as needed.   ? apixaban (ELIQUIS) 5 mg tablet Take 5 mg by mouth twice daily.   ? aspirin 81 mg chewable tablet Chew one tablet by mouth daily. Take with food.   ? atorvastatin (LIPITOR) 10 mg tablet Take 1 Tab by mouth daily.   ? dapagliflozin (FARXIGA) 10 mg tablet Take one tablet by mouth daily. Indications: chronic kidney disease with albuminuria, Heart failure with preserved ejection fraction   ? diclofenac sodium (VOLTAREN) 1 % topical gel Apply  topically to affected area four times daily.   ? divalproex (DEPAKOTE EC) 250 mg DR tablet Take 750 mg by mouth at bedtime daily. Take with food.   ? docusate (COLACE) 100 mg capsule Take 100 mg by mouth twice daily as needed.   ? fluticasone propionate (FLONASE) 50 mcg/actuation nasal spray, suspension Apply 2 sprays to each nostril as directed daily. Shake bottle gently before using.   ? levothyroxine (SYNTHROID) 75 mcg tablet Take 75 mcg by mouth daily 30 minutes before breakfast.   ? magnesium hydroxide (MILK OF MAGNESIA PO) Take 30 mL by mouth as Needed.   ? metOLazone (ZAROXOLYN) 2.5 mg tablet Take one tablet by mouth twice weekly. Monday and Friday every week   ? metoprolol succinate XL (TOPROL XL) 100 mg extended release tablet Take one tablet by mouth daily.   ? neomycin/polymyxin/gramicidin 1.75 mg-10,000 unit-0.025mg /mL ophthalmic sol Place  into or around eye(s) daily.   ? omeprazole DR (PRILOSEC) 40 mg capsule Take 40 mg by mouth daily.   ? senna/docusate (SENOKOT-S) 8.6/50 mg tablet Take two tablets by mouth twice daily.   ? sertraline (ZOLOFT) 50 mg tablet Take 50 mg by mouth daily.   ? spironolactone (ALDACTONE) 25 mg tablet Take one-half tablet by mouth daily. Take with food.   ? tamsulosin (FLOMAX) 0.4 mg capsule Take 0.4 mg by mouth daily. Do not crush, chew or open capsules. Take 30 minutes following the same meal each day.   ? torsemide (DEMADEX) 20 mg tablet Take two tablets by mouth daily with breakfast AND one tablet daily with dinner.

## 2021-12-05 ENCOUNTER — Encounter: Admit: 2021-12-05 | Discharge: 2021-12-05 | Payer: MEDICARE | Primary: Family

## 2022-01-10 ENCOUNTER — Encounter: Admit: 2022-01-10 | Discharge: 2022-01-10 | Payer: MEDICARE | Primary: Family

## 2022-01-13 ENCOUNTER — Ambulatory Visit: Admit: 2022-01-13 | Discharge: 2022-01-13 | Payer: MEDICARE | Primary: Family

## 2022-01-13 ENCOUNTER — Encounter: Admit: 2022-01-13 | Discharge: 2022-01-13 | Payer: MEDICARE | Primary: Family

## 2022-01-13 ENCOUNTER — Ambulatory Visit: Admit: 2022-01-13 | Discharge: 2022-01-14 | Payer: MEDICARE | Primary: Family

## 2022-01-13 DIAGNOSIS — I4891 Unspecified atrial fibrillation: Secondary | ICD-10-CM

## 2022-01-13 DIAGNOSIS — E785 Hyperlipidemia, unspecified: Secondary | ICD-10-CM

## 2022-01-13 DIAGNOSIS — M179 Osteoarthritis of knee, unspecified: Secondary | ICD-10-CM

## 2022-01-13 DIAGNOSIS — N401 Enlarged prostate with lower urinary tract symptoms: Secondary | ICD-10-CM

## 2022-01-13 DIAGNOSIS — R011 Cardiac murmur, unspecified: Secondary | ICD-10-CM

## 2022-01-13 DIAGNOSIS — I350 Nonrheumatic aortic (valve) stenosis: Secondary | ICD-10-CM

## 2022-01-13 DIAGNOSIS — M109 Gout, unspecified: Secondary | ICD-10-CM

## 2022-01-13 DIAGNOSIS — Z720 Tobacco use: Secondary | ICD-10-CM

## 2022-01-13 DIAGNOSIS — E669 Obesity, unspecified: Secondary | ICD-10-CM

## 2022-01-13 DIAGNOSIS — E781 Pure hyperglyceridemia: Secondary | ICD-10-CM

## 2022-01-13 DIAGNOSIS — N1831 Stage 3a chronic kidney disease (HCC): Secondary | ICD-10-CM

## 2022-01-13 DIAGNOSIS — I38 Endocarditis, valve unspecified: Secondary | ICD-10-CM

## 2022-01-13 LAB — PARATHYROID HORMONE: PTH HORMONE: 89 pg/mL — ABNORMAL HIGH (ref 10–65)

## 2022-01-13 LAB — BASIC METABOLIC PANEL
ANION GAP: 14 — ABNORMAL HIGH (ref 3–12)
CHLORIDE: 97 MMOL/L — ABNORMAL LOW (ref 98–110)
CO2: 28 MMOL/L (ref 21–30)
GLUCOSE,PANEL: 83 mg/dL (ref 70–100)
POTASSIUM: 4.3 MMOL/L (ref 3.5–5.1)
SODIUM: 139 MMOL/L (ref 137–147)

## 2022-01-13 LAB — PHOSPHORUS: PHOSPHORUS: 3.6 mg/dL (ref 2.0–4.5)

## 2022-01-13 LAB — 25-OH VITAMIN D (D2 + D3): VITAMIN D (25-OH) TOTAL: 25 ng/mL — ABNORMAL LOW (ref 30–80)

## 2022-01-13 NOTE — Patient Instructions
Please stop by the lab for blood work  If you have any questions or concerns, you can reach me best with a MyChart message or through my nurse, Hannah, at [phone].

## 2022-01-14 ENCOUNTER — Encounter
Admit: 2022-01-14 | Discharge: 2022-01-14 | Payer: MEDICARE | Primary: Student in an Organized Health Care Education/Training Program

## 2022-01-14 DIAGNOSIS — Z720 Tobacco use: Secondary | ICD-10-CM

## 2022-01-14 DIAGNOSIS — E781 Pure hyperglyceridemia: Secondary | ICD-10-CM

## 2022-01-14 DIAGNOSIS — M109 Gout, unspecified: Secondary | ICD-10-CM

## 2022-01-14 DIAGNOSIS — E785 Hyperlipidemia, unspecified: Secondary | ICD-10-CM

## 2022-01-14 DIAGNOSIS — I350 Nonrheumatic aortic (valve) stenosis: Secondary | ICD-10-CM

## 2022-01-14 DIAGNOSIS — M179 Osteoarthritis of knee, unspecified: Secondary | ICD-10-CM

## 2022-01-14 DIAGNOSIS — N401 Enlarged prostate with lower urinary tract symptoms: Secondary | ICD-10-CM

## 2022-01-14 DIAGNOSIS — Z952 Presence of prosthetic heart valve: Secondary | ICD-10-CM

## 2022-01-14 DIAGNOSIS — E669 Obesity, unspecified: Secondary | ICD-10-CM

## 2022-01-14 DIAGNOSIS — I4891 Unspecified atrial fibrillation: Secondary | ICD-10-CM

## 2022-01-14 DIAGNOSIS — I4819 Other persistent atrial fibrillation: Secondary | ICD-10-CM

## 2022-01-14 DIAGNOSIS — R011 Cardiac murmur, unspecified: Secondary | ICD-10-CM

## 2022-01-14 DIAGNOSIS — I351 Nonrheumatic aortic (valve) insufficiency: Secondary | ICD-10-CM

## 2022-01-14 DIAGNOSIS — N1831 Chronic kidney disease, stage 3a (HCC): Secondary | ICD-10-CM

## 2022-01-14 DIAGNOSIS — I38 Endocarditis, valve unspecified: Secondary | ICD-10-CM

## 2022-01-14 DIAGNOSIS — I5043 Acute on chronic combined systolic (congestive) and diastolic (congestive) heart failure: Secondary | ICD-10-CM

## 2022-01-14 MED ORDER — ATORVASTATIN 10 MG PO TAB
10 mg | ORAL_TABLET | Freq: Every day | ORAL | 3 refills | Status: AC
Start: 2022-01-14 — End: ?

## 2022-01-14 NOTE — Patient Instructions
Thank you for visiting our office today.    We would like to make the following medication adjustments:      Restart atorvastatin 10 mg daily       Otherwise continue the same medications as you have been doing.          We will be pursuing the following tests after your appointment today:      Echo in about 5 months      We will plan to see you back in 6 months.  Please call us in the meantime with any questions or concerns.        Please allow 5-7 business days for our providers to review your results. All normal results will go to MyChart. If you do not have Mychart, it is strongly recommended to get this so you can easily view all your results. If you do not have mychart, we will attempt to call you once with normal lab and testing results. If we cannot reach you by phone with normal results, we will send you a letter.  If you have not heard the results of your testing after one week please give [place] a call.       Your Cardiovascular [facility] [place] Team Brett Canales, Pilar Jarvis and [place])  phone number is [phone].

## 2022-01-27 ENCOUNTER — Encounter
Admit: 2022-01-27 | Discharge: 2022-01-27 | Payer: MEDICARE | Primary: Student in an Organized Health Care Education/Training Program

## 2022-01-27 NOTE — Telephone Encounter
Jacob Dickerson called into the nursing line and states that he has been having issues with weight gain and swelling.  He states that since he saw Dr. Sandria Manly on 4/25, he has had a 16 pound weight gain and is having lots of swelling in his feet, ankles and legs.  He denies any shortness of breath or chest pain.  He denies any other issues.  He is taking his medications as prescribed.  I recommended that Jacob Dickerson go to the [facility] ED for evaluation and treatment as that is a significant amount of weight gain.  He is agreeable to plan.  Report called to [facility].  Will callback with any questions, concerns or problems.

## 2022-01-28 ENCOUNTER — Encounter
Admit: 2022-01-28 | Discharge: 2022-01-28 | Payer: MEDICARE | Primary: Student in an Organized Health Care Education/Training Program

## 2022-01-28 NOTE — Progress Notes
76 yr old male  PMH: HTN, afib, CHF, HLD, stage 3 CKD, CAD  Seen 2 days ago -fluid retention- lasix-diuresis-991ml off  Back in ER today- still feels full of fluid, edematous  Patient states he is 16 lbs over dry weight  Received 40mg  IV lasix in ER- 400 out so far  EKG - rate 100 afib, no acute issues  CXR - cardiomegaly, vascular congestion  Troponin negative  BNP negative  WBC 6.9  Hgb 13.6  K 3.6  BUN 33  Cr 1.6  129/66, 110 afib, 16, 95% RA, 99  A&O

## 2022-01-29 ENCOUNTER — Encounter
Admit: 2022-01-29 | Discharge: 2022-01-29 | Payer: MEDICARE | Primary: Student in an Organized Health Care Education/Training Program

## 2022-01-29 ENCOUNTER — Ambulatory Visit
Admit: 2022-01-29 | Discharge: 2022-01-29 | Payer: MEDICARE | Primary: Student in an Organized Health Care Education/Training Program

## 2022-01-29 DIAGNOSIS — R609 Edema, unspecified: Secondary | ICD-10-CM

## 2022-01-29 DIAGNOSIS — Z952 Presence of prosthetic heart valve: Secondary | ICD-10-CM

## 2022-01-29 DIAGNOSIS — I4891 Unspecified atrial fibrillation: Secondary | ICD-10-CM

## 2022-01-31 ENCOUNTER — Encounter
Admit: 2022-01-31 | Discharge: 2022-01-31 | Payer: MEDICARE | Primary: Student in an Organized Health Care Education/Training Program

## 2022-01-31 NOTE — Telephone Encounter
Recent discharge from [facility].  Continued edema encouraged compliance with medications, leg elevation and compression stockings.  Pt to see pcp 5/16.  Informed of appt with [facility].

## 2022-02-03 ENCOUNTER — Encounter
Admit: 2022-02-03 | Discharge: 2022-02-03 | Payer: MEDICARE | Primary: Student in an Organized Health Care Education/Training Program

## 2022-02-03 NOTE — Telephone Encounter
Jacob Dickerson called into the nursing line to report that his weight is up again today about 4 pounds.  He was discharged from [facility] last week with weight gain and swelling.  Jacob Dickerson states that he is still having swelling in his legs and into his testicles.  His weight was down to 292, but is back up to 296.  He denies any shortness of breath, but continues to have more swelling in his legs and feet.  He continues to have water blisters on his right leg.    Jacob Dickerson said he he has not been eating any salt and has been limiting his fluids.  He has been elevating his feet but has not had any relief of his symptoms.  He did not have a positive experience with his recent inpatient stay at [facility].  Offered patient appt tomorrow with [facility] in [place].  Jacob Dickerson confirmed appt time and location. He will bring in his medication bottles to ensure that we have his medications correct.  Pt Will callback with any questions, concerns or problems.    Will route to Dr. Sandria Manly for any additional recommendations.

## 2022-02-04 ENCOUNTER — Encounter
Admit: 2022-02-04 | Discharge: 2022-02-04 | Payer: MEDICARE | Primary: Student in an Organized Health Care Education/Training Program

## 2022-02-04 DIAGNOSIS — E785 Hyperlipidemia, unspecified: Secondary | ICD-10-CM

## 2022-02-04 DIAGNOSIS — Z09 Encounter for follow-up examination after completed treatment for conditions other than malignant neoplasm: Secondary | ICD-10-CM

## 2022-02-04 DIAGNOSIS — Z720 Tobacco use: Secondary | ICD-10-CM

## 2022-02-04 DIAGNOSIS — N1831 Stage 3a chronic kidney disease (HCC): Secondary | ICD-10-CM

## 2022-02-04 DIAGNOSIS — I4819 Other persistent atrial fibrillation: Secondary | ICD-10-CM

## 2022-02-04 DIAGNOSIS — I38 Endocarditis, valve unspecified: Secondary | ICD-10-CM

## 2022-02-04 DIAGNOSIS — M179 Osteoarthritis of knee, unspecified: Secondary | ICD-10-CM

## 2022-02-04 DIAGNOSIS — E669 Obesity, unspecified: Secondary | ICD-10-CM

## 2022-02-04 DIAGNOSIS — R7989 Other specified abnormal findings of blood chemistry: Secondary | ICD-10-CM

## 2022-02-04 DIAGNOSIS — I4891 Unspecified atrial fibrillation: Secondary | ICD-10-CM

## 2022-02-04 DIAGNOSIS — I351 Nonrheumatic aortic (valve) insufficiency: Secondary | ICD-10-CM

## 2022-02-04 DIAGNOSIS — I5043 Acute on chronic combined systolic (congestive) and diastolic (congestive) heart failure: Secondary | ICD-10-CM

## 2022-02-04 DIAGNOSIS — I350 Nonrheumatic aortic (valve) stenosis: Secondary | ICD-10-CM

## 2022-02-04 DIAGNOSIS — E781 Pure hyperglyceridemia: Secondary | ICD-10-CM

## 2022-02-04 DIAGNOSIS — R011 Cardiac murmur, unspecified: Secondary | ICD-10-CM

## 2022-02-04 DIAGNOSIS — Z952 Presence of prosthetic heart valve: Secondary | ICD-10-CM

## 2022-02-04 DIAGNOSIS — N401 Enlarged prostate with lower urinary tract symptoms: Secondary | ICD-10-CM

## 2022-02-04 DIAGNOSIS — I10 Essential (primary) hypertension: Secondary | ICD-10-CM

## 2022-02-04 DIAGNOSIS — M109 Gout, unspecified: Secondary | ICD-10-CM

## 2022-02-04 MED ORDER — POTASSIUM CHLORIDE 20 MEQ PO TBTQ
40 meq | ORAL_TABLET | Freq: Every day | ORAL | 3 refills | 30.00000 days | Status: AC
Start: 2022-02-04 — End: ?

## 2022-02-04 MED ORDER — TORSEMIDE 20 MG PO TAB
ORAL_TABLET | ORAL | 3 refills | 67.50000 days | Status: AC
Start: 2022-02-04 — End: ?
  Filled 2022-03-03: qty 60, 30d supply, fill #1

## 2022-02-04 NOTE — Progress Notes
Date of Service: 02/04/2022    Jacob Dickerson is a 76 y.o. white  male.       HPI     Jacob Dickerson is a 76 y.o. 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  Patient was admitted at a local hospital in [place], [place] he was treated with IV Lasix and discharged home initially from the emergency room department.    Patient return to the ED the following day for bilateral edema, IV diuresis was continued.    Patient reports that he continues to experience shortness of breath and bilateral lower extremity edema, there is also a faint erythematous rash more pronounced on the right lower extremity.  He is currently taking torsemide 40 mg p.o. every morning and 20 mg p.o. every afternoon in conjunction with spironolactone, he does not think his lower extremity edema and symptoms have been much relieved.    He does not report chest pain.         Vitals:    02/04/22 1024   O2 Device: None (Room air)   PainSc: Zero   Height: 165.1 cm (5' 5)     Body mass index is 49.42 kg/m?Marland Kitchen     Past Medical History  Patient Active Problem List    Diagnosis Date Noted   ? Age-related physical debility 08/08/2021   ? S/P TAVR (transcatheter aortic valve replacement) 08/08/2021     08/08/21 - Valve in valve TAVR - [name]     ? Atherosclerosis of aorta (HCC) 08/08/2021   ? Acute blood loss anemia 07/31/2021   ? 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 Poor dentition requiring referral to dentistry 07/24/2021   ? Stage 3b chronic kidney disease (HCC) 07/23/2021   ? Chronic anticoagulation 07/23/2021     Eliquis for afib     ? 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, adult (HCC) 07/23/2021   ? Acute on chronic combined systolic and diastolic CHF, NYHA class 2 (HCC) 07/23/2021   ? Mild mitral valve regurgitation 07/23/2021   ? Ruptured chordae tendineae (HCC) 07/23/2021     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 07/23/21     ? BPH (benign prostatic hyperplasia) 07/23/2021   ? 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 Chronic back pain 07/23/2021   ? Major depressive disorder 07/23/2021   ? Osteoarthritis of knee 07/23/2021   ? Gout 07/23/2021   ? Cardiomegaly 07/23/2021   ? Edema 07/15/2021   ? Morbid obesity with BMI of 40.0-44.9, adult (HCC) 05/29/2021   ? 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 Demand ischemia (HCC) 05/29/2021   ? Acute respiratory failure with hypoxia (HCC) 05/29/2021   ? Coronary artery disease involving native coronary artery of native heart without angina pectoris 05/29/2021   ? Acute kidney injury (HCC) 05/29/2021   ? Stage 3 chronic kidney disease (HCC) 05/29/2021   ? Atrial fibrillation (HCC) 05/14/2021     On Eliquis     ?  Wrist fracture, left 03/03/2011     - has full ROM.   Occasional pain in it but able to use it full time.     ? Primary hypertension 12/17/2010   ? Hyperlipidemia 12/17/2010   ? Gout attack 11/25/2010   ? Bladder stone 06/29/2009   ? S/P AVR (aortic valve replacement) 12/31/2007     01/08/08 [place] - Dr [name]  using porcine-type valve  03/31/08 Echo:EF 55%. mild left ventricular hypertrophy, 17mm gradient across the aortic valve with mildly enlarged aortic root.    10/18/15- [facility]- Lexiscan MPI- Normal LV perfusion. Imaging is negative for ischemia. Global LV systolic function normal with EF 64%. Normal wall motion. Small, fixed, basal- inferolateral defect.  11/12/17- [facility]- Echo- Mild concentric LV hypertrophy. Normal LV function EF 55-60%. Well seated bioprosthetic valve. Mild tricuspid insufficiency. Mildly dilated aortic root.         ? Sprain and strain of other specified sites of shoulder and upper arm 02/02/2007         Review of Systems   Constitutional: Negative.   HENT: Negative.    Eyes: Negative.    Cardiovascular: Negative.    Respiratory: Negative.    Endocrine: Negative.    Hematologic/Lymphatic: Negative.    Skin: Negative.    Musculoskeletal: Negative.    Gastrointestinal: Negative.    Genitourinary: Negative.    Neurological: Negative.    Psychiatric/Behavioral: Negative.    Allergic/Immunologic: Negative.        Physical Exam  General Appearance: Morbidly obese, BMI = 49.42 kg/m?  Skin: warm, moist, no ulcers or xanthomas  Eyes: conjunctivae and lids normal, pupils are equal and round  Lips & Oral Mucosa: no pallor or cyanosis  Neck Veins: neck veins are flat, neck veins are not distended  Chest Inspection: chest is normal in appearance  Respiratory Effort: breathing comfortably, no respiratory distress  Auscultation/Percussion: lungs clear to auscultation, no rales or rhonchi, no wheezing  Cardiac Rhythm: Irregularly irregular rhythm, slightly tachycardic  Cardiac Auscultation: S1, S2 normal, no rub, no gallop  Murmurs: no murmur  Carotid Arteries: normal carotid upstroke bilaterally, no bruit  Abdominal Aorta: no abdominal aortic bruit  Lower Extremity Edema: 2+ bilateral lower extremity edema, RLE > LLE  Abdominal Exam: soft, non-tender, no masses, bowel sounds normal  Liver & Spleen: no organomegaly  Gait & Station: normal balance and gait  Muscle Strength: normal strength and tone  Back: normal alignment and mobility, no deformity  Orientation: oriented to time, place and person  Affect & Mood: appropriate and sustained affect  Language and Memory: patient responsive and seems to comprehend information  Neurologic Exam: neurological assessment grossly intact           Cardiovascular Studies      Cardiovascular Health Factors  Vitals BP Readings from Last 3 Encounters:   01/29/22 107/58   01/14/22 114/68   01/13/22 (P) 131/84     Wt Readings from Last 3 Encounters:   01/29/22 134.7 kg (297 lb)   01/14/22 129.3 kg (285 lb)   01/13/22 127 kg (280 lb)     BMI Readings from Last 3 Encounters:   02/04/22 49.42 kg/m?   01/29/22 49.42 kg/m?   01/14/22 47.43 kg/m?      Smoking Social History     Tobacco Use   Smoking Status Former   ? Packs/day: 0.25   ? Years: 30.00   ? Pack years: 7.50   ? Types: Cigarettes   ? Quit date:  05/11/2009   ? Years since quitting: 12.7   Smokeless Tobacco Never   Tobacco Comments    quit 2008   Vaping Use   ? Vaping Use: Never used      Lipid Profile Cholesterol   Date Value Ref Range Status   08/30/2021 143  Final     HDL   Date Value Ref Range Status   08/30/2021 26 (L) >=40 Final     LDL   Date Value Ref Range Status   08/30/2021 71  Final     Triglycerides   Date Value Ref Range Status   08/30/2021 232 (H) <150 Final      Blood Sugar Hemoglobin A1C   Date Value Ref Range Status   07/16/2021 5.6 4.0 - 6.0 % Final     Comment:     The ADA recommends that most patients with type 1 and type 2 diabetes maintain   an A1c level <7%.       Glucose   Date Value Ref Range Status   01/29/2022 101  Final   01/28/2022 121 (H) 70 - 105 Final   01/13/2022 83 70 - 100 MG/DL Final   16/06/9603 88 65 - 99 mg/dL Final     Comment:                   Fasting reference interval      04/17/2006 89 70 - 110 MG/DL Final   54/05/8118 86 70 - 110 MG/DL Final     Glucose, POC   Date Value Ref Range Status   01/02/2008 117 (H) 70 - 110 MG/DL Final   14/78/2956 213 (H) 70 - 110 MG/DL Final   08/65/7846 962 (H) 70 - 110 MG/DL Final          Problems Addressed Today  Encounter Diagnoses   Name Primary?   ? Persistent atrial fibrillation (HCC) Yes   ? Morbid obesity with BMI of 40.0-44.9, adult (HCC)    ? Primary hypertension    ? Elevated brain natriuretic peptide (BNP) level    ? S/P AVR (aortic valve replacement)    ? Nonrheumatic aortic valve insufficiency    ? Acute on chronic combined systolic and diastolic heart failure (HCC)    ? Stage 3a chronic kidney disease (HCC)    ? 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, adult (HCC)    ? Hospital discharge follow-up    ? S/P TAVR (transcatheter aortic valve replacement)        Assessment and Plan     Assessment:    1.  Chronic, combined systolic/diastolic heart failure  ? The most recent echocardiogram dated 01/29/2022 demonstrated LVEF = 35% global hypokinesis  2.  Hospital discharge follow-up  ? Patient was initially evaluated in the emergency room department and then briefly admitted at the local hospital, he did receive furosemide IV with modest resolution of the lower extremity edema  3.  Chronic bilateral lower extremity edema due to #1  ? Patient is noncompliant with a low-sodium diet  4.  Atrial fibrillation-probably permanent by now, he is slightly tachycardic, patient continues on apixaban  5.  History of bicuspid aortic valve with subsequent severe regurgitation  6.  Status post AVR with #27 Thermo fix pericardial aortic valve in 2009-patient did develop severe valve regurgitation  7.  Status post redo AVR with #26 SAPIEN TAVR in 2022  Most recent echocardiogram on 01/29/2022 demonstrated normal MG = 10 mmHg  8.  Morbid obesity-BMI  49.42 kg/m?  9.  CKD  10.  Poor functional status due to multiple comorbidities listed above    Plan:    1.  Increase torsemide to 60 mg (3 tablets) p.o. twice daily  2.  Continue spironolactone 25 mg p.o. daily  3.  Start potassium chloride 40 mEq p.o. daily  4.  Check Chem-7 on Thursday, 02/06/2022  5.  Follow-up in [place] office with Dr. Sandria Manly next week    Total Time Today was 40 minutes in the following activities: Preparing to see the patient, Obtaining and/or reviewing separately obtained history, Performing a medically appropriate examination and/or evaluation, Counseling and educating the patient/family/caregiver, Ordering medications, tests, or procedures, Referring and communication with other health care professionals (when not separately reported), Documenting clinical information in the electronic or other health record, Independently interpreting results (not separately reported) and communicating results to the patient/family/caregiver and Care coordination (not separately reported)         Current Medications (including today's revisions)  ? acetaminophen (TYLENOL EXTRA STRENGTH) 500 mg tablet Take one tablet by mouth every 4 hours as needed for Pain. Max of 4,000 mg of acetaminophen in 24 hours.   ? allopurinol (ZYLOPRIM) 100 mg tablet Take 1 Tab by mouth twice daily.   ? apixaban (ELIQUIS) 5 mg tablet Take one tablet by mouth twice daily.   ? aspirin 81 mg chewable tablet Chew one tablet by mouth daily. Take with food.   ? atorvastatin (LIPITOR) 10 mg tablet Take one tablet by mouth daily.   ? clotrimazole-betamethasone (LOTRISONE) 1-0.05 % topical cream Apply  topically to affected area twice daily.   ? divalproex (DEPAKOTE EC) 250 mg DR tablet Take three tablets by mouth at bedtime daily. Take with food.   ? docusate (COLACE) 100 mg capsule Take one capsule by mouth twice daily as needed.   ? gabapentin (NEURONTIN) 100 mg capsule Take one capsule by mouth three times daily.   ? levothyroxine (SYNTHROID) 75 mcg tablet Take one tablet by mouth daily 30 minutes before breakfast.   ? magnesium hydroxide (MILK OF MAGNESIA PO) Take 30 mL by mouth as Needed.   ? metoprolol succinate XL (TOPROL XL) 100 mg extended release tablet Take one tablet by mouth daily.   ? sertraline (ZOLOFT) 50 mg tablet Take one tablet by mouth daily.   ? spironolactone (ALDACTONE) 25 mg tablet Take one-half tablet by mouth daily. Take with food.   ? tamsulosin (FLOMAX) 0.4 mg capsule Take one capsule by mouth daily. Do not crush, chew or open capsules. Take 30 minutes following the same meal each day.   ? torsemide (DEMADEX) 20 mg tablet Take two tablets by mouth daily with breakfast AND one tablet daily with dinner.

## 2022-02-04 NOTE — Patient Instructions
Increase you bumex to 60mg  twice a day  Start of potassium   Check labs prior to Office visit with Dr    Follow up as directed.  Call sooner if issues.  Call the [facility] nursing line at [phone].  Leave a detailed message for the nurse in [place] [facility] with how we can assist you and we will call you back.

## 2022-02-11 ENCOUNTER — Encounter
Admit: 2022-02-11 | Discharge: 2022-02-11 | Payer: MEDICARE | Primary: Student in an Organized Health Care Education/Training Program

## 2022-02-11 DIAGNOSIS — Z720 Tobacco use: Secondary | ICD-10-CM

## 2022-02-11 DIAGNOSIS — R011 Cardiac murmur, unspecified: Secondary | ICD-10-CM

## 2022-02-11 DIAGNOSIS — I4891 Unspecified atrial fibrillation: Secondary | ICD-10-CM

## 2022-02-11 DIAGNOSIS — M109 Gout, unspecified: Secondary | ICD-10-CM

## 2022-02-11 DIAGNOSIS — M179 Osteoarthritis of knee, unspecified: Secondary | ICD-10-CM

## 2022-02-11 DIAGNOSIS — I38 Endocarditis, valve unspecified: Secondary | ICD-10-CM

## 2022-02-11 DIAGNOSIS — I4819 Other persistent atrial fibrillation: Secondary | ICD-10-CM

## 2022-02-11 DIAGNOSIS — E785 Hyperlipidemia, unspecified: Secondary | ICD-10-CM

## 2022-02-11 DIAGNOSIS — N401 Enlarged prostate with lower urinary tract symptoms: Secondary | ICD-10-CM

## 2022-02-11 DIAGNOSIS — E669 Obesity, unspecified: Secondary | ICD-10-CM

## 2022-02-11 DIAGNOSIS — E781 Pure hyperglyceridemia: Secondary | ICD-10-CM

## 2022-02-11 DIAGNOSIS — I350 Nonrheumatic aortic (valve) stenosis: Secondary | ICD-10-CM

## 2022-02-11 DIAGNOSIS — I10 Essential (primary) hypertension: Secondary | ICD-10-CM

## 2022-02-11 LAB — BASIC METABOLIC PANEL
ANION GAP: 12
BLD UREA NITROGEN: 32 — ABNORMAL HIGH (ref 8.4–25.7)
CALCIUM: 9.2
CHLORIDE: 100
CO2: 29
CREATININE: 1.6 — ABNORMAL HIGH (ref 0.72–1.25)
GFR ESTIMATED: 44 — ABNORMAL LOW (ref >59)
GLUCOSE,PANEL: 118 — ABNORMAL HIGH (ref 70–105)
POTASSIUM: 3.9
SODIUM: 141

## 2022-02-12 ENCOUNTER — Encounter
Admit: 2022-02-12 | Discharge: 2022-02-12 | Payer: MEDICARE | Primary: Student in an Organized Health Care Education/Training Program

## 2022-02-12 NOTE — Telephone Encounter
Patient is currently inpatient at [facility] called with concerns for early discharge.  Spoke with nurse.  Updated WTL with progress.  Called patient lmom.

## 2022-02-17 ENCOUNTER — Encounter
Admit: 2022-02-17 | Discharge: 2022-02-17 | Payer: MEDICARE | Primary: Student in an Organized Health Care Education/Training Program

## 2022-02-17 NOTE — Progress Notes
Lipid Management Review    LDL   Date Value Ref Range Status   08/30/2021 71  Final   07/17/2021 64 <100 mg/dL Final   76/81/1572 60  Final     Calcium Score Date & Result: n/a  Current lipid lowering medications: atorvastatin 10mg   Previous lipid lowering medications tried/failed: n/a  Next OV: 04/15/22    Action Items: Patient to get testing completed. Due July FLP.

## 2022-02-18 ENCOUNTER — Encounter
Admit: 2022-02-18 | Discharge: 2022-02-18 | Payer: MEDICARE | Primary: Student in an Organized Health Care Education/Training Program

## 2022-02-18 NOTE — Telephone Encounter
Called to patient.

## 2022-02-18 NOTE — Telephone Encounter
-----   Message from Altamease Oiler, MD sent at 02/13/2022  4:49 PM CDT -----  Regarding: RE: Surgical clearance  How negative was he today?  What was his weight?    I think once he is euvolemic I will clear him for carpal tunnel surgery.  But I like to see some clinical stability for a few weeks before that happens.  I think he should push his surgery off for now.  02-21-22 is too early and I worry about him having decompensated heart failure perioperatively.  ----- Message -----  From: Weston Brass  Sent: 02/13/2022  11:08 AM CDT  To: Altamease Oiler, MD  Subject: Surgical clearance                               Pt continues to diurese at [facility].  He is requesting surgical clearance for carpel tunnel surgery with Dr. Para March on 6/2.  Thanks  Brett Canales

## 2022-02-19 ENCOUNTER — Encounter
Admit: 2022-02-19 | Discharge: 2022-02-19 | Payer: MEDICARE | Primary: Student in an Organized Health Care Education/Training Program

## 2022-02-19 NOTE — Telephone Encounter
Jacob Dickerson called to report that since being discharged from [facility], his weight has been going up about a pound a day.  He states when he was discharged, his weight was 279, but today it was up to 284.  He has noticed that the swelling in his legs is coming back and he has redness in both of his calves all over.  He denies any pain or hot spots in his legs.  He has been following the salt and fluid restriction and is wearing compression socks.  Jacob Dickerson states that he was discharged on  Metolazone 2.5mg  daily in addition to his current medications and has been taking as prescribed.  I have requested his most recent labs from [facility].    Will route to Dr. Sandria Manly for recommendations.

## 2022-02-20 ENCOUNTER — Encounter
Admit: 2022-02-20 | Discharge: 2022-02-20 | Payer: MEDICARE | Primary: Student in an Organized Health Care Education/Training Program

## 2022-02-20 DIAGNOSIS — I5043 Acute on chronic combined systolic (congestive) and diastolic (congestive) heart failure: Secondary | ICD-10-CM

## 2022-02-20 MED ORDER — SPIRONOLACTONE 25 MG PO TAB
12.5 mg | Freq: Every day | ORAL | 0 refills | Status: AC
Start: 2022-02-20 — End: ?
  Administered 2022-02-21 – 2022-03-03 (×9): 12.5 mg via ORAL

## 2022-02-20 MED ORDER — ALLOPURINOL 100 MG PO TAB
100 mg | Freq: Two times a day (BID) | ORAL | 0 refills | Status: DC
Start: 2022-02-20 — End: 2022-02-21
  Administered 2022-02-21: 04:00:00 100 mg via ORAL

## 2022-02-20 MED ORDER — APIXABAN 5 MG PO TAB
5 mg | Freq: Two times a day (BID) | ORAL | 0 refills | Status: AC
Start: 2022-02-20 — End: ?
  Administered 2022-02-21 – 2022-03-03 (×19): 5 mg via ORAL

## 2022-02-20 MED ORDER — ASPIRIN 81 MG PO CHEW
81 mg | Freq: Every day | ORAL | 0 refills | Status: AC
Start: 2022-02-20 — End: ?
  Administered 2022-02-21 – 2022-03-03 (×11): 81 mg via ORAL

## 2022-02-20 MED ORDER — TAMSULOSIN 0.4 MG PO CAP
.4 mg | Freq: Every day | ORAL | 0 refills | Status: AC
Start: 2022-02-20 — End: ?
  Administered 2022-02-21 – 2022-03-03 (×11): 0.4 mg via ORAL

## 2022-02-20 MED ORDER — ATORVASTATIN 10 MG PO TAB
10 mg | Freq: Every day | ORAL | 0 refills | Status: AC
Start: 2022-02-20 — End: ?
  Administered 2022-02-21 – 2022-03-03 (×11): 10 mg via ORAL

## 2022-02-20 MED ORDER — MELATONIN 5 MG PO TAB
5 mg | Freq: Every evening | ORAL | 0 refills | Status: AC | PRN
Start: 2022-02-20 — End: ?
  Administered 2022-02-21 – 2022-03-03 (×6): 5 mg via ORAL

## 2022-02-20 MED ORDER — LEVOTHYROXINE 75 MCG PO TAB
75 ug | Freq: Every day | ORAL | 0 refills | Status: AC
Start: 2022-02-20 — End: ?
  Administered 2022-02-21 – 2022-03-03 (×11): 75 ug via ORAL

## 2022-02-20 MED ORDER — ALLOPURINOL 100 MG PO TAB
50 mg | Freq: Every day | ORAL | 0 refills | Status: AC
Start: 2022-02-20 — End: ?
  Administered 2022-02-22 – 2022-02-26 (×5): 50 mg via ORAL

## 2022-02-20 MED ORDER — IMS MIXTURE TEMPLATE
750 mg | Freq: Every evening | ORAL | 0 refills | Status: AC
Start: 2022-02-20 — End: ?
  Administered 2022-02-22 – 2022-03-03 (×16): 750 mg via ORAL

## 2022-02-20 MED ORDER — SERTRALINE 50 MG PO TAB
50 mg | Freq: Every day | ORAL | 0 refills | Status: AC
Start: 2022-02-20 — End: ?
  Administered 2022-02-21 – 2022-03-03 (×11): 50 mg via ORAL

## 2022-02-20 MED ORDER — POLYETHYLENE GLYCOL 3350 17 GRAM PO PWPK
1 | Freq: Every day | ORAL | 0 refills | Status: AC | PRN
Start: 2022-02-20 — End: ?

## 2022-02-20 MED ORDER — SENNOSIDES-DOCUSATE SODIUM 8.6-50 MG PO TAB
2 | Freq: Every day | ORAL | 0 refills | Status: AC | PRN
Start: 2022-02-20 — End: ?

## 2022-02-20 MED ORDER — METOPROLOL SUCCINATE 100 MG PO TB24
100 mg | Freq: Every day | ORAL | 0 refills | Status: AC
Start: 2022-02-20 — End: ?
  Administered 2022-02-21 – 2022-03-03 (×10): 100 mg via ORAL

## 2022-02-20 MED ORDER — ONDANSETRON HCL (PF) 4 MG/2 ML IJ SOLN
4 mg | INTRAVENOUS | 0 refills | Status: AC | PRN
Start: 2022-02-20 — End: ?

## 2022-02-20 MED ORDER — ONDANSETRON 4 MG PO TBDI
4 mg | ORAL | 0 refills | Status: AC | PRN
Start: 2022-02-20 — End: ?

## 2022-02-20 MED ORDER — ACETAMINOPHEN 500 MG PO TAB
1000 mg | ORAL | 0 refills | Status: AC | PRN
Start: 2022-02-20 — End: ?
  Administered 2022-02-21 – 2022-03-03 (×20): 1000 mg via ORAL

## 2022-02-20 NOTE — Telephone Encounter
Rec'd VM from pt, unable to understand what pt was saying as VM was fading in and out.   Returned pt call, essentially he has had a few ER visits in early May and was eventually admitted on 5/23 at [facility] d/t weight being up to 299lb. Reports when he was d/c'd on Monday, 5/29 his weight was 282lb and they prescribed him metolazone 2.5mg  to take daily. He did see his PCP this AM and his weight was 288.4lb but they didn't make any med changes, sounds like they wanted to wait to see what his labs showed first. Reports feeling SOA on exertion and gets fatigued really quickly. He reports redness in his legs but unable to clarify with him if his legs feel hot to the touch or are painful etc. I advised him that Asher Muir RN with Cardio had left him a message this AM however pt states he is unable to get messages. Advised will send her a message to request she call him so they can discuss sxs and recommendations vs sooner appt with Dr. Sandria Manly. Pt verb understanding and appreciative of return call and of assistance.   Rogelia Boga RN will reach out to pt today to f/u with him.

## 2022-02-21 ENCOUNTER — Inpatient Hospital Stay
Admit: 2022-02-21 | Discharge: 2022-02-21 | Payer: MEDICARE | Primary: Student in an Organized Health Care Education/Training Program

## 2022-02-21 ENCOUNTER — Inpatient Hospital Stay: Admit: 2022-02-21 | Payer: MEDICARE

## 2022-02-21 ENCOUNTER — Encounter
Admit: 2022-02-21 | Discharge: 2022-02-21 | Payer: MEDICARE | Primary: Student in an Organized Health Care Education/Training Program

## 2022-02-21 MED ADMIN — POTASSIUM CHLORIDE IN WATER 10 MEQ/50 ML IV PGBK [11075]: 10 meq | INTRAVENOUS | @ 11:00:00 | Stop: 2022-02-21 | NDC 00338070541

## 2022-02-21 MED ADMIN — POTASSIUM CHLORIDE IN WATER 10 MEQ/50 ML IV PGBK [11075]: 10 meq | INTRAVENOUS | @ 08:00:00 | Stop: 2022-02-21 | NDC 00338070541

## 2022-02-21 MED ADMIN — POTASSIUM CHLORIDE 20 MEQ PO TBTQ [35943]: 60 meq | ORAL | @ 20:00:00 | Stop: 2022-02-21 | NDC 00832532511

## 2022-02-21 MED ADMIN — FUROSEMIDE 10 MG/ML IJ SOLN [3291]: 80 mg | INTRAVENOUS | @ 20:00:00 | Stop: 2022-02-21 | NDC 55150032301

## 2022-02-21 MED ADMIN — POTASSIUM CHLORIDE 20 MEQ PO TBTQ [35943]: 40 meq | ORAL | @ 17:00:00 | Stop: 2022-02-21 | NDC 00832532511

## 2022-02-21 MED ADMIN — SODIUM CHLORIDE 0.9 % IV SOLP [27838]: 250 mL | INTRAVENOUS | @ 08:00:00 | Stop: 2022-02-21 | NDC 00338004902

## 2022-02-21 MED ADMIN — FUROSEMIDE 10 MG/ML IJ SOLN [3291]: 20 mg/h | INTRAVENOUS | @ 20:00:00 | NDC 00409610220

## 2022-02-21 MED ADMIN — FUROSEMIDE 10 MG/ML IJ SOLN [3291]: 60 mg | INTRAVENOUS | @ 14:00:00 | Stop: 2022-02-21 | NDC 63323028001

## 2022-02-21 MED ADMIN — POTASSIUM CHLORIDE IN WATER 10 MEQ/50 ML IV PGBK [11075]: 10 meq | INTRAVENOUS | @ 17:00:00 | Stop: 2022-02-21 | NDC 00338070541

## 2022-02-21 MED ADMIN — DEXTROSE 5% IN WATER IV SOLP [2364]: 20 mg/h | INTRAVENOUS | @ 20:00:00 | NDC 00338001738

## 2022-02-21 MED ADMIN — FUROSEMIDE 10 MG/ML IJ SOLN [3291]: 60 mg | INTRAVENOUS | @ 08:00:00 | Stop: 2022-02-21 | NDC 63323028001

## 2022-02-22 ENCOUNTER — Inpatient Hospital Stay
Admit: 2022-02-22 | Discharge: 2022-02-22 | Payer: MEDICARE | Primary: Student in an Organized Health Care Education/Training Program

## 2022-02-22 MED ADMIN — FUROSEMIDE 10 MG/ML IJ SOLN [3291]: 20 mg/h | INTRAVENOUS | @ 07:00:00 | NDC 00409610220

## 2022-02-22 MED ADMIN — POTASSIUM CHLORIDE 20 MEQ PO TBTQ [35943]: 40 meq | ORAL | @ 14:00:00 | Stop: 2022-02-22 | NDC 00832532511

## 2022-02-22 MED ADMIN — POTASSIUM CHLORIDE 20 MEQ PO TBTQ [35943]: 60 meq | ORAL | @ 01:00:00 | Stop: 2022-02-22 | NDC 00832532511

## 2022-02-22 MED ADMIN — DEXTROSE 5% IN WATER IV SOLP [2364]: 20 mg/h | INTRAVENOUS | @ 14:00:00 | NDC 00338001738

## 2022-02-22 MED ADMIN — DEXTROSE 5% IN WATER IV SOLP [2364]: 20 mg/h | INTRAVENOUS | @ 23:00:00 | NDC 00338001738

## 2022-02-22 MED ADMIN — DEXTROSE 5% IN WATER IV SOLP [2364]: 20 mg/h | INTRAVENOUS | @ 07:00:00 | NDC 00338001738

## 2022-02-22 MED ADMIN — POTASSIUM CHLORIDE 20 MEQ PO TBTQ [35943]: 40 meq | ORAL | Stop: 2022-02-22 | NDC 00832532511

## 2022-02-22 MED ADMIN — IRON SUCROSE 100 MG IRON/5 ML IV SOLN GROUP [280019]: 300 mg | INTRAVENOUS | @ 14:00:00 | Stop: 2022-02-25 | NDC 00517234001

## 2022-02-22 MED ADMIN — FUROSEMIDE 10 MG/ML IJ SOLN [3291]: 20 mg/h | INTRAVENOUS | @ 23:00:00 | NDC 00409610220

## 2022-02-22 MED ADMIN — POTASSIUM CHLORIDE 20 MEQ PO TBTQ [35943]: 40 meq | ORAL | @ 17:00:00 | Stop: 2022-02-22 | NDC 00832532511

## 2022-02-22 MED ADMIN — FUROSEMIDE 10 MG/ML IJ SOLN [3291]: 20 mg/h | INTRAVENOUS | @ 14:00:00 | NDC 00409610220

## 2022-02-22 MED ADMIN — SODIUM CHLORIDE 0.9 % IV SOLP [27838]: 300 mg | INTRAVENOUS | @ 14:00:00 | Stop: 2022-02-25 | NDC 00338004902

## 2022-02-23 MED ADMIN — DEXTROSE 5% IN WATER IV SOLP [2364]: 20 mg/h | INTRAVENOUS | @ 09:00:00 | NDC 00338001738

## 2022-02-23 MED ADMIN — FUROSEMIDE 10 MG/ML IJ SOLN [3291]: 20 mg/h | INTRAVENOUS | @ 09:00:00 | NDC 00409610220

## 2022-02-23 MED ADMIN — POTASSIUM CHLORIDE 20 MEQ PO TBTQ [35943]: 40 meq | ORAL | @ 12:00:00 | Stop: 2022-02-23 | NDC 00832532511

## 2022-02-23 MED ADMIN — IRON SUCROSE 100 MG IRON/5 ML IV SOLN GROUP [280019]: 300 mg | INTRAVENOUS | @ 14:00:00 | Stop: 2022-02-25 | NDC 00517234001

## 2022-02-23 MED ADMIN — POTASSIUM CHLORIDE 20 MEQ PO TBTQ [35943]: 60 meq | ORAL | Stop: 2022-02-23 | NDC 00832532511

## 2022-02-23 MED ADMIN — SODIUM CHLORIDE 0.9 % IV SOLP [27838]: 300 mg | INTRAVENOUS | @ 14:00:00 | Stop: 2022-02-25 | NDC 00338004902

## 2022-02-24 MED ADMIN — IRON SUCROSE 100 MG IRON/5 ML IV SOLN GROUP [280019]: 300 mg | INTRAVENOUS | @ 14:00:00 | Stop: 2022-02-24 | NDC 00517234001

## 2022-02-24 MED ADMIN — POTASSIUM CHLORIDE 20 MEQ PO TBTQ [35943]: 60 meq | ORAL | @ 15:00:00 | Stop: 2022-02-24 | NDC 00832532511

## 2022-02-24 MED ADMIN — DEXTROSE 5% IN WATER IV SOLP [2364]: 20 mg/h | INTRAVENOUS | @ 20:00:00 | NDC 00338001738

## 2022-02-24 MED ADMIN — FUROSEMIDE 10 MG/ML IJ SOLN [3291]: 20 mg/h | INTRAVENOUS | @ 10:00:00 | NDC 00409610220

## 2022-02-24 MED ADMIN — METOLAZONE 5 MG PO TAB [10588]: 5 mg | ORAL | @ 16:00:00 | Stop: 2022-02-24 | NDC 51079002401

## 2022-02-24 MED ADMIN — FUROSEMIDE 10 MG/ML IJ SOLN [3291]: 20 mg/h | INTRAVENOUS | NDC 00409610220

## 2022-02-24 MED ADMIN — DEXTROSE 5% IN WATER IV SOLP [2364]: 20 mg/h | INTRAVENOUS | @ 10:00:00 | NDC 00338001738

## 2022-02-24 MED ADMIN — METOLAZONE 2.5 MG PO TAB [10587]: 2.5 mg | ORAL | @ 15:00:00 | Stop: 2022-02-24 | NDC 00185505001

## 2022-02-24 MED ADMIN — SODIUM CHLORIDE 0.9 % IV SOLP [27838]: 300 mg | INTRAVENOUS | @ 14:00:00 | Stop: 2022-02-24 | NDC 00338004902

## 2022-02-24 MED ADMIN — DEXTROSE 5% IN WATER IV SOLP [2364]: 20 mg/h | INTRAVENOUS | NDC 00338001738

## 2022-02-24 MED ADMIN — FUROSEMIDE 10 MG/ML IJ SOLN [3291]: 20 mg/h | INTRAVENOUS | @ 20:00:00 | NDC 00409610220

## 2022-02-24 MED ADMIN — WATER FOR INJECTION, STERILE IJ SOLN [79513]: 5 mL | INTRAVENOUS | @ 13:00:00 | Stop: 2022-02-24 | NDC 00409488723

## 2022-02-24 MED ADMIN — ACETAZOLAMIDE SODIUM 500 MG IJ SOLR [114]: 500 mg | INTRAVENOUS | @ 13:00:00 | Stop: 2022-02-24 | NDC 23155031331

## 2022-02-25 MED ADMIN — AMITRIPTYLINE(#)/GABAPENTIN/BACLOFEN 2/6/2% TOPICAL CRM [214099]: TOPICAL | @ 01:00:00 | NDC 54029466709

## 2022-02-25 MED ADMIN — METOLAZONE 5 MG PO TAB [10588]: 5 mg | ORAL | @ 16:00:00 | Stop: 2022-02-25 | NDC 51079002401

## 2022-02-25 MED ADMIN — DEXTROSE 5% IN WATER IV SOLP [2364]: 20 mg/h | INTRAVENOUS | @ 16:00:00 | NDC 00338001738

## 2022-02-25 MED ADMIN — POTASSIUM CHLORIDE 20 MEQ PO TBTQ [35943]: 60 meq | ORAL | @ 13:00:00 | Stop: 2022-02-25 | NDC 00832532511

## 2022-02-25 MED ADMIN — DEXTROSE 5% IN WATER IV SOLP [2364]: 20 mg/h | INTRAVENOUS | @ 08:00:00 | NDC 00338001738

## 2022-02-25 MED ADMIN — FUROSEMIDE 10 MG/ML IJ SOLN [3291]: 20 mg/h | INTRAVENOUS | @ 08:00:00 | NDC 00409610220

## 2022-02-25 MED ADMIN — METOLAZONE 2.5 MG PO TAB [10587]: 2.5 mg | ORAL | @ 17:00:00 | Stop: 2022-02-25 | NDC 00185505001

## 2022-02-25 MED ADMIN — POTASSIUM CHLORIDE 20 MEQ PO TBTQ [35943]: 60 meq | ORAL | @ 23:00:00 | Stop: 2022-02-25 | NDC 00832532511

## 2022-02-25 MED ADMIN — FUROSEMIDE 10 MG/ML IJ SOLN [3291]: 20 mg/h | INTRAVENOUS | @ 16:00:00 | NDC 00409610220

## 2022-02-25 MED ADMIN — MAGNESIUM OXIDE 400 MG (241.3 MG MAGNESIUM) PO TAB [10491]: 200 mg | ORAL | @ 16:00:00 | NDC 64980033912

## 2022-02-25 MED ADMIN — ACETAZOLAMIDE SODIUM 500 MG IJ SOLR [114]: 500 mg | INTRAVENOUS | @ 15:00:00 | Stop: 2022-02-25 | NDC 23155031331

## 2022-02-26 ENCOUNTER — Encounter
Admit: 2022-02-26 | Discharge: 2022-02-26 | Payer: MEDICARE | Primary: Student in an Organized Health Care Education/Training Program

## 2022-02-26 MED ADMIN — POTASSIUM CHLORIDE 20 MEQ PO TBTQ [35943]: 40 meq | ORAL | @ 14:00:00 | Stop: 2022-02-26 | NDC 00832532511

## 2022-02-26 MED ADMIN — MAGNESIUM OXIDE 400 MG (241.3 MG MAGNESIUM) PO TAB [10491]: 200 mg | ORAL | @ 02:00:00 | NDC 64980033912

## 2022-02-26 MED ADMIN — WATER FOR INJECTION, STERILE IJ SOLN [79513]: 10 mL | INTRAVENOUS | @ 16:00:00 | Stop: 2022-02-26 | NDC 00409488717

## 2022-02-26 MED ADMIN — FUROSEMIDE 10 MG/ML IJ SOLN [3291]: 10 mg/h | INTRAVENOUS | @ 16:00:00 | NDC 00409610220

## 2022-02-26 MED ADMIN — DEXTROSE 5% IN WATER IV SOLP [2364]: 10 mg/h | INTRAVENOUS | @ 16:00:00 | NDC 00338001738

## 2022-02-26 MED ADMIN — MAGNESIUM OXIDE 400 MG (241.3 MG MAGNESIUM) PO TAB [10491]: 200 mg | ORAL | @ 13:00:00 | NDC 64980033912

## 2022-02-26 MED ADMIN — ACETAZOLAMIDE SODIUM 500 MG IJ SOLR [114]: 500 mg | INTRAVENOUS | @ 16:00:00 | Stop: 2022-02-26 | NDC 23155031331

## 2022-02-27 ENCOUNTER — Encounter
Admit: 2022-02-27 | Discharge: 2022-02-27 | Payer: MEDICARE | Primary: Student in an Organized Health Care Education/Training Program

## 2022-02-27 MED ADMIN — MAGNESIUM OXIDE 400 MG (241.3 MG MAGNESIUM) PO TAB [10491]: 200 mg | ORAL | @ 14:00:00 | NDC 64980033912

## 2022-02-27 MED ADMIN — POTASSIUM CHLORIDE 20 MEQ PO TBTQ [35943]: 40 meq | ORAL | @ 01:00:00 | Stop: 2022-02-27 | NDC 00832532511

## 2022-02-27 MED ADMIN — SODIUM CHLORIDE 0.9 % IV SOLP [27838]: 1000 mL | INTRAVENOUS | @ 22:00:00 | Stop: 2022-02-27 | NDC 00338004904

## 2022-02-27 MED ADMIN — MAGNESIUM OXIDE 400 MG (241.3 MG MAGNESIUM) PO TAB [10491]: 200 mg | ORAL | @ 01:00:00 | NDC 64980033912

## 2022-02-27 MED ADMIN — SODIUM CHLORIDE 0.9 % IV SOLP [27838]: 500 mL | INTRAVENOUS | @ 15:00:00 | Stop: 2022-02-27 | NDC 00338004904

## 2022-02-28 MED ADMIN — MAGNESIUM OXIDE 400 MG (241.3 MG MAGNESIUM) PO TAB [10491]: 200 mg | ORAL | @ 01:00:00 | NDC 64980033912

## 2022-02-28 MED ADMIN — ALLOPURINOL 100 MG PO TAB [310]: 50 mg | ORAL | @ 14:00:00 | NDC 00904704161

## 2022-03-01 MED ADMIN — MAGNESIUM OXIDE 400 MG (241.3 MG MAGNESIUM) PO TAB [10491]: 200 mg | ORAL | @ 15:00:00 | NDC 64980033912

## 2022-03-01 MED ADMIN — MAGNESIUM OXIDE 400 MG (241.3 MG MAGNESIUM) PO TAB [10491]: 200 mg | ORAL | @ 03:00:00 | NDC 64980033912

## 2022-03-01 MED ADMIN — POTASSIUM CHLORIDE 20 MEQ PO TBTQ [35943]: 60 meq | ORAL | @ 14:00:00 | Stop: 2022-03-01 | NDC 00832532511

## 2022-03-01 MED ADMIN — TORSEMIDE 20 MG PO TAB [18293]: 40 mg | ORAL | @ 14:00:00 | NDC 68084053911

## 2022-03-02 MED ADMIN — TORSEMIDE 20 MG PO TAB [18293]: 40 mg | ORAL | @ 13:00:00 | NDC 68084053911

## 2022-03-02 MED ADMIN — POTASSIUM CHLORIDE 20 MEQ PO TBTQ [35943]: 80 meq | ORAL | @ 12:00:00 | Stop: 2022-03-02 | NDC 00832532511

## 2022-03-02 MED ADMIN — ALLOPURINOL 100 MG PO TAB [310]: 50 mg | ORAL | @ 13:00:00 | NDC 00904704161

## 2022-03-02 MED ADMIN — POTASSIUM CHLORIDE 20 MEQ PO TBTQ [35943]: 40 meq | ORAL | @ 22:00:00 | Stop: 2022-03-02 | NDC 00832532511

## 2022-03-03 ENCOUNTER — Encounter
Admit: 2022-03-03 | Discharge: 2022-03-03 | Payer: MEDICARE | Primary: Student in an Organized Health Care Education/Training Program

## 2022-03-03 MED ADMIN — TORSEMIDE 20 MG PO TAB [18293]: 40 mg | ORAL | @ 14:00:00 | Stop: 2022-03-03 | NDC 68084053911

## 2022-03-03 MED ADMIN — POTASSIUM CHLORIDE 20 MEQ/15 ML PO LIQD [6432]: 40 meq | ORAL | @ 14:00:00 | Stop: 2022-03-03 | NDC 66689004701

## 2022-03-04 ENCOUNTER — Encounter
Admit: 2022-03-04 | Discharge: 2022-03-04 | Payer: MEDICARE | Primary: Student in an Organized Health Care Education/Training Program

## 2022-03-04 NOTE — Telephone Encounter
Patient Discharge Date from hospital: 03/03/22  Date Call Attempted: 03/04/22  Number of Attempts: 1  Date Call Completed:  03/04/22    Call placed to pt for 72 hour post hospital follow up call. No answer, VM full. Call placed to daughter, Rosalita Chessman. She confirmed pt is still residing at [facility]. Call placed to [place] and spoke to [facility]. She confirmed they will not be able to set up transportation for an out of town appt. Call returned to daughter, Rosalita Chessman and informed her they do not provide transportation. She will arrange to bring pt to appointment.      Two Patient Identifier complete: Yes []     Next Appointment    Next follow-up appointment on  03/06/2022 at 1300 with Satira Anis, APRN    Transportation    Does pt have transportation?  Yes []     No [x]    NA []      Home Health    [facility] Ph) [phone]  Fax) [phone]  [facility] Ph) [phone]    Medications    Does pt have all medications? Yes  []     No [x]     Nurse, Rodney Booze informed she called earlier and spoke to the nurse for the discharging physician to request new dosing for Allopurinol to be sent to his [facility]. She said the nurse she spoke to will follow up.    CHANGE how you take:  allopurinoL (ZYLOPRIM)  metOLazone (ZAROXOLYN)  torsemide (DEMADEX)  STOP taking:  MILK OF MAGNESIA PO    Diet    200 mg cholesterol, 2 G Na, 2 L fluid restriction.     Is patient following prescribed diet and restrictions?  Yes []    No [x]   Nurse states they do not offer special diets. It is up to pt to choose his foods.     Scale/Weight    Does pt have a scale at home?  Yes [x]    No []     Did pt weight first thing this morning?  Yes [x]    No []      If yes, what was pt's first morning weight today?  278.6    Signs and Symptoms    Nurse reports the following symptoms:     Pt reports his BLE edema is better since admission. No SOA reported by pt.     Nurse verbalized understanding of signs and symptoms of HF and when to contact a provider or seek immediate assistance at the ER.    Was pt given zone sheet? Yes [x]   No []     Intervention(s)    Nurse educated on the importance of weighing daily first thing in the morning before dressing, before eating or drinking, and after voiding using the same scale in the same location and write results down in note pad or log. Notify us for weight gains of 3 lbs in one day or 5 lbs in one week. Notify your provider for increased SOA.  Notify your provider for swelling or increased swelling in BLE or abdominal fullness/bloating. Advised to check B/P at least once daily. Check 1-2 hours after am meds. Log results. Check B/P other times if feeling lightheaded, dizzy, or if you feel your heart rate is elevated. Document the time you checked and any symptoms you may be feeling at the time. Call 911 for sudden, severe chest/pain pressure/SOA develops. Be sure to keep your follow up appointment and bring your weight logs, B/P logs, and medication list with you  to your appointment. Call us at [phone] if you have any questions.       Plan of Care    Continued education needed for heart failure symptom management and when to contact our office.

## 2022-03-06 ENCOUNTER — Encounter
Admit: 2022-03-06 | Discharge: 2022-03-06 | Payer: MEDICARE | Primary: Student in an Organized Health Care Education/Training Program

## 2022-03-06 ENCOUNTER — Ambulatory Visit
Admit: 2022-03-06 | Discharge: 2022-03-06 | Payer: MEDICARE | Primary: Student in an Organized Health Care Education/Training Program

## 2022-03-06 DIAGNOSIS — M179 Osteoarthritis of knee, unspecified: Secondary | ICD-10-CM

## 2022-03-06 DIAGNOSIS — I38 Endocarditis, valve unspecified: Secondary | ICD-10-CM

## 2022-03-06 DIAGNOSIS — N401 Enlarged prostate with lower urinary tract symptoms: Secondary | ICD-10-CM

## 2022-03-06 DIAGNOSIS — I350 Nonrheumatic aortic (valve) stenosis: Secondary | ICD-10-CM

## 2022-03-06 DIAGNOSIS — I5043 Acute on chronic combined systolic (congestive) and diastolic (congestive) heart failure: Secondary | ICD-10-CM

## 2022-03-06 DIAGNOSIS — E669 Obesity, unspecified: Secondary | ICD-10-CM

## 2022-03-06 DIAGNOSIS — E785 Hyperlipidemia, unspecified: Secondary | ICD-10-CM

## 2022-03-06 DIAGNOSIS — Z952 Presence of prosthetic heart valve: Secondary | ICD-10-CM

## 2022-03-06 DIAGNOSIS — I2510 Atherosclerotic heart disease of native coronary artery without angina pectoris: Secondary | ICD-10-CM

## 2022-03-06 DIAGNOSIS — Z720 Tobacco use: Secondary | ICD-10-CM

## 2022-03-06 DIAGNOSIS — N183 Stage 3 chronic kidney disease, unspecified whether stage 3a or 3b CKD (HCC): Secondary | ICD-10-CM

## 2022-03-06 DIAGNOSIS — I10 Essential (primary) hypertension: Secondary | ICD-10-CM

## 2022-03-06 DIAGNOSIS — E781 Pure hyperglyceridemia: Secondary | ICD-10-CM

## 2022-03-06 DIAGNOSIS — R011 Cardiac murmur, unspecified: Secondary | ICD-10-CM

## 2022-03-06 DIAGNOSIS — I4891 Unspecified atrial fibrillation: Secondary | ICD-10-CM

## 2022-03-06 DIAGNOSIS — M109 Gout, unspecified: Secondary | ICD-10-CM

## 2022-03-06 LAB — BASIC METABOLIC PANEL
ANION GAP: 15 — ABNORMAL HIGH (ref 3–12)
BLD UREA NITROGEN: 90 mg/dL — ABNORMAL HIGH (ref 7–25)
CALCIUM: 9.2 mg/dL (ref 8.5–10.6)
CHLORIDE: 97 MMOL/L — ABNORMAL LOW (ref 98–110)
CREATININE: 2 mg/dL — ABNORMAL HIGH (ref 0.4–1.24)
EGFR: 33 mL/min — ABNORMAL LOW (ref >60)
GLUCOSE,PANEL: 106 mg/dL — ABNORMAL HIGH (ref 70–100)
POTASSIUM: 3.8 MMOL/L (ref 3.5–5.1)
SODIUM: 139 MMOL/L (ref 137–147)

## 2022-03-06 LAB — MAGNESIUM: MAGNESIUM: 1.9 mg/dL (ref 1.6–2.6)

## 2022-03-06 NOTE — Patient Instructions
Your fluid levels today are starting to elevated. Will plan to check labs today and update your facility on instructions on your diuretics.  Keep working on the low sodium diet and drinking around 48-64 ounces of fluid per day. Keep weighing yourself daily and keeping track of your numbers. Please let our clinic know if you have any problems before your next appointment.      Your instructions today include:    1.  Medications: No changes today; will contact your facility when your labs are reviewed.     2.  Labs: will check BMP and Magnesium today and follow up on your diuretic doses     3.  Next follow up appointment in ~2 weeks with HF APP    4.  Call for any worsening symptoms of shortness of breath, swelling, sudden weight gain, lightheadedness, heart racing, chest pain.     5. Remember to weigh your self daily and call for weight increase greater than 3 pounds overnight or 5 pounds in one week.       Thank you for coming to [facility].     Satira Anis APRN  Nurse Practitioner  [phone]

## 2022-03-07 ENCOUNTER — Encounter
Admit: 2022-03-07 | Discharge: 2022-03-07 | Payer: MEDICARE | Primary: Student in an Organized Health Care Education/Training Program

## 2022-03-07 DIAGNOSIS — I340 Nonrheumatic mitral (valve) insufficiency: Secondary | ICD-10-CM

## 2022-03-07 DIAGNOSIS — I351 Nonrheumatic aortic (valve) insufficiency: Secondary | ICD-10-CM

## 2022-03-07 MED ORDER — POTASSIUM CHLORIDE 20 MEQ PO TBTQ
60 meq | ORAL_TABLET | Freq: Every day | ORAL | 3 refills | 30.00000 days | Status: AC
Start: 2022-03-07 — End: ?

## 2022-03-07 MED ORDER — TORSEMIDE 20 MG PO TAB
60 mg | ORAL_TABLET | Freq: Every day | ORAL | 3 refills | 67.50000 days | Status: AC
Start: 2022-03-07 — End: ?

## 2022-03-07 MED ORDER — METOLAZONE 5 MG PO TAB
5 mg | ORAL_TABLET | ORAL | 1 refills | 84.00000 days | Status: AC
Start: 2022-03-07 — End: ?

## 2022-03-07 NOTE — Telephone Encounter
Attempted to contact pt to discuss plan of care. Unable to LVM.

## 2022-03-07 NOTE — Telephone Encounter
-----   Message from Sherle Poe, APRN-NP sent at 03/07/2022 11:45 AM CDT -----  Please can we call his facility today and ask the nurse to give him Metolazone 5 mg po x1 dose today w/ extra Kdur 40 mEq    Tomorrow they need to increase his torsemide to 60 mg daily (up from from) along with total of 60 mEq of Kdur (up from 40 mEq).    Monday and Fridays they need to give him Metolazone 5 mg (instead of 2.5mg ) and extra 40 mEq of Kdur.     Please ask them to check BMP with Mg level in about 1 week to f/u.     Thank you  JC

## 2022-03-07 NOTE — Telephone Encounter
Called and spoke to [place], Charity fundraiser at [facility] regarding updated plan of care. Tasha requests orders and labs be fax to them.

## 2022-03-12 ENCOUNTER — Encounter
Admit: 2022-03-12 | Discharge: 2022-03-12 | Payer: MEDICARE | Primary: Student in an Organized Health Care Education/Training Program

## 2022-03-12 NOTE — Telephone Encounter
spoke with Tosha at pts facility at [phone] who reports pt has had itching all over and some burning in his chest. Pt not having any SOA or other symptoms at this time. Pt concerned this is a reaction to potassium that was increased by APRN Satira Anis.       Current Outpatient Medications:   ?  acetaminophen (TYLENOL EXTRA STRENGTH) 500 mg tablet, Take one tablet by mouth every 4 hours as needed for Pain. Max of 4,000 mg of acetaminophen in 24 hours., Disp: , Rfl:   ?  allopurinoL (ZYLOPRIM) 100 mg tablet, Take one-half tablet by mouth daily. Indications: treatment to prevent acute gout attack, Disp: 45 tablet, Rfl: 0  ?  apixaban (ELIQUIS) 5 mg tablet, Take one tablet by mouth twice daily., Disp: , Rfl:   ?  aspirin 81 mg chewable tablet, Chew one tablet by mouth daily. Take with food., Disp: 90 tablet, Rfl: 0  ?  atorvastatin (LIPITOR) 10 mg tablet, Take one tablet by mouth daily., Disp: 90 tablet, Rfl: 3  ?  clotrimazole-betamethasone (LOTRISONE) 1-0.05 % topical cream, Apply  topically to affected area twice daily., Disp: , Rfl:   ?  divalproex (DEPAKOTE EC) 250 mg DR tablet, Take three tablets by mouth at bedtime daily. Take with food., Disp: , Rfl:   ?  docusate (COLACE) 100 mg capsule, Take one capsule by mouth twice daily as needed., Disp: , Rfl:   ?  gabapentin (NEURONTIN) 100 mg capsule, Take one capsule by mouth three times daily., Disp: , Rfl:   ?  levothyroxine (SYNTHROID) 75 mcg tablet, Take one tablet by mouth daily 30 minutes before breakfast., Disp: , Rfl:   ?  metOLazone (ZAROXOLYN) 5 mg tablet, Take one tablet by mouth twice weekly. Take on Monday's and Fridays., Disp: 90 tablet, Rfl: 1  ?  metoprolol succinate XL (TOPROL XL) 100 mg extended release tablet, Take one tablet by mouth daily., Disp: 90 tablet, Rfl: 3  ?  potassium chloride SR (KLOR-CON M20) 20 mEq tablet, Take three tablets by mouth daily. Take with a meal and a full glass of water. Take extra 40 mEq on Monday's and Friday's with metolazone., Disp: 270 tablet, Rfl: 3  ?  sertraline (ZOLOFT) 50 mg tablet, Take one tablet by mouth daily., Disp: , Rfl:   ?  spironolactone (ALDACTONE) 25 mg tablet, Take one-half tablet by mouth daily. Take with food., Disp: 90 tablet, Rfl: 0  ?  tamsulosin (FLOMAX) 0.4 mg capsule, Take one capsule by mouth daily. Do not crush, chew or open capsules. Take 30 minutes following the same meal each day., Disp: , Rfl:   ?  torsemide (DEMADEX) 20 mg tablet, Take three tablets by mouth daily., Disp: 60 tablet, Rfl: 3

## 2022-03-12 NOTE — Telephone Encounter
Call placed to Tosha at pt's facility, LM with med aid to inform that potassium is unlikely to be the source of itching and other causes should be investigated.  Mickie Bail will call back for any questions.

## 2022-03-13 ENCOUNTER — Encounter
Admit: 2022-03-13 | Discharge: 2022-03-13 | Payer: MEDICARE | Primary: Student in an Organized Health Care Education/Training Program

## 2022-03-18 ENCOUNTER — Encounter
Admit: 2022-03-18 | Discharge: 2022-03-18 | Payer: MEDICARE | Primary: Student in an Organized Health Care Education/Training Program

## 2022-03-18 NOTE — Telephone Encounter
Spoke with pt, he reports more ankle swelling     Patient Status  patient is an established patient with [facility].        Signs and Symptoms  right ankle swelling  No SOA        Medication Review    Current Outpatient Medications:   ?  acetaminophen (TYLENOL EXTRA STRENGTH) 500 mg tablet, Take one tablet by mouth every 4 hours as needed for Pain. Max of 4,000 mg of acetaminophen in 24 hours., Disp: , Rfl:   ?  allopurinoL (ZYLOPRIM) 100 mg tablet, Take one-half tablet by mouth daily. Indications: treatment to prevent acute gout attack, Disp: 45 tablet, Rfl: 0  ?  apixaban (ELIQUIS) 5 mg tablet, Take one tablet by mouth twice daily., Disp: , Rfl:   ?  aspirin 81 mg chewable tablet, Chew one tablet by mouth daily. Take with food., Disp: 90 tablet, Rfl: 0  ?  atorvastatin (LIPITOR) 10 mg tablet, Take one tablet by mouth daily., Disp: 90 tablet, Rfl: 3  ?  clotrimazole-betamethasone (LOTRISONE) 1-0.05 % topical cream, Apply  topically to affected area twice daily., Disp: , Rfl:   ?  divalproex (DEPAKOTE EC) 250 mg DR tablet, Take three tablets by mouth at bedtime daily. Take with food., Disp: , Rfl:   ?  docusate (COLACE) 100 mg capsule, Take one capsule by mouth twice daily as needed., Disp: , Rfl:   ?  gabapentin (NEURONTIN) 100 mg capsule, Take one capsule by mouth three times daily., Disp: , Rfl:   ?  levothyroxine (SYNTHROID) 75 mcg tablet, Take one tablet by mouth daily 30 minutes before breakfast., Disp: , Rfl:   ?  metOLazone (ZAROXOLYN) 5 mg tablet, Take one tablet by mouth twice weekly. Take on Monday's and Fridays., Disp: 90 tablet, Rfl: 1  ?  metoprolol succinate XL (TOPROL XL) 100 mg extended release tablet, Take one tablet by mouth daily., Disp: 90 tablet, Rfl: 3  ?  potassium chloride SR (KLOR-CON M20) 20 mEq tablet, Take three tablets by mouth daily. Take with a meal and a full glass of water. Take extra 40 mEq on Monday's and Friday's with metolazone., Disp: 270 tablet, Rfl: 3  ? sertraline (ZOLOFT) 50 mg tablet, Take one tablet by mouth daily., Disp: , Rfl:   ?  spironolactone (ALDACTONE) 25 mg tablet, Take one-half tablet by mouth daily. Take with food., Disp: 90 tablet, Rfl: 0  ?  tamsulosin (FLOMAX) 0.4 mg capsule, Take one capsule by mouth daily. Do not crush, chew or open capsules. Take 30 minutes following the same meal each day., Disp: , Rfl:   ?  torsemide (DEMADEX) 20 mg tablet, Take three tablets by mouth daily., Disp: 60 tablet, Rfl: 3          Fluid/sodium Intake  Patient is drinking under  64 oz of fluid daily     Weight   2/27283 lbs   2/26 285 lbs

## 2022-03-18 NOTE — Telephone Encounter
Called and spoke to patient, using 2 patient identifiers. Pt reports increase BLE swelling over course of two days with burning sensations. Some claudication pain but not terrible. Also reports skin is becoming tight and some blisters have formed. Last telephone call pt had reported stopping potassium d/t rash. Reports today that he has restarted. Reports taking 60 mEq in am and 40 mEq in afternoon daily. Re-educated that patient should only be taking the extra 40 mEq on MF(metolazone days). Weight today 285 lbs, weight yesterday 283 lbs.

## 2022-03-18 NOTE — Telephone Encounter
Spoke with pt to verify previous instructions given by KP. Pt able to repeat instructions to take potassium and metolazone correctly and that he has a follow up appointment on July 14th. Pt agreeable to POC.

## 2022-03-18 NOTE — Telephone Encounter
Second message received from pt for Dr. Sandria Manly stating he saw Jacob Anis, APRN 03/06/22 . She wanted to see pt again in two weeks. But they could not get him in until 7\27/23 with Fara Boros, APRN. Pt reporting increased BLE edema. States his ankles have been really burning and has more lesions. He is requesting cb on primary number.

## 2022-04-04 ENCOUNTER — Ambulatory Visit
Admit: 2022-04-04 | Discharge: 2022-04-04 | Payer: MEDICARE | Primary: Student in an Organized Health Care Education/Training Program

## 2022-04-04 ENCOUNTER — Encounter
Admit: 2022-04-04 | Discharge: 2022-04-04 | Payer: MEDICARE | Primary: Student in an Organized Health Care Education/Training Program

## 2022-04-04 DIAGNOSIS — E785 Hyperlipidemia, unspecified: Secondary | ICD-10-CM

## 2022-04-04 DIAGNOSIS — Z952 Presence of prosthetic heart valve: Secondary | ICD-10-CM

## 2022-04-04 DIAGNOSIS — I5043 Acute on chronic combined systolic (congestive) and diastolic (congestive) heart failure: Secondary | ICD-10-CM

## 2022-04-04 DIAGNOSIS — I10 Essential (primary) hypertension: Secondary | ICD-10-CM

## 2022-04-04 DIAGNOSIS — I350 Nonrheumatic aortic (valve) stenosis: Secondary | ICD-10-CM

## 2022-04-04 DIAGNOSIS — Z720 Tobacco use: Secondary | ICD-10-CM

## 2022-04-04 DIAGNOSIS — N401 Enlarged prostate with lower urinary tract symptoms: Secondary | ICD-10-CM

## 2022-04-04 DIAGNOSIS — I4891 Unspecified atrial fibrillation: Secondary | ICD-10-CM

## 2022-04-04 DIAGNOSIS — M179 Osteoarthritis of knee, unspecified: Secondary | ICD-10-CM

## 2022-04-04 DIAGNOSIS — R011 Cardiac murmur, unspecified: Secondary | ICD-10-CM

## 2022-04-04 DIAGNOSIS — M109 Gout, unspecified: Secondary | ICD-10-CM

## 2022-04-04 DIAGNOSIS — I340 Nonrheumatic mitral (valve) insufficiency: Secondary | ICD-10-CM

## 2022-04-04 DIAGNOSIS — I38 Endocarditis, valve unspecified: Secondary | ICD-10-CM

## 2022-04-04 DIAGNOSIS — G5603 Carpal tunnel syndrome, bilateral upper limbs: Secondary | ICD-10-CM

## 2022-04-04 DIAGNOSIS — E781 Pure hyperglyceridemia: Secondary | ICD-10-CM

## 2022-04-04 DIAGNOSIS — E669 Obesity, unspecified: Secondary | ICD-10-CM

## 2022-04-04 DIAGNOSIS — N183 Stage 3 chronic kidney disease, unspecified whether stage 3a or 3b CKD (HCC): Secondary | ICD-10-CM

## 2022-04-04 LAB — BASIC METABOLIC PANEL
ANION GAP: 16 K/UL — ABNORMAL HIGH (ref 3–12)
BLD UREA NITROGEN: 57 mg/dL — ABNORMAL HIGH (ref 7–25)
CALCIUM: 9.2 mg/dL (ref 8.5–10.6)
CO2: 26 MMOL/L — ABNORMAL HIGH (ref 21–30)
CREATININE: 1.8 mg/dL — ABNORMAL HIGH (ref 0.4–1.24)
EGFR: 37 mL/min — ABNORMAL LOW (ref >60)
GLUCOSE,PANEL: 89 mg/dL (ref 70–100)
POTASSIUM: 4.1 MMOL/L (ref 3.5–5.1)
SODIUM: 137 MMOL/L (ref 137–147)

## 2022-04-04 LAB — BNP (B-TYPE NATRIURETIC PEPTI): BNP: 132 pg/mL — ABNORMAL HIGH (ref 0–100)

## 2022-04-04 LAB — CBC
HEMATOCRIT: 47 % (ref 40–50)
RBC COUNT: 4.9 M/UL (ref 4.4–5.5)
WBC COUNT: 9.8 K/UL (ref 4.5–11.0)

## 2022-04-04 LAB — MAGNESIUM: MAGNESIUM: 1.9 mg/dL — ABNORMAL LOW (ref 1.6–2.6)

## 2022-04-04 MED ORDER — OZEMPIC 0.25 MG OR 0.5 MG (2 MG/3 ML) SC PNIJ
.25 mg | SUBCUTANEOUS | 3 refills | Status: AC
Start: 2022-04-04 — End: ?

## 2022-04-07 ENCOUNTER — Encounter
Admit: 2022-04-07 | Discharge: 2022-04-07 | Payer: MEDICARE | Primary: Student in an Organized Health Care Education/Training Program

## 2022-04-07 DIAGNOSIS — I10 Essential (primary) hypertension: Secondary | ICD-10-CM

## 2022-04-07 DIAGNOSIS — N183 Stage 3 chronic kidney disease, unspecified whether stage 3a or 3b CKD (HCC): Secondary | ICD-10-CM

## 2022-04-07 DIAGNOSIS — I5043 Acute on chronic combined systolic (congestive) and diastolic (congestive) heart failure: Secondary | ICD-10-CM

## 2022-04-07 DIAGNOSIS — Z952 Presence of prosthetic heart valve: Secondary | ICD-10-CM

## 2022-04-07 MED ORDER — SPIRONOLACTONE 25 MG PO TAB
25 mg | ORAL_TABLET | Freq: Every day | ORAL | 0 refills | 90.00000 days | Status: AC
Start: 2022-04-07 — End: ?

## 2022-04-07 MED ORDER — TORSEMIDE 20 MG PO TAB
60 mg | ORAL_TABLET | Freq: Two times a day (BID) | ORAL | 3 refills | 67.50000 days | Status: AC
Start: 2022-04-07 — End: ?

## 2022-04-07 NOTE — Telephone Encounter
Attempted to call [name], voicemail box is full.      Leisure centre manager at [place],  [phone]     [phone] fax

## 2022-04-07 NOTE — Telephone Encounter
Orders faxed to [place] [facility] [place].

## 2022-04-10 ENCOUNTER — Encounter
Admit: 2022-04-10 | Discharge: 2022-04-10 | Payer: MEDICARE | Primary: Student in an Organized Health Care Education/Training Program

## 2022-04-10 NOTE — Telephone Encounter
spoke with Tosha at pts facility at [phone] who reports pt has refused to start ozempic that was prescribed on 7/14. Not able to confirm exactly why pt has refused to start medication. Pt has OV on 7/25 with Dr. Sandria Manly and can discuss concerns at that time.

## 2022-04-11 ENCOUNTER — Encounter
Admit: 2022-04-11 | Discharge: 2022-04-11 | Payer: MEDICARE | Primary: Student in an Organized Health Care Education/Training Program

## 2022-04-11 NOTE — Telephone Encounter
-----   Message from Sherle Poe, APRN-NP sent at 04/10/2022  4:43 PM CDT -----  Regarding: FW: Update on him  Hi  Please can we try to screen him for both MEMs and CCM?   Thank you!  JC  ----- Message -----  From: Altamease Oiler, MD  Sent: 04/09/2022   3:58 PM CDT  To: Sherle Poe, APRN-NP  Subject: RE: Update on him                                Jan,    Thank you for reaching out.  I definitely think CardioMEMS would be a good idea for him.  Honestly, CCM may also be a great idea.  I think we should refer for both.    Thanks for reaching out!    Feliz Beam  ----- Message -----  From: Sherle Poe, APRN-NP  Sent: 04/06/2022   1:56 PM CDT  To: Altamease Oiler, MD  Subject: Update on him                                    Hi there!    I have seen him a couple times in clinic; trying to keep his volume status stable. Wanted to ask your thoughts on a couple things.     1) Wondering what your thoughts are on CardioMEMs? I would think his facility could help out w/ him lying on the pillow but his back pain issues may not be ideal.     2) Also wondering if he would benefit from CCM? I am trying to think about pts more for CCM b/c I do feel like some pts really feel so much better symptom wise from the therapy.     He will be back to see you on 04/15/22. Please let me know what your thoughts are. Have a great day!    JC

## 2022-04-11 NOTE — Telephone Encounter
CardioMEMS Primary Screening                                  Date: 04/11/2022  Patient Referred by: Satira Anis APRN   Insurance: [employer]   Heart failure admission within last 12 months OR Elevated BNP (Elevated BNP is defined by the provider for commercial implants and does not have to be drawn within a certain timeframe) NT-pro-bnp 2133     Hospitalization Dates: 02/20/2022  Hospital Name: [facility]  IV Diuretics During Admission?: yes Yes        NYHA HF Class II or Class III  Yes  NYHA CLASS: II        Able to take dual antiplatelet or anticoagulants for 1 month post implant Yes   Additional Screening if all above are yes        Patient with active infection No        History of recurrent PE or DVT  (If yes, does patient have IVC filter?) No        Unable to tolerate RHC No        GFR < 25 mL/min and non-responsive to diuretics or on chronic renal dialysis No        Congenital heart disease or mechanical right heart valve No        Unrepaired severe valvular disease? No        CRT device implanted within last 3 months?          Date of Implant:  No        BMI > 35, axillary chest circumference > 165 cm                         cm  Yes        Patient unwilling to transmit device date or concerns with patient compliance No          Please ask patient the following:        Most recent Echo date: 01/29/22     EF:35%       Patient manages own medications?  (If no, who manages pt's medications?) No       Patient/caregiver able to make medication interventions via phone?  No     CardioMEMS Pre-Implant Patient Education                                   Education Completed by:    Video: How the CardioMEMs HF System Works    [employer]

## 2022-04-14 ENCOUNTER — Encounter
Admit: 2022-04-14 | Discharge: 2022-04-14 | Payer: MEDICARE | Primary: Student in an Organized Health Care Education/Training Program

## 2022-04-14 ENCOUNTER — Ambulatory Visit
Admit: 2022-04-14 | Discharge: 2022-04-15 | Payer: MEDICARE | Primary: Student in an Organized Health Care Education/Training Program

## 2022-04-14 DIAGNOSIS — Z720 Tobacco use: Secondary | ICD-10-CM

## 2022-04-14 DIAGNOSIS — R011 Cardiac murmur, unspecified: Secondary | ICD-10-CM

## 2022-04-14 DIAGNOSIS — N401 Enlarged prostate with lower urinary tract symptoms: Secondary | ICD-10-CM

## 2022-04-14 DIAGNOSIS — M109 Gout, unspecified: Secondary | ICD-10-CM

## 2022-04-14 DIAGNOSIS — I38 Endocarditis, valve unspecified: Secondary | ICD-10-CM

## 2022-04-14 DIAGNOSIS — I350 Nonrheumatic aortic (valve) stenosis: Secondary | ICD-10-CM

## 2022-04-14 DIAGNOSIS — E669 Obesity, unspecified: Secondary | ICD-10-CM

## 2022-04-14 DIAGNOSIS — G5621 Lesion of ulnar nerve, right upper limb: Secondary | ICD-10-CM

## 2022-04-14 DIAGNOSIS — M179 Osteoarthritis of knee, unspecified: Secondary | ICD-10-CM

## 2022-04-14 DIAGNOSIS — E781 Pure hyperglyceridemia: Secondary | ICD-10-CM

## 2022-04-14 DIAGNOSIS — E785 Hyperlipidemia, unspecified: Secondary | ICD-10-CM

## 2022-04-14 DIAGNOSIS — I4891 Unspecified atrial fibrillation: Secondary | ICD-10-CM

## 2022-04-14 NOTE — Progress Notes
The [facility] Hand Surgery      Date of Service: 04/14/2022    Subjective:            Bilateral hand numbness    History of Present Illness  This is a 76 year old man who has been reporting several months of bilateral hand numbness.  There was no injury.  He noticed symptoms after a recent open heart surgery.  Symptoms are localized to the ring and small fingers.  He has fairly constant numbness which is painful.  He reports weakness in his hands and is having trouble with fine motor movements.  He has not had any treatment for this.  He does have a history of a previous right carpal tunnel release many years ago with good results.  Nasif Len is a 76 y.o. male.     Review of Systems      Objective:         ? acetaminophen (TYLENOL EXTRA STRENGTH) 500 mg tablet Take one tablet by mouth every 4 hours as needed for Pain. Max of 4,000 mg of acetaminophen in 24 hours.   ? allopurinoL (ZYLOPRIM) 100 mg tablet Take one-half tablet by mouth daily. Indications: treatment to prevent acute gout attack   ? apixaban (ELIQUIS) 5 mg tablet Take one tablet by mouth twice daily.   ? aspirin 81 mg chewable tablet Chew one tablet by mouth daily. Take with food.   ? atorvastatin (LIPITOR) 10 mg tablet Take one tablet by mouth daily.   ? clotrimazole-betamethasone (LOTRISONE) 1-0.05 % topical cream Apply  topically to affected area twice daily.   ? divalproex (DEPAKOTE EC) 250 mg DR tablet Take three tablets by mouth at bedtime daily. Take with food.   ? docusate (COLACE) 100 mg capsule Take one capsule by mouth twice daily as needed.   ? gabapentin (NEURONTIN) 100 mg capsule Take one capsule by mouth three times daily.   ? levothyroxine (SYNTHROID) 75 mcg tablet Take one tablet by mouth daily 30 minutes before breakfast.   ? metOLazone (ZAROXOLYN) 5 mg tablet Take one tablet by mouth twice weekly. Take on Monday's and Fridays.   ? metoprolol succinate XL (TOPROL XL) 100 mg extended release tablet Take one tablet by mouth daily.   ? potassium chloride SR (KLOR-CON M20) 20 mEq tablet Take three tablets by mouth daily. Take with a meal and a full glass of water. Take extra 40 mEq on Monday's and Friday's with metolazone.   ? semaglutide (OZEMPIC) 0.25 mg or 0.5 mg (2 mg/3 mL) injection PEN Inject one-quarter mg under the skin every 7 days. After 1 month increase dose to 0.5 mg weekly.   ? sertraline (ZOLOFT) 50 mg tablet Take one tablet by mouth daily.   ? spironolactone (ALDACTONE) 25 mg tablet Take one tablet by mouth daily. Take with food.   ? tamsulosin (FLOMAX) 0.4 mg capsule Take one capsule by mouth daily. Do not crush, chew or open capsules. Take 30 minutes following the same meal each day.   ? torsemide (DEMADEX) 20 mg tablet Take three tablets by mouth twice daily.     Vitals:    04/14/22 1443   PainSc: Ten   Weight: 129.7 kg (286 lb)   Height: 165.1 cm (5' 5)     Body mass index is 47.59 kg/m?Marland Kitchen     Physical Exam  Constitutional:       General: He is not in acute distress.     Appearance:  Normal appearance.   Neurological:      Mental Status: He is oriented to person, place, and time.   Psychiatric:         Behavior: Behavior normal.       Ortho Exam  Bilateral upper extremities: The skin is intact, there is a well-healed scar in the palm of the right hand consistent with carpal tunnel surgery.  Intrinsic atrophy is noted bilaterally.  There is full range of motion of the elbow wrist and digits.  There is subjective tingling to the ring and small fingers bilaterally.  There is a mildly positive Tinel sign at the bilateral cubital tunnel.  Finger abduction strength is 4/5.  There is a mildly positive Wartenberg's sign bilaterally.       Assessment and Plan:  I reviewed his electrodiagnostic studies which show evidence of compression of the median nerve at the bilateral wrist and ulnar nerve at the bilateral elbow.  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  I would recommend treating this surgically with a right-sided cubital tunnel decompression under local anesthesia to start.  If he gets a good result we may consider the left side as well.  He would like to proceed.    The nature of the condition in general and specifically that involving the patient's condition has been reviewed in detail with the patient today.  The indications for surgical versus nonsurgical management of the condition has been advised, including the inherent risks, benefits, prognosis of the condition.  Surgical risks including infection, nerve or blood vessel injury, stiffness, chronic regional pain syndrome, incomplete recovery, incomplete relief or worsening of symptoms, recurrence have been advised.  The patient verbalizes a sound understanding of our discussion and elects to proceed with the proposed procedure.  All questions were answered and no guarantees have been given regarding the patient's condition and treatment recommendations.                       Philomena Course, MD  Associate Professor  Orthopedic Hand Surgery

## 2022-04-15 ENCOUNTER — Encounter
Admit: 2022-04-15 | Discharge: 2022-04-15 | Payer: MEDICARE | Primary: Student in an Organized Health Care Education/Training Program

## 2022-04-15 ENCOUNTER — Ambulatory Visit
Admit: 2022-04-15 | Discharge: 2022-04-15 | Payer: MEDICARE | Primary: Student in an Organized Health Care Education/Training Program

## 2022-04-15 DIAGNOSIS — G5621 Lesion of ulnar nerve, right upper limb: Secondary | ICD-10-CM

## 2022-04-15 DIAGNOSIS — I38 Endocarditis, valve unspecified: Secondary | ICD-10-CM

## 2022-04-15 DIAGNOSIS — I10 Essential (primary) hypertension: Secondary | ICD-10-CM

## 2022-04-15 DIAGNOSIS — N183 Stage 3 chronic kidney disease, unspecified whether stage 3a or 3b CKD (HCC): Secondary | ICD-10-CM

## 2022-04-15 DIAGNOSIS — M179 Osteoarthritis of knee, unspecified: Secondary | ICD-10-CM

## 2022-04-15 DIAGNOSIS — E669 Obesity, unspecified: Secondary | ICD-10-CM

## 2022-04-15 DIAGNOSIS — E785 Hyperlipidemia, unspecified: Secondary | ICD-10-CM

## 2022-04-15 DIAGNOSIS — G5603 Carpal tunnel syndrome, bilateral upper limbs: Secondary | ICD-10-CM

## 2022-04-15 DIAGNOSIS — E781 Pure hyperglyceridemia: Secondary | ICD-10-CM

## 2022-04-15 DIAGNOSIS — N401 Enlarged prostate with lower urinary tract symptoms: Secondary | ICD-10-CM

## 2022-04-15 DIAGNOSIS — I350 Nonrheumatic aortic (valve) stenosis: Secondary | ICD-10-CM

## 2022-04-15 DIAGNOSIS — R011 Cardiac murmur, unspecified: Secondary | ICD-10-CM

## 2022-04-15 DIAGNOSIS — Z720 Tobacco use: Secondary | ICD-10-CM

## 2022-04-15 DIAGNOSIS — M109 Gout, unspecified: Secondary | ICD-10-CM

## 2022-04-15 DIAGNOSIS — I4891 Unspecified atrial fibrillation: Secondary | ICD-10-CM

## 2022-04-15 MED ORDER — LIDOCAINE-EPINEPHRINE 1 %-1:100,000 IJ SOLN
50 mL | Freq: Once | 0 refills
Start: 2022-04-15 — End: ?

## 2022-04-15 MED ORDER — SODIUM BICARBONATE 1 MEQ/ML (8.4 %) IV SOLN
50 meq | Freq: Once | 0 refills
Start: 2022-04-15 — End: ?

## 2022-04-15 NOTE — Patient Instructions
Thank you for visiting our office today.    Continue the same medications as you have been doing.          We will be pursuing the following tests after your appointment today:      Follow up with Jan NP in 2 months and Dr. Sandria Manly in 6 months      We will plan to see you back in 6 months.  Please call us in the meantime with any questions or concerns.        Please allow 5-7 business days for our providers to review your results. All normal results will go to MyChart. If you do not have Mychart, it is strongly recommended to get this so you can easily view all your results. If you do not have mychart, we will attempt to call you once with normal lab and testing results. If we cannot reach you by phone with normal results, we will send you a letter.  If you have not heard the results of your testing after one week please give [place] a call.       Your Cardiovascular Medicine [place] Team Brett Canales, Pilar Jarvis and [place])  phone number is [phone].

## 2022-04-17 ENCOUNTER — Encounter
Admit: 2022-04-17 | Discharge: 2022-04-17 | Payer: MEDICARE | Primary: Student in an Organized Health Care Education/Training Program

## 2022-04-17 DIAGNOSIS — I248 Other forms of acute ischemic heart disease: Secondary | ICD-10-CM

## 2022-04-17 DIAGNOSIS — Z952 Presence of prosthetic heart valve: Secondary | ICD-10-CM

## 2022-04-17 DIAGNOSIS — I4891 Unspecified atrial fibrillation: Secondary | ICD-10-CM

## 2022-04-17 NOTE — Telephone Encounter
-----   Message from Altamease Oiler, MD sent at 04/17/2022  1:41 PM CDT -----  Repeat basic metabolic panel and NT-proBNP in 1 week.  Thank you!  ----- Message -----  From: Rogelia Boga, RN  Sent: 04/15/2022   2:31 PM CDT  To: Altamease Oiler, MD    Labs for your review.  Please let me know your recommendations.  Thank you!

## 2022-04-17 NOTE — Telephone Encounter
Called and discussed results with patient.  No questions at this time.  Pt will callback with any questions, concerns or problems.

## 2022-04-21 ENCOUNTER — Encounter
Admit: 2022-04-21 | Discharge: 2022-04-21 | Payer: MEDICARE | Primary: Student in an Organized Health Care Education/Training Program

## 2022-04-22 ENCOUNTER — Encounter
Admit: 2022-04-22 | Discharge: 2022-04-22 | Payer: MEDICARE | Primary: Student in an Organized Health Care Education/Training Program

## 2022-04-22 ENCOUNTER — Ambulatory Visit
Admit: 2022-04-22 | Discharge: 2022-04-22 | Payer: MEDICARE | Primary: Student in an Organized Health Care Education/Training Program

## 2022-04-24 ENCOUNTER — Encounter
Admit: 2022-04-24 | Discharge: 2022-04-24 | Payer: MEDICARE | Primary: Student in an Organized Health Care Education/Training Program

## 2022-04-24 DIAGNOSIS — I350 Nonrheumatic aortic (valve) stenosis: Secondary | ICD-10-CM

## 2022-04-24 DIAGNOSIS — I4891 Unspecified atrial fibrillation: Secondary | ICD-10-CM

## 2022-04-24 DIAGNOSIS — Z720 Tobacco use: Secondary | ICD-10-CM

## 2022-04-24 DIAGNOSIS — M109 Gout, unspecified: Secondary | ICD-10-CM

## 2022-04-24 DIAGNOSIS — R011 Cardiac murmur, unspecified: Secondary | ICD-10-CM

## 2022-04-24 DIAGNOSIS — E781 Pure hyperglyceridemia: Secondary | ICD-10-CM

## 2022-04-24 DIAGNOSIS — M179 Osteoarthritis of knee, unspecified: Secondary | ICD-10-CM

## 2022-04-24 DIAGNOSIS — E669 Obesity, unspecified: Secondary | ICD-10-CM

## 2022-04-24 DIAGNOSIS — I38 Endocarditis, valve unspecified: Secondary | ICD-10-CM

## 2022-04-24 DIAGNOSIS — E785 Hyperlipidemia, unspecified: Secondary | ICD-10-CM

## 2022-04-24 DIAGNOSIS — N401 Enlarged prostate with lower urinary tract symptoms: Secondary | ICD-10-CM

## 2022-04-29 ENCOUNTER — Encounter
Admit: 2022-04-29 | Discharge: 2022-04-29 | Payer: MEDICARE | Primary: Student in an Organized Health Care Education/Training Program

## 2022-04-29 DIAGNOSIS — I10 Essential (primary) hypertension: Secondary | ICD-10-CM

## 2022-04-29 DIAGNOSIS — N183 Stage 3 chronic kidney disease, unspecified whether stage 3a or 3b CKD (HCC): Secondary | ICD-10-CM

## 2022-04-29 LAB — BASIC METABOLIC PANEL
ANION GAP: 14
BLD UREA NITROGEN: 60 — ABNORMAL HIGH (ref 8.4–25.7)
CALCIUM: 9.6
CHLORIDE: 94 — ABNORMAL LOW (ref 98–107)
CO2: 31
CREATININE: 1.9 — ABNORMAL HIGH (ref 72–1.25)
GLUCOSE,PANEL: 92
POTASSIUM: 4.2
SODIUM: 139

## 2022-04-30 ENCOUNTER — Encounter
Admit: 2022-04-30 | Discharge: 2022-04-30 | Payer: MEDICARE | Primary: Student in an Organized Health Care Education/Training Program

## 2022-04-30 ENCOUNTER — Ambulatory Visit
Admit: 2022-04-30 | Discharge: 2022-05-01 | Payer: MEDICARE | Primary: Student in an Organized Health Care Education/Training Program

## 2022-04-30 DIAGNOSIS — N401 Enlarged prostate with lower urinary tract symptoms: Secondary | ICD-10-CM

## 2022-04-30 DIAGNOSIS — I4891 Unspecified atrial fibrillation: Secondary | ICD-10-CM

## 2022-04-30 DIAGNOSIS — R011 Cardiac murmur, unspecified: Secondary | ICD-10-CM

## 2022-04-30 DIAGNOSIS — E781 Pure hyperglyceridemia: Secondary | ICD-10-CM

## 2022-04-30 DIAGNOSIS — Z720 Tobacco use: Secondary | ICD-10-CM

## 2022-04-30 DIAGNOSIS — I350 Nonrheumatic aortic (valve) stenosis: Secondary | ICD-10-CM

## 2022-04-30 DIAGNOSIS — M179 Osteoarthritis of knee, unspecified: Secondary | ICD-10-CM

## 2022-04-30 DIAGNOSIS — I38 Endocarditis, valve unspecified: Secondary | ICD-10-CM

## 2022-04-30 DIAGNOSIS — M109 Gout, unspecified: Secondary | ICD-10-CM

## 2022-04-30 DIAGNOSIS — E669 Obesity, unspecified: Secondary | ICD-10-CM

## 2022-04-30 DIAGNOSIS — Z4789 Encounter for other orthopedic aftercare: Secondary | ICD-10-CM

## 2022-04-30 DIAGNOSIS — E785 Hyperlipidemia, unspecified: Secondary | ICD-10-CM

## 2022-04-30 DIAGNOSIS — G5622 Lesion of ulnar nerve, left upper limb: Secondary | ICD-10-CM

## 2022-04-30 NOTE — Patient Instructions
It was a pleasure seeing you today. Please contact us if you have any further questions, we are happy to assist.     Please call our scheduling line at [phone] if you need to schedule another visit.  Please call our nursing line at [phone] for any nursing/medical/casting or splinting questions. Do not hesitate to contact our office with any further questions.Thank you and have a great day!    Matthew Drake, MD   [facility]   Orthopedic & Sports Medicine  Office: [phone]  Fax: [phone]     Clinic nurse will schedule surgery and post-operative visit.

## 2022-04-30 NOTE — Progress Notes
The [facility] Hand Surgery      Date of Service: 04/30/2022    Subjective:            Postop check    History of Present Illness  The patient returns 1 week after his right-sided cubital tunnel release.  He states he has done well since surgery.  He no longer has numbness in his right hand and pain is gone.  The hand is feeling more functional and he has been able to actually write a letter with his right hand which he has not been able to do until recently.    He continues to have problems with his left side.  He reports constant numbness and tingling to the ring and small fingers of the left hand.  He has pain around his medial elbow and weakness and clumsiness.  Jacob Dickerson is a 76 y.o. male.     Review of Systems      Objective:         ? acetaminophen (TYLENOL EXTRA STRENGTH) 500 mg tablet Take one tablet by mouth every 4 hours as needed for Pain. Max of 4,000 mg of acetaminophen in 24 hours.   ? allopurinoL (ZYLOPRIM) 100 mg tablet Take one-half tablet by mouth daily. Indications: treatment to prevent acute gout attack   ? aluminum-magnesium hydroxide-simethicone (MAALOX MAXIMUM STRENGTH) 400-400-40 mg/5 mL suspension Take 15 mL by mouth every 6 hours as needed.   ? apixaban (ELIQUIS) 5 mg tablet Take one tablet by mouth twice daily.   ? aspirin 81 mg chewable tablet Chew one tablet by mouth daily. Take with food.   ? atorvastatin (LIPITOR) 10 mg tablet Take one tablet by mouth daily.   ? divalproex (DEPAKOTE EC) 250 mg DR tablet Take three tablets by mouth at bedtime daily. Take with food.   ? docusate (COLACE) 100 mg capsule Take one capsule by mouth twice daily as needed.   ? gabapentin (NEURONTIN) 100 mg capsule Take one capsule by mouth three times daily.   ? levothyroxine (SYNTHROID) 75 mcg tablet Take one tablet by mouth daily 30 minutes before breakfast.   ? magnesium hydroxide 1,200 mg chew Chew 30 mL by mouth daily as needed for Heartburn.   ? metOLazone (ZAROXOLYN) 5 mg tablet Take one tablet by mouth twice weekly. Take on Monday's and Fridays.   ? metoprolol succinate XL (TOPROL XL) 100 mg extended release tablet Take one tablet by mouth daily.   ? Mupirocin 2 % okit Apply  topically to affected area.   ? potassium chloride SR (KLOR-CON M20) 20 mEq tablet Take three tablets by mouth daily. Take with a meal and a full glass of water. Take extra 40 mEq on Monday's and Friday's with metolazone.   ? sertraline (ZOLOFT) 50 mg tablet Take one tablet by mouth daily.   ? spironolactone (ALDACTONE) 25 mg tablet Take one tablet by mouth daily. Take with food.   ? tamsulosin (FLOMAX) 0.4 mg capsule Take one capsule by mouth daily. Do not crush, chew or open capsules. Take 30 minutes following the same meal each day.   ? torsemide (DEMADEX) 20 mg tablet Take three tablets by mouth twice daily.     Vitals:    04/30/22 1342   PainSc: One   Weight: 129.3 kg (285 lb)   Height: 165.1 cm (5' 5)     Body mass index is 47.43 kg/m?Marland Kitchen     Physical Exam  Constitutional:  General: He is not in acute distress.     Appearance: Normal appearance.   Neurological:      Mental Status: He is oriented to person, place, and time.   Psychiatric:         Behavior: Behavior normal.       Ortho Exam  Right elbow: The surgical wound is healed, sutures are in place.  There is no erythema or drainage.  He has full range of motion of the elbow wrist and digits.  There is intact sensation and brisk cap refill to all digits.  Finger abduction strength is 5/5.    Left upper extremity: The skin is intact, there are no wounds.  He has subjective tingling to the ring and small fingers.  There is a positive Tinel sign at the cubital tunnel.  There is a negative direct compression at the carpal tunnel.  Finger abduction strength is 4/5.  There is a positive Wartenberg's sign.       Assessment and Plan:  His right side is doing well, the wound has healed.  We will take out the sutures today.  No therapy is needed.  He can resume activities as tolerated and is welcome to follow-up on an as-needed basis.    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  He does not have any carpal tunnel symptoms.  His symptoms feel similar to the right side before surgery.  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  He would like to proceed.    The nature of the condition in general and specifically that involving the patient's condition has been reviewed in detail with the patient today.  The indications for surgical versus nonsurgical management of the condition has been advised, including the inherent risks, benefits, prognosis of the condition.  Surgical risks including infection, nerve or blood vessel injury, stiffness, chronic regional pain syndrome, incomplete recovery, incomplete relief or worsening of symptoms, recurrence have been advised.  The patient verbalizes a sound understanding of our discussion and elects to proceed with the proposed procedure.  All questions were answered and no guarantees have been given regarding the patient's condition and treatment recommendations.                       Philomena Course, MD  Associate Professor  Orthopedic Hand Surgery

## 2022-05-01 ENCOUNTER — Ambulatory Visit
Admit: 2022-05-01 | Discharge: 2022-05-01 | Payer: MEDICARE | Primary: Student in an Organized Health Care Education/Training Program

## 2022-05-01 ENCOUNTER — Encounter
Admit: 2022-05-01 | Discharge: 2022-05-01 | Payer: MEDICARE | Primary: Student in an Organized Health Care Education/Training Program

## 2022-05-01 DIAGNOSIS — G5622 Lesion of ulnar nerve, left upper limb: Secondary | ICD-10-CM

## 2022-05-01 MED ORDER — LIDOCAINE-EPINEPHRINE 1 %-1:100,000 IJ SOLN
50 mL | Freq: Once | 0 refills
Start: 2022-05-01 — End: ?

## 2022-05-01 MED ORDER — SODIUM BICARBONATE 1 MEQ/ML (8.4 %) IV SOLN
50 meq | Freq: Once | 0 refills
Start: 2022-05-01 — End: ?

## 2022-05-07 ENCOUNTER — Encounter
Admit: 2022-05-07 | Discharge: 2022-05-07 | Payer: MEDICARE | Primary: Student in an Organized Health Care Education/Training Program

## 2022-05-07 MED ORDER — SPIRONOLACTONE 25 MG PO TAB
25 mg | ORAL_TABLET | Freq: Every day | ORAL | 0 refills | 90.00000 days | Status: AC
Start: 2022-05-07 — End: ?

## 2022-05-08 ENCOUNTER — Encounter
Admit: 2022-05-08 | Discharge: 2022-05-08 | Payer: MEDICARE | Primary: Student in an Organized Health Care Education/Training Program

## 2022-05-08 ENCOUNTER — Ambulatory Visit
Admit: 2022-05-08 | Discharge: 2022-05-08 | Payer: MEDICARE | Primary: Student in an Organized Health Care Education/Training Program

## 2022-05-08 DIAGNOSIS — E669 Obesity, unspecified: Secondary | ICD-10-CM

## 2022-05-08 DIAGNOSIS — E781 Pure hyperglyceridemia: Secondary | ICD-10-CM

## 2022-05-08 DIAGNOSIS — I4891 Unspecified atrial fibrillation: Secondary | ICD-10-CM

## 2022-05-08 DIAGNOSIS — I38 Endocarditis, valve unspecified: Secondary | ICD-10-CM

## 2022-05-08 DIAGNOSIS — Z720 Tobacco use: Secondary | ICD-10-CM

## 2022-05-08 DIAGNOSIS — M109 Gout, unspecified: Secondary | ICD-10-CM

## 2022-05-08 DIAGNOSIS — I350 Nonrheumatic aortic (valve) stenosis: Secondary | ICD-10-CM

## 2022-05-08 DIAGNOSIS — M179 Osteoarthritis of knee, unspecified: Secondary | ICD-10-CM

## 2022-05-08 DIAGNOSIS — R011 Cardiac murmur, unspecified: Secondary | ICD-10-CM

## 2022-05-08 DIAGNOSIS — E785 Hyperlipidemia, unspecified: Secondary | ICD-10-CM

## 2022-05-08 DIAGNOSIS — N401 Enlarged prostate with lower urinary tract symptoms: Secondary | ICD-10-CM

## 2022-05-09 ENCOUNTER — Encounter
Admit: 2022-05-09 | Discharge: 2022-05-09 | Payer: MEDICARE | Primary: Student in an Organized Health Care Education/Training Program

## 2022-05-09 DIAGNOSIS — N401 Enlarged prostate with lower urinary tract symptoms: Secondary | ICD-10-CM

## 2022-05-09 DIAGNOSIS — M179 Osteoarthritis of knee, unspecified: Secondary | ICD-10-CM

## 2022-05-09 DIAGNOSIS — E669 Obesity, unspecified: Secondary | ICD-10-CM

## 2022-05-09 DIAGNOSIS — E781 Pure hyperglyceridemia: Secondary | ICD-10-CM

## 2022-05-09 DIAGNOSIS — E785 Hyperlipidemia, unspecified: Secondary | ICD-10-CM

## 2022-05-09 DIAGNOSIS — R011 Cardiac murmur, unspecified: Secondary | ICD-10-CM

## 2022-05-09 DIAGNOSIS — Z720 Tobacco use: Secondary | ICD-10-CM

## 2022-05-09 DIAGNOSIS — M109 Gout, unspecified: Secondary | ICD-10-CM

## 2022-05-09 DIAGNOSIS — I4891 Unspecified atrial fibrillation: Secondary | ICD-10-CM

## 2022-05-09 DIAGNOSIS — I350 Nonrheumatic aortic (valve) stenosis: Secondary | ICD-10-CM

## 2022-05-09 DIAGNOSIS — I38 Endocarditis, valve unspecified: Secondary | ICD-10-CM

## 2022-05-11 ENCOUNTER — Encounter
Admit: 2022-05-11 | Discharge: 2022-05-11 | Payer: MEDICARE | Primary: Student in an Organized Health Care Education/Training Program

## 2022-05-12 NOTE — Progress Notes
Lipid Management Review    LDL   Date Value Ref Range Status   08/30/2021 71  Final   07/17/2021 64 <100 mg/dL Final   69/62/9528 60  Final     Calcium Score Date & Result: n/a  Current lipid lowering medications: atorvastatin 10mg  daily   Previous lipid lowering medications tried/failed: N/A  Next OV: 09/25/22    Action Items: Patient to get testing completed. Due Oct FLP.

## 2022-05-20 ENCOUNTER — Encounter
Admit: 2022-05-20 | Discharge: 2022-05-20 | Payer: MEDICARE | Primary: Student in an Organized Health Care Education/Training Program

## 2022-05-20 DIAGNOSIS — M109 Gout, unspecified: Secondary | ICD-10-CM

## 2022-05-20 MED ORDER — ALLOPURINOL 100 MG PO TAB
50 mg | ORAL_TABLET | Freq: Every day | ORAL | 2 refills
Start: 2022-05-20 — End: ?

## 2022-05-21 ENCOUNTER — Ambulatory Visit
Admit: 2022-05-21 | Discharge: 2022-05-22 | Payer: MEDICARE | Primary: Student in an Organized Health Care Education/Training Program

## 2022-05-21 ENCOUNTER — Encounter
Admit: 2022-05-21 | Discharge: 2022-05-21 | Payer: MEDICARE | Primary: Student in an Organized Health Care Education/Training Program

## 2022-05-21 DIAGNOSIS — Z720 Tobacco use: Secondary | ICD-10-CM

## 2022-05-21 DIAGNOSIS — N401 Enlarged prostate with lower urinary tract symptoms: Secondary | ICD-10-CM

## 2022-05-21 DIAGNOSIS — I38 Endocarditis, valve unspecified: Secondary | ICD-10-CM

## 2022-05-21 DIAGNOSIS — E781 Pure hyperglyceridemia: Secondary | ICD-10-CM

## 2022-05-21 DIAGNOSIS — E669 Obesity, unspecified: Secondary | ICD-10-CM

## 2022-05-21 DIAGNOSIS — I4891 Unspecified atrial fibrillation: Secondary | ICD-10-CM

## 2022-05-21 DIAGNOSIS — Z4789 Encounter for other orthopedic aftercare: Secondary | ICD-10-CM

## 2022-05-21 DIAGNOSIS — M109 Gout, unspecified: Secondary | ICD-10-CM

## 2022-05-21 DIAGNOSIS — R011 Cardiac murmur, unspecified: Secondary | ICD-10-CM

## 2022-05-21 DIAGNOSIS — E785 Hyperlipidemia, unspecified: Secondary | ICD-10-CM

## 2022-05-21 DIAGNOSIS — I350 Nonrheumatic aortic (valve) stenosis: Secondary | ICD-10-CM

## 2022-05-21 DIAGNOSIS — M179 Osteoarthritis of knee, unspecified: Secondary | ICD-10-CM

## 2022-05-21 NOTE — Progress Notes
The [facility] Hand Surgery      Date of Service: 05/21/2022    Subjective:            Postop check    History of Present Illness  The patient returns 2 weeks after his left-sided cubital tunnel release.  He reports no problems since surgery.  He is very pleased with his result and says the numbness in his hand is essentially gone.  He is not having any further weakness.  Jacob Dickerson is a 76 y.o. male.     Review of Systems      Objective:         ? acetaminophen (TYLENOL EXTRA STRENGTH) 500 mg tablet Take one tablet by mouth every 4 hours as needed for Pain. Max of 4,000 mg of acetaminophen in 24 hours.   ? allopurinoL (ZYLOPRIM) 100 mg tablet Take one-half tablet by mouth daily. Indications: treatment to prevent acute gout attack   ? aluminum-magnesium hydroxide-simethicone (MAALOX MAXIMUM STRENGTH) 400-400-40 mg/5 mL suspension Take 15 mL by mouth every 6 hours as needed.   ? apixaban (ELIQUIS) 5 mg tablet Take one tablet by mouth twice daily.   ? aspirin 81 mg chewable tablet Chew one tablet by mouth daily. Take with food.   ? atorvastatin (LIPITOR) 10 mg tablet Take one tablet by mouth daily.   ? divalproex (DEPAKOTE EC) 250 mg DR tablet Take three tablets by mouth at bedtime daily. Take with food.   ? docusate (COLACE) 100 mg capsule Take one capsule by mouth twice daily as needed.   ? gabapentin (NEURONTIN) 100 mg capsule Take one capsule by mouth three times daily.   ? levothyroxine (SYNTHROID) 75 mcg tablet Take one tablet by mouth daily 30 minutes before breakfast.   ? magnesium hydroxide 1,200 mg chew Chew 30 mL by mouth daily as needed for Heartburn.   ? metOLazone (ZAROXOLYN) 5 mg tablet Take one tablet by mouth twice weekly. Take on Monday's and Fridays.   ? metoprolol succinate XL (TOPROL XL) 100 mg extended release tablet Take one tablet by mouth daily.   ? Mupirocin 2 % okit Apply  topically to affected area.   ? potassium chloride SR (KLOR-CON M20) 20 mEq tablet Take three tablets by mouth daily. Take with a meal and a full glass of water. Take extra 40 mEq on Monday's and Friday's with metolazone.   ? sertraline (ZOLOFT) 50 mg tablet Take one tablet by mouth daily.   ? spironolactone (ALDACTONE) 25 mg tablet TAKE ONE TABLET BY MOUTH DAILY. TAKE WITH FOOD.   ? tamsulosin (FLOMAX) 0.4 mg capsule Take one capsule by mouth daily. Do not crush, chew or open capsules. Take 30 minutes following the same meal each day.   ? torsemide (DEMADEX) 20 mg tablet Take three tablets by mouth twice daily.     Vitals:    05/21/22 1314   PainSc: One   Weight: 129.3 kg (285 lb)   Height: 165.1 cm (5' 5)     Body mass index is 47.43 kg/m?Marland Kitchen     Physical Exam  Ortho Exam  Left upper extremity: The surgical wound over the elbow is healed, sutures are in place.  There is no erythema or drainage.  There is some resolving ecchymoses.  He has full range of motion of the elbow wrist and digits.  There is intact sensation and brisk cap refill to all digits.  Finger abduction strength is 5/5.  Assessment and Plan:  He is doing quite well, his wound has healed.  We will take out the sutures today.  No therapy is needed.  His preoperative symptoms are much better, he was kind enough to present me today with a present which was a picture he drew which states that he was not able to do so before surgery because of dexterity issues.  No further treatment is needed at this point.  He is welcome to follow-up on an as-needed basis.                         Philomena Course, MD  Associate Professor  Orthopedic Hand Surgery

## 2022-05-22 ENCOUNTER — Encounter
Admit: 2022-05-22 | Discharge: 2022-05-22 | Payer: MEDICARE | Primary: Student in an Organized Health Care Education/Training Program

## 2022-05-22 DIAGNOSIS — M109 Gout, unspecified: Secondary | ICD-10-CM

## 2022-05-22 MED ORDER — ALLOPURINOL 100 MG PO TAB
50 mg | ORAL_TABLET | Freq: Every day | ORAL | 2 refills
Start: 2022-05-22 — End: ?

## 2022-05-22 NOTE — Telephone Encounter
Not seen by [place] in over 1 year. Pt has new PCP. Refusing.

## 2022-05-29 ENCOUNTER — Encounter
Admit: 2022-05-29 | Discharge: 2022-05-29 | Payer: MEDICARE | Primary: Student in an Organized Health Care Education/Training Program

## 2022-05-29 DIAGNOSIS — M109 Gout, unspecified: Secondary | ICD-10-CM

## 2022-05-29 MED ORDER — ALLOPURINOL 100 MG PO TAB
50 mg | ORAL_TABLET | Freq: Every day | ORAL | 2 refills
Start: 2022-05-29 — End: ?

## 2022-05-29 NOTE — Telephone Encounter
Not seen in this clinic. Refusing.

## 2022-06-02 ENCOUNTER — Encounter
Admit: 2022-06-02 | Discharge: 2022-06-02 | Payer: MEDICARE | Primary: Student in an Organized Health Care Education/Training Program

## 2022-06-02 DIAGNOSIS — M109 Gout, unspecified: Secondary | ICD-10-CM

## 2022-06-02 MED ORDER — ALLOPURINOL 100 MG PO TAB
50 mg | ORAL_TABLET | Freq: Every day | ORAL | 2 refills
Start: 2022-06-02 — End: ?

## 2022-06-02 NOTE — Telephone Encounter
Pt is not ours. Refusing medication.

## 2022-06-04 ENCOUNTER — Encounter
Admit: 2022-06-04 | Discharge: 2022-06-04 | Payer: MEDICARE | Primary: Student in an Organized Health Care Education/Training Program

## 2022-06-04 DIAGNOSIS — M109 Gout, unspecified: Secondary | ICD-10-CM

## 2022-06-04 MED ORDER — ALLOPURINOL 100 MG PO TAB
50 mg | ORAL_TABLET | Freq: Every day | ORAL | 2 refills
Start: 2022-06-04 — End: ?

## 2022-06-04 NOTE — Telephone Encounter
Not seen recently in our clinic and not our pt. Refusing.

## 2022-06-09 ENCOUNTER — Encounter
Admit: 2022-06-09 | Discharge: 2022-06-09 | Payer: MEDICARE | Primary: Student in an Organized Health Care Education/Training Program

## 2022-06-09 DIAGNOSIS — M109 Gout, unspecified: Secondary | ICD-10-CM

## 2022-06-09 MED ORDER — ALLOPURINOL 100 MG PO TAB
50 mg | ORAL_TABLET | Freq: Every day | ORAL | 2 refills
Start: 2022-06-09 — End: ?

## 2022-06-23 ENCOUNTER — Encounter
Admit: 2022-06-23 | Discharge: 2022-06-23 | Payer: MEDICARE | Primary: Student in an Organized Health Care Education/Training Program

## 2022-06-23 NOTE — Telephone Encounter
Called and left message for patient to call back Valve clinic @ [phone] to do a one year post TAVR KCCQ

## 2022-06-25 ENCOUNTER — Encounter
Admit: 2022-06-25 | Discharge: 2022-06-25 | Payer: MEDICARE | Primary: Student in an Organized Health Care Education/Training Program

## 2022-06-25 DIAGNOSIS — E7800 Pure hypercholesterolemia, unspecified: Secondary | ICD-10-CM

## 2022-06-26 ENCOUNTER — Encounter
Admit: 2022-06-26 | Discharge: 2022-06-26 | Payer: MEDICARE | Primary: Student in an Organized Health Care Education/Training Program

## 2022-06-26 DIAGNOSIS — I10 Essential (primary) hypertension: Secondary | ICD-10-CM

## 2022-06-26 DIAGNOSIS — Z952 Presence of prosthetic heart valve: Secondary | ICD-10-CM

## 2022-07-24 ENCOUNTER — Encounter
Admit: 2022-07-24 | Discharge: 2022-07-24 | Payer: MEDICAID | Primary: Student in an Organized Health Care Education/Training Program

## 2022-07-24 ENCOUNTER — Ambulatory Visit
Admit: 2022-07-24 | Discharge: 2022-07-25 | Payer: MEDICAID | Primary: Student in an Organized Health Care Education/Training Program

## 2022-07-24 ENCOUNTER — Ambulatory Visit
Admit: 2022-07-24 | Discharge: 2022-07-24 | Payer: MEDICAID | Primary: Student in an Organized Health Care Education/Training Program

## 2022-07-24 DIAGNOSIS — I4821 Permanent atrial fibrillation: Secondary | ICD-10-CM

## 2022-07-24 DIAGNOSIS — I5043 Acute on chronic combined systolic (congestive) and diastolic (congestive) heart failure: Secondary | ICD-10-CM

## 2022-07-24 DIAGNOSIS — R011 Cardiac murmur, unspecified: Secondary | ICD-10-CM

## 2022-07-24 DIAGNOSIS — M109 Gout, unspecified: Secondary | ICD-10-CM

## 2022-07-24 DIAGNOSIS — Z720 Tobacco use: Secondary | ICD-10-CM

## 2022-07-24 DIAGNOSIS — I350 Nonrheumatic aortic (valve) stenosis: Secondary | ICD-10-CM

## 2022-07-24 DIAGNOSIS — I38 Endocarditis, valve unspecified: Secondary | ICD-10-CM

## 2022-07-24 DIAGNOSIS — E7849 Other hyperlipidemia: Secondary | ICD-10-CM

## 2022-07-24 DIAGNOSIS — N401 Enlarged prostate with lower urinary tract symptoms: Secondary | ICD-10-CM

## 2022-07-24 DIAGNOSIS — E785 Hyperlipidemia, unspecified: Secondary | ICD-10-CM

## 2022-07-24 DIAGNOSIS — I4891 Unspecified atrial fibrillation: Secondary | ICD-10-CM

## 2022-07-24 DIAGNOSIS — I10 Essential (primary) hypertension: Secondary | ICD-10-CM

## 2022-07-24 DIAGNOSIS — I2510 Atherosclerotic heart disease of native coronary artery without angina pectoris: Secondary | ICD-10-CM

## 2022-07-24 DIAGNOSIS — M179 Osteoarthritis of knee, unspecified: Secondary | ICD-10-CM

## 2022-07-24 DIAGNOSIS — E781 Pure hyperglyceridemia: Secondary | ICD-10-CM

## 2022-07-24 DIAGNOSIS — Z952 Presence of prosthetic heart valve: Secondary | ICD-10-CM

## 2022-07-24 DIAGNOSIS — E669 Obesity, unspecified: Secondary | ICD-10-CM

## 2022-07-24 DIAGNOSIS — N183 Stage 3 chronic kidney disease, unspecified whether stage 3a or 3b CKD (HCC): Secondary | ICD-10-CM

## 2022-07-24 LAB — BASIC METABOLIC PANEL
POTASSIUM: 3.6 MMOL/L (ref 3.5–5.1)
SODIUM: 139 MMOL/L (ref 137–147)

## 2022-07-24 MED ORDER — TORSEMIDE 20 MG PO TAB
60 mg | ORAL_TABLET | Freq: Every day | ORAL | 3 refills | 67.50000 days | Status: AC
Start: 2022-07-24 — End: ?

## 2022-07-25 ENCOUNTER — Encounter
Admit: 2022-07-25 | Discharge: 2022-07-25 | Payer: MEDICAID | Primary: Student in an Organized Health Care Education/Training Program

## 2022-07-25 DIAGNOSIS — I5043 Acute on chronic combined systolic (congestive) and diastolic (congestive) heart failure: Secondary | ICD-10-CM

## 2022-07-25 DIAGNOSIS — I2510 Atherosclerotic heart disease of native coronary artery without angina pectoris: Secondary | ICD-10-CM

## 2022-07-25 MED ORDER — METOLAZONE 5 MG PO TAB
5 mg | ORAL_TABLET | ORAL | 1 refills | 84.00000 days | Status: AC
Start: 2022-07-25 — End: ?

## 2022-08-01 ENCOUNTER — Encounter
Admit: 2022-08-01 | Discharge: 2022-08-01 | Payer: MEDICAID | Primary: Student in an Organized Health Care Education/Training Program

## 2022-08-01 DIAGNOSIS — I5043 Acute on chronic combined systolic (congestive) and diastolic (congestive) heart failure: Secondary | ICD-10-CM

## 2022-08-01 DIAGNOSIS — I4819 Other persistent atrial fibrillation: Secondary | ICD-10-CM

## 2022-08-01 LAB — BASIC METABOLIC PANEL
ANION GAP: 12 (ref 8–16)
BLD UREA NITROGEN: 70 — ABNORMAL HIGH (ref 8.4–25.7)
CALCIUM: 9.8 (ref 8.8–10)
CHLORIDE: 98 (ref 98–107)
CO2: 27 (ref 23–31)
CREATININE: 1.9 — ABNORMAL HIGH (ref 0.72–1.25)
GFR ESTIMATED: 35 — ABNORMAL LOW (ref >59)
GLUCOSE,PANEL: 147 — ABNORMAL HIGH (ref 70–105)
POTASSIUM: 4 (ref 3.5–5.1)
SODIUM: 137 mg/dL (ref 136–145)

## 2022-08-01 MED ORDER — METOPROLOL SUCCINATE 100 MG PO TB24
100 mg | ORAL_TABLET | Freq: Every day | ORAL | 3 refills | 90.00000 days | Status: AC
Start: 2022-08-01 — End: ?

## 2022-08-04 ENCOUNTER — Encounter
Admit: 2022-08-04 | Discharge: 2022-08-04 | Payer: MEDICAID | Primary: Student in an Organized Health Care Education/Training Program

## 2022-08-04 ENCOUNTER — Ambulatory Visit
Admit: 2022-08-04 | Discharge: 2022-08-04 | Payer: MEDICAID | Primary: Student in an Organized Health Care Education/Training Program

## 2022-08-04 DIAGNOSIS — I4891 Unspecified atrial fibrillation: Secondary | ICD-10-CM

## 2022-08-04 DIAGNOSIS — I38 Endocarditis, valve unspecified: Secondary | ICD-10-CM

## 2022-08-04 DIAGNOSIS — I350 Nonrheumatic aortic (valve) stenosis: Secondary | ICD-10-CM

## 2022-08-04 DIAGNOSIS — M179 Osteoarthritis of knee, unspecified: Secondary | ICD-10-CM

## 2022-08-04 DIAGNOSIS — E785 Hyperlipidemia, unspecified: Secondary | ICD-10-CM

## 2022-08-04 DIAGNOSIS — R011 Cardiac murmur, unspecified: Secondary | ICD-10-CM

## 2022-08-04 DIAGNOSIS — M109 Gout, unspecified: Secondary | ICD-10-CM

## 2022-08-04 DIAGNOSIS — E781 Pure hyperglyceridemia: Secondary | ICD-10-CM

## 2022-08-04 DIAGNOSIS — E669 Obesity, unspecified: Secondary | ICD-10-CM

## 2022-08-04 DIAGNOSIS — Z720 Tobacco use: Secondary | ICD-10-CM

## 2022-08-04 DIAGNOSIS — N401 Enlarged prostate with lower urinary tract symptoms: Secondary | ICD-10-CM

## 2022-08-04 NOTE — Patient Instructions
Continue your current medications  Monitor weight daily. If weight increases 3 pounds, take additional dose of torsemide  Limit sodium intake as much possible  If you have any questions or concerns, you can reach me best with a MyChart message or through my nurse, Dahlia Client, at [phone].

## 2022-08-07 ENCOUNTER — Encounter
Admit: 2022-08-07 | Discharge: 2022-08-07 | Payer: MEDICAID | Primary: Student in an Organized Health Care Education/Training Program

## 2022-08-07 DIAGNOSIS — N401 Enlarged prostate with lower urinary tract symptoms: Secondary | ICD-10-CM

## 2022-08-07 DIAGNOSIS — E785 Hyperlipidemia, unspecified: Secondary | ICD-10-CM

## 2022-08-07 DIAGNOSIS — Z720 Tobacco use: Secondary | ICD-10-CM

## 2022-08-07 DIAGNOSIS — I4891 Unspecified atrial fibrillation: Secondary | ICD-10-CM

## 2022-08-07 DIAGNOSIS — R011 Cardiac murmur, unspecified: Secondary | ICD-10-CM

## 2022-08-07 DIAGNOSIS — M109 Gout, unspecified: Secondary | ICD-10-CM

## 2022-08-07 DIAGNOSIS — M179 Osteoarthritis of knee, unspecified: Secondary | ICD-10-CM

## 2022-08-07 DIAGNOSIS — E669 Obesity, unspecified: Secondary | ICD-10-CM

## 2022-08-07 DIAGNOSIS — I350 Nonrheumatic aortic (valve) stenosis: Secondary | ICD-10-CM

## 2022-08-07 DIAGNOSIS — I38 Endocarditis, valve unspecified: Secondary | ICD-10-CM

## 2022-08-07 DIAGNOSIS — E781 Pure hyperglyceridemia: Secondary | ICD-10-CM

## 2022-08-07 MED ORDER — FARXIGA 5 MG PO TAB
5 mg | ORAL_TABLET | Freq: Every day | ORAL | 1 refills
Start: 2022-08-07 — End: ?

## 2022-09-19 ENCOUNTER — Encounter
Admit: 2022-09-19 | Discharge: 2022-09-19 | Payer: MEDICAID | Primary: Student in an Organized Health Care Education/Training Program

## 2022-09-19 ENCOUNTER — Ambulatory Visit
Admit: 2022-09-19 | Discharge: 2022-09-19 | Payer: MEDICAID | Primary: Student in an Organized Health Care Education/Training Program

## 2022-09-19 DIAGNOSIS — R0989 Other specified symptoms and signs involving the circulatory and respiratory systems: Secondary | ICD-10-CM

## 2022-09-19 DIAGNOSIS — M109 Gout, unspecified: Secondary | ICD-10-CM

## 2022-09-19 DIAGNOSIS — Z952 Presence of prosthetic heart valve: Secondary | ICD-10-CM

## 2022-09-19 DIAGNOSIS — M179 Osteoarthritis of knee, unspecified: Secondary | ICD-10-CM

## 2022-09-19 DIAGNOSIS — I10 Essential (primary) hypertension: Secondary | ICD-10-CM

## 2022-09-19 DIAGNOSIS — I38 Endocarditis, valve unspecified: Secondary | ICD-10-CM

## 2022-09-19 DIAGNOSIS — R011 Cardiac murmur, unspecified: Secondary | ICD-10-CM

## 2022-09-19 DIAGNOSIS — E781 Pure hyperglyceridemia: Secondary | ICD-10-CM

## 2022-09-19 DIAGNOSIS — I350 Nonrheumatic aortic (valve) stenosis: Secondary | ICD-10-CM

## 2022-09-19 DIAGNOSIS — E785 Hyperlipidemia, unspecified: Secondary | ICD-10-CM

## 2022-09-19 DIAGNOSIS — Z720 Tobacco use: Secondary | ICD-10-CM

## 2022-09-19 DIAGNOSIS — I4891 Unspecified atrial fibrillation: Secondary | ICD-10-CM

## 2022-09-19 DIAGNOSIS — N401 Enlarged prostate with lower urinary tract symptoms: Secondary | ICD-10-CM

## 2022-09-19 DIAGNOSIS — E669 Obesity, unspecified: Secondary | ICD-10-CM

## 2022-09-19 LAB — BASIC METABOLIC PANEL
CO2: 32 MMOL/L — ABNORMAL HIGH (ref 21–30)
POTASSIUM: 4 MMOL/L (ref 3.5–5.1)
SODIUM: 140 MMOL/L (ref 137–147)

## 2022-09-19 LAB — CBC
HEMOGLOBIN: 14 g/dL — ABNORMAL HIGH (ref 13.5–16.5)
RBC COUNT: 4.4 M/UL (ref 4.4–5.5)
WBC COUNT: 9.1 K/UL (ref 4.5–11.0)

## 2022-09-19 LAB — BNP (B-TYPE NATRIURETIC PEPTI): BNP: 128 pg/mL — ABNORMAL HIGH (ref 0–100)

## 2022-09-19 MED ORDER — SODIUM CHLORIDE 0.9 % IJ SOLN
10 mL | Freq: Once | INTRAVENOUS | 0 refills | Status: CP
Start: 2022-09-19 — End: ?
  Administered 2022-09-19: 20:00:00 10 mL via INTRAVENOUS

## 2022-09-19 MED ORDER — PERFLUTREN LIPID MICROSPHERES 1.1 MG/ML IV SUSP
1-10 mL | Freq: Once | INTRAVENOUS | 0 refills | Status: CP | PRN
Start: 2022-09-19 — End: ?
  Administered 2022-09-19: 20:00:00 3 mL via INTRAVENOUS

## 2022-09-25 ENCOUNTER — Encounter
Admit: 2022-09-25 | Discharge: 2022-09-25 | Payer: MEDICAID | Primary: Student in an Organized Health Care Education/Training Program

## 2022-09-25 DIAGNOSIS — I10 Essential (primary) hypertension: Secondary | ICD-10-CM

## 2022-09-25 DIAGNOSIS — M109 Gout, unspecified: Secondary | ICD-10-CM

## 2022-09-25 DIAGNOSIS — E7849 Other hyperlipidemia: Secondary | ICD-10-CM

## 2022-09-25 DIAGNOSIS — I350 Nonrheumatic aortic (valve) stenosis: Secondary | ICD-10-CM

## 2022-09-25 DIAGNOSIS — I4821 Permanent atrial fibrillation: Secondary | ICD-10-CM

## 2022-09-25 DIAGNOSIS — Z720 Tobacco use: Secondary | ICD-10-CM

## 2022-09-25 DIAGNOSIS — M179 Osteoarthritis of knee, unspecified: Secondary | ICD-10-CM

## 2022-09-25 DIAGNOSIS — R011 Cardiac murmur, unspecified: Secondary | ICD-10-CM

## 2022-09-25 DIAGNOSIS — N401 Enlarged prostate with lower urinary tract symptoms: Secondary | ICD-10-CM

## 2022-09-25 DIAGNOSIS — I38 Endocarditis, valve unspecified: Secondary | ICD-10-CM

## 2022-09-25 DIAGNOSIS — E781 Pure hyperglyceridemia: Secondary | ICD-10-CM

## 2022-09-25 DIAGNOSIS — E669 Obesity, unspecified: Secondary | ICD-10-CM

## 2022-09-25 DIAGNOSIS — I4891 Unspecified atrial fibrillation: Secondary | ICD-10-CM

## 2022-09-25 DIAGNOSIS — E785 Hyperlipidemia, unspecified: Secondary | ICD-10-CM

## 2022-09-25 NOTE — Progress Notes
Date of Service: 09/25/2022    Jacob [place] is a 77 y.o. male.       Chief Complaint: Follow-up    History of Present Illness:     I had the pleasure of seeing Jacob [place]) Jacob [place] in our [place] office this afternoon for cardiovascular followup.      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      I last saw Jacob [place] back in July.  Since then he has continued to follow closely with Satira Anis, NP and Barnett Applebaum, MD.  His volume status has been excellently optimized.  His dry weight is felt to be 280-285 pounds which is exactly where he has been.  He feels well.  He is not having chest discomfort or shortness of breath.  No significant swelling in his legs.  He denies lightheadedness, dizziness, palpitations, orthopnea, paroxysmal nocturnal dyspnea.    Jacob [place] is taking metolazone 5 mg once a week.  He is also on torsemide 60 mg twice daily.  He is not yet back on lisinopril as his kidney function has been slightly worse on recent lab work.    I did review Jacob [place]'s blood pressures and heart rates from home.  His blood pressures have been running on the low side.  In the office today is 102/56 mmHg.  His heart rates have been a little on the higher side.  He pretty consistently been around 90 bpm.  Historically they have been closer to 80 bpm.  He tells me the last 2 days they have been right at 100 bpm.  He is not sure why.         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/08/2021     08/08/21 - Valve in valve TAVR - [name]      Atherosclerosis of aorta (HCC) 08/08/2021    Acute blood loss anemia 07/31/2021    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) 07/24/2021    Poor dentition requiring referral to dentistry 07/24/2021    Stage 3b chronic kidney disease (HCC) 07/23/2021    Chronic anticoagulation 07/23/2021     Eliquis for afib      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, adult (HCC) 07/23/2021    Acute on chronic combined systolic and diastolic CHF, NYHA class 2 (HCC) 07/23/2021    Mild mitral valve regurgitation 07/23/2021    Ruptured chordae tendineae (HCC) 07/23/2021     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 07/23/21      BPH (benign prostatic hyperplasia) 07/23/2021    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    Chronic back pain 07/23/2021    Major depressive disorder 07/23/2021  Osteoarthritis of knee 07/23/2021    Gout 07/23/2021    Cardiomegaly 07/23/2021    Edema 07/15/2021    Morbid obesity with BMI of 40.0-44.9, adult (HCC) 05/29/2021    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   Occasional pain in it but able to use it full time.      Primary hypertension 12/17/2010    Hyperlipidemia 12/17/2010    Gout attack 11/25/2010    Bladder stone 06/29/2009    S/P AVR (aortic valve replacement) 12/31/2007     01/08/08 [facility] - Dr [name]  using porcine-type valve  03/31/08 Echo:EF 55%. mild left ventricular hypertrophy, 17mm gradient across the aortic valve with mildly enlarged aortic root.    10/18/15- [facility]- Lexiscan MPI- Normal LV perfusion. Imaging is negative for ischemia. Global LV systolic function normal with EF 64%. Normal wall motion. Small, fixed, basal- inferolateral defect.  11/12/17- [facility]- Echo- Mild concentric LV hypertrophy. Normal LV function EF 55-60%. Well seated bioprosthetic valve. Mild tricuspid insufficiency. Mildly dilated aortic root.          Sprain and strain of other specified sites of shoulder and upper arm 02/02/2007       Review of Systems   Constitutional: Negative.   HENT:  Positive for tinnitus.    Eyes: Negative.    Cardiovascular: Negative.    Respiratory: Negative.     Endocrine: Negative.    Hematologic/Lymphatic: Negative.    Skin: Negative.    Musculoskeletal: Negative.    Gastrointestinal: Negative.    Genitourinary: Negative.    Neurological: Negative.    Psychiatric/Behavioral: Negative.     Allergic/Immunologic: Negative.        Vitals:    09/25/22 0847   BP: 102/56   BP Source: Arm, Left Upper   Pulse: 100   SpO2: 95%   O2 Device: None (Room air)   PainSc: Zero   Weight: 132.5 kg (292 lb)   Height: 165.1 cm (5' 5)     Body mass index is 48.59 kg/m?Marland Kitchen    Physical Examination:  General Appearance: No acute distress. Fully alert and oriented.  Skin: Warm. No ulcers or xanthomas.   HEENT: Grossly unremarkable. Lips and oral mucosa without pallor or cyanosis. Moist mucous membranes.   Neck Veins: Normal jugular venous pressure. Neck veins are not distended.  Carotid Arteries: Normal carotid upstroke bilaterally. No bruits.  Chest Inspection: Midline scar consistent with prior sternotomy.  Auscultation/Percussion: Normal respiratory effort. Lungs clear to auscultation bilaterally. No wheezes, rales, or rhonchi.    Cardiac Rhythm: Regular rhythm. Normal rate.  Cardiac Auscultation: Normal S1 & S2. No S3 or S4. No rub.  Murmurs: No cardiac murmurs.  Peripheral Circulation: Normal peripheral circulation.   Abdominal Aorta: No abdominal aortic bruit.  Extremities: Appropriately warm to touch. No lower extremity edema.  Abdominal Exam: Soft, non-tender. No masses, no organomegaly. Normal bowel sounds.  Neurologic Exam: Neurological assessment grossly intact.       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  He is on torsemide 60 mg twice daily and spironolactone 25 mg daily.  He is taking metolazone 5 mg once weekly.  He also appears euvolemic on exam.  His weight has consistently been between 280 and 285 pounds at home.  Current guideline directed medical therapy includes metoprolol succinate 100 mg daily, spironolactone 25 mg daily, and empagliflozin 10 mg daily.  Lisinopril has been held recently due to worsened renal function.  As his blood pressure is running low today I think I will hold off on starting the lisinopril for the time being.  Continue current regimen for now.  If blood pressure improves then would start lisinopril 2.5 or 5 mg daily and see if he tolerates it.  He has had issues with positional lightheadedness in the past and was orthostatic on vital signs and his nephrology visit back in [place].  Some of this may be dehydration in the setting of aggressive diuresis.    Nonobstructive coronary artery disease:  He had angiogram 07/29/2021 with mild nonobstructive coronary artery disease.  Continue aspirin 81 mg daily.  Atorvastatin 10 mg daily.  Repeat fasting lipid panel in 3 months.  Goal LDL is less than 70.    Aortic valve replacement:  Most recent echocardiogram from Jan 29, 2022 showed normal functioning bioprosthetic valve.    Hypertension: As detailed above.  He had orthostatic vital signs at recent nephrology visit.  His blood pressure is on the low side today.  I do not think there is room to add lisinopril to his guideline directed medical therapy.  Continue current regimen for now.    Permanent atrial fibrillation.  His heart rates have been a little bit higher at home.  Around 90 beats a minute for the last week.  In the office today he was on 100 beats a minute on arrival but on ECG after our visit was 90 bpm.  I wonder if some of this is related to dehydration in setting of aggressive diuresis.  If he continues to have elevated heart rates we may need to back down some on his torsemide dosing or use of metolazone.  We are going to call him in about a week and see what his heart rates have look like.  If consistently greater than 100 beats a minute we will hold the metolazone for a week and see how he does.  Continue apixaban 5 mg b.i.d.    Chronic kidney disease:  He has stage IIIB chronic kidney disease.  Seems to have stabilized some here recently with a creatinine of 1.9-2.0.  Continue current medication regimen.  We are holding on lisinopril for the time being.    Dyslipidemia:  As above.  His goal LDL is probably less than 70.  Continue atorvastatin 10 mg at bedtime.  Repeat fasting lipid panel in 3 months.      I really enjoyed seeing Jacob [place] in the office today. I will arrange followup with Myrtie Hawk, APRN-NP in 3 months.  I will see him back in the office in 6 months.  If I can be of any assistance in the interim, please do not hesitate to reach out with questions or concerns.         Total time spent on today's office visit was 35 minutes. This includes face-to-face in person visit with patient as well as non face-to-face time including review of the electronic medical record, outside records, labs,  radiologic studies, cardiovascular studies, formulation of treatment plan, after visit summary, future disposition, personal discussions, and documentation.    Current Medications (including today's revisions)   acetaminophen (TYLENOL EXTRA STRENGTH) 500 mg tablet Take one tablet by mouth every 4 hours as needed for Pain. Max of 4,000 mg of acetaminophen in 24 hours.    allopurinoL (ZYLOPRIM) 100 mg tablet Take one-half tablet by mouth daily. Indications: treatment to prevent acute gout attack (Patient taking differently: Take one tablet by mouth twice daily. Indications: treatment to prevent acute gout attack)    aluminum-magnesium hydroxide-simethicone (MAALOX MAXIMUM STRENGTH) 400-400-40 mg/5 mL suspension Take 15 mL by mouth every 6 hours as needed.    apixaban (ELIQUIS) 5 mg tablet Take one tablet by mouth twice daily.    aspirin 81 mg chewable tablet Chew one tablet by mouth daily. Take with food.    atorvastatin (LIPITOR) 10 mg tablet Take one tablet by mouth daily.    divalproex (DEPAKOTE EC) 250 mg DR tablet Take three tablets by mouth at bedtime daily. Take with food.    docusate (COLACE) 100 mg capsule Take one capsule by mouth twice daily as needed.    empagliflozin (JARDIANCE) 10 mg tablet Take one tablet by mouth daily.    levothyroxine (SYNTHROID) 75 mcg tablet Take one tablet by mouth daily 30 minutes before breakfast.    magnesium hydroxide 1,200 mg chew Chew 30 mL by mouth daily as needed for Heartburn.    metOLazone (ZAROXOLYN) 5 mg tablet Take one tablet by mouth every 7 days. Take on Fridays.    metoprolol succinate XL (TOPROL XL) 100 mg extended release tablet TAKE 1 TABLET BY MOUTH EVERY DAY    oxyCODONE-acetaminophen (PERCOCET) 5-325 mg tablet Take one tablet by mouth every 6 hours as needed for Pain (may take up to two tablets).    potassium chloride SR (KLOR-CON M20) 20 mEq tablet Take two tablets by mouth twice daily. Take with a meal and a full glass of water. Take extra 40 mEq on Friday's with metolazone.    sertraline (ZOLOFT) 50 mg tablet Take one tablet by mouth daily. spironolactone (ALDACTONE) 25 mg tablet TAKE ONE TABLET BY MOUTH DAILY. TAKE WITH FOOD.    tamsulosin (FLOMAX) 0.4 mg capsule Take one capsule by mouth daily. Do not crush, chew or open capsules. Take 30 minutes following the same meal each day.    torsemide (DEMADEX) 20 mg tablet Take three tablets by mouth daily.

## 2022-09-25 NOTE — Patient Instructions
Thank you for visiting our office today.    Continue the same medications as you have been doing.          We will be pursuing the following tests after your appointment today:       Orders Placed This Encounter    THYROID STIMULATING HORMONE-TSH    LIPID PROFILE    ECG Today (all locations)         We will plan to see you back in 6 weeks months. Please call scheduling at [phone] to schedule follow up with Julien Girt.  Please call us in the meantime with any questions or concerns.        Please allow 5-7 business days for our providers to review your results. All normal results will go to MyChart. If you do not have Mychart, it is strongly recommended to get this so you can easily view all your results. If you do not have mychart, we will attempt to call you once with normal lab and testing results. If we cannot reach you by phone with normal results, we will send you a letter.  If you have not heard the results of your testing after one week please give [place] a call.       Your Cardiovascular Medicine [place] Team [place], Rene Kocher and [place])  phone number is [phone].

## 2022-10-02 ENCOUNTER — Encounter
Admit: 2022-10-02 | Discharge: 2022-10-02 | Payer: MEDICAID | Primary: Student in an Organized Health Care Education/Training Program

## 2022-10-02 DIAGNOSIS — Z720 Tobacco use: Secondary | ICD-10-CM

## 2022-10-02 DIAGNOSIS — E781 Pure hyperglyceridemia: Secondary | ICD-10-CM

## 2022-10-02 DIAGNOSIS — I4891 Unspecified atrial fibrillation: Secondary | ICD-10-CM

## 2022-10-02 DIAGNOSIS — I38 Endocarditis, valve unspecified: Secondary | ICD-10-CM

## 2022-10-02 DIAGNOSIS — M179 Osteoarthritis of knee, unspecified: Secondary | ICD-10-CM

## 2022-10-02 DIAGNOSIS — R011 Cardiac murmur, unspecified: Secondary | ICD-10-CM

## 2022-10-02 DIAGNOSIS — I350 Nonrheumatic aortic (valve) stenosis: Secondary | ICD-10-CM

## 2022-10-02 DIAGNOSIS — E669 Obesity, unspecified: Secondary | ICD-10-CM

## 2022-10-02 DIAGNOSIS — N401 Enlarged prostate with lower urinary tract symptoms: Secondary | ICD-10-CM

## 2022-10-02 DIAGNOSIS — E785 Hyperlipidemia, unspecified: Secondary | ICD-10-CM

## 2022-10-02 DIAGNOSIS — M109 Gout, unspecified: Secondary | ICD-10-CM

## 2022-11-17 ENCOUNTER — Encounter
Admit: 2022-11-17 | Discharge: 2022-11-17 | Payer: 59 | Primary: Student in an Organized Health Care Education/Training Program

## 2022-11-17 ENCOUNTER — Ambulatory Visit
Admit: 2022-11-17 | Discharge: 2022-11-17 | Payer: 59 | Primary: Student in an Organized Health Care Education/Training Program

## 2022-11-17 DIAGNOSIS — N1832 Stage 3b chronic kidney disease (HCC): Secondary | ICD-10-CM

## 2022-11-17 DIAGNOSIS — E7849 Other hyperlipidemia: Secondary | ICD-10-CM

## 2022-11-17 DIAGNOSIS — M109 Gout, unspecified: Secondary | ICD-10-CM

## 2022-11-17 DIAGNOSIS — I38 Endocarditis, valve unspecified: Secondary | ICD-10-CM

## 2022-11-17 DIAGNOSIS — M179 Osteoarthritis of knee, unspecified: Secondary | ICD-10-CM

## 2022-11-17 DIAGNOSIS — Z952 Presence of prosthetic heart valve: Secondary | ICD-10-CM

## 2022-11-17 DIAGNOSIS — Z720 Tobacco use: Secondary | ICD-10-CM

## 2022-11-17 DIAGNOSIS — E785 Hyperlipidemia, unspecified: Secondary | ICD-10-CM

## 2022-11-17 DIAGNOSIS — I4821 Permanent atrial fibrillation: Secondary | ICD-10-CM

## 2022-11-17 DIAGNOSIS — N401 Enlarged prostate with lower urinary tract symptoms: Secondary | ICD-10-CM

## 2022-11-17 DIAGNOSIS — I4891 Unspecified atrial fibrillation: Secondary | ICD-10-CM

## 2022-11-17 DIAGNOSIS — I5043 Acute on chronic combined systolic (congestive) and diastolic (congestive) heart failure: Secondary | ICD-10-CM

## 2022-11-17 DIAGNOSIS — E669 Obesity, unspecified: Secondary | ICD-10-CM

## 2022-11-17 DIAGNOSIS — R011 Cardiac murmur, unspecified: Secondary | ICD-10-CM

## 2022-11-17 DIAGNOSIS — E781 Pure hyperglyceridemia: Secondary | ICD-10-CM

## 2022-11-17 DIAGNOSIS — I350 Nonrheumatic aortic (valve) stenosis: Secondary | ICD-10-CM

## 2022-11-17 DIAGNOSIS — I10 Essential (primary) hypertension: Secondary | ICD-10-CM

## 2022-11-17 LAB — BASIC METABOLIC PANEL
ANION GAP: 13 — ABNORMAL HIGH (ref 3–12)
BLD UREA NITROGEN: 55 mg/dL — ABNORMAL HIGH (ref 7–25)
CALCIUM: 9 mg/dL (ref 8.5–10.6)
CHLORIDE: 92 MMOL/L — ABNORMAL LOW (ref 98–110)
CO2: 32 MMOL/L — ABNORMAL HIGH (ref 21–30)
CREATININE: 2.2 mg/dL — ABNORMAL HIGH (ref 0.4–1.24)
EGFR: 29 mL/min — ABNORMAL LOW (ref >60)
GLUCOSE,PANEL: 108 mg/dL — ABNORMAL HIGH (ref 70–100)
POTASSIUM: 4.6 MMOL/L (ref 3.5–5.1)
SODIUM: 137 MMOL/L (ref 137–147)

## 2022-11-17 NOTE — Progress Notes
Heart Failure Clinic Visit    Patient Name: Jacob, Dickerson   MRN: [id number]  DOB: 1946/08/16   Primary Cardiologist: Dr. Alger Simons   Date of Service: 11/17/2022     HPI       Jacob Dickerson was seen today in HF clinic for routine follow-up visit.  He was last seen in cardiology clinic by his primary cardiologist Dr. Sandria Manly on 04/15/22.  During that visit he was thought to be mildly hypervolemic however his diuretics were not adjusted.  It was discussed with him he would benefit from addition of GLP-1 agonist however he wanted to think about it.  There has been discussion of consideration of CCM vs CardioMEMS to assist with management of his symptoms. Dr. Sandria Manly noted that Mr. Delamarter would be reasonable candidate for both. Mr. 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    Today Mr. Ante reports since he last saw Dr. Sandria Manly he has been doing okay.  He states his weight increased slightly, he has been working on low-salt and trying to get his weight back down.  He describes shortness of breath with exertion only.  No chest pain or pressures, denies palpitations.  No dizziness/lightheadedness.  Denies lower extreme edema, no abdominal distention.  He states he is sleeping okay.  His weights have ranged from 294 down to 291 pounds recently.  Overall he has been doing well.    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          Vitals:    11/17/22 1332   BP: 102/50   BP Source: Arm, Right Upper   Pulse: 103   SpO2: 94%   O2 Device: None (Room air)   PainSc: Zero   Weight: 134.4 kg (296 lb 3.2 oz)   Height: 165.1 cm (5' 5)     Body mass index is 49.29 kg/m?Marland Kitchen      Physical Exam   General Appearance: obese male in no acute distress  Neck Veins: difficult to assess r/t body habitus   Respiratory Effort: breathing comfortably, no respiratory distress   Auscultation: non-labored respiratory pattern, lungs clear to auscultation, no rales, rhonchi or wheezing   Cardiac Rhythm: regular rhythm and normal rate   Cardiac Auscultation: S1, S2 normal, no definite S3/S4   Murmurs: no murmur    Peripheral Circulation: normal peripheral circulation   Lower Extremity Edema: no edema noted   Abdominal Exam: obese, soft, non-tender, non-distended, bowel sounds present  Gait & Station: using Rolator walker to ambulate   Orientation: oriented to person, place and time   Affect & Mood: appropriate and sustained affect   Language and Memory: patient responsive and seems to comprehend information   Neurologic Exam: neurological assessment grossly intact   Vital signs reviewed    Cardiovascular Studies    09/19/22 Echo:    Technically difficult study.    The left ventricular size is normal. Wall thickness is increased. Mild concentric hypertrophy. The left ventricular systolic function is moderately reduced. The visually estimated ejection fraction is 40%. Abnormal septal motion consistent with left bundle branch block.    The right ventricular size is normal. The right ventricular systolic function is normal.    Left Atrium: Mildly dilated. Right Atrium: Moderately dilated.  Aortic Valve: There is a 26 mm Edwards SAPIEN bioprosthetic valve present. The prosthetic valve is normal. No stenosis.  MG = 7 mmHg, DVI = 0.5.  No regurgitation.    The ascending aorta is moderately dilated.    No pericardial effusion.    Compared with study dated 01/29/2022, no significant change is noted.    02/28/22 RHC:  RA pressure 4 mmHg with a V-wave of 7 mmHg.  RV pressure 23/2 mmHg with an RVEDP of 4 mmHg.  PA pressure 23/13 mmHg with a mean PA pressure of 16 mmHg.  Pulmonary capillary wedge pressure of 5 mmHg with a V-wave of 6 mmHg.  Transpulmonary gradient of 11 mmHg.  PA sat 65%, RA sat 65%, pulse ox 94%.  Blood pressure 98/66 mmHg and heart rate 77 beats per minute.  Body surface area 2.27 and hemoglobin 15.9.  Cardiac output by Fick 4.52 L/minute and by thermo 3.4 L/minute.  Cardiac index by Fick 2.0 L/minute/m2 and by thermo 1.5 L/minutes/m2.  PVR by Fick was 2.4.  PVR by thermo was 3.2.    07/29/21 Right and Left heart cath:    HEMODYNAMICS OF RIGHT HEART CATHETERIZATION:    Body surface area 2.20 m2.  Hemoglobin percent is 12 grams/dL.  Blood pressure 123/58 mmHg with a mean of 83 mmHg.  Heart rate 75 beats per minute.     SATURATIONS:    Aorta saturation 99%.  Right atrial saturation 66%.  Pulmonary artery saturation 68%.     PRESSURES:    Right atrial pressure 8 mmHg.  Right ventricular pressure 26/8 mmHg.  Pulmonary artery pressure 31/13 mmHg with a mean of 19 mmHg.  Pulmonary capillary wedge pressure of 13 mmHg.  Transpulmonary gradient of 6 mmHg.  Diastolic pulmonary gradient of 0 mmHg.  Cardiac output by Fick method was 5.47 L/minute and by thermodilution technique was 4.75 L/minute.  Cardiac index by Fick method was 2.47 L/minute per sq m and by thermodilution technique was 2.16 L/minute per sq m.  Pulmonary vascular resistance was 1.09 Woods unit.  Systemic vascular resistance was 1097 dynes.second.centimeter 5.     HEMODYNAMICS OF LEFT HEART CATHETERIZATION:  The left ventricular systolic pressure was 106 mmHg.  The aortic pressure was 75/33 mmHg.  The gradient across the aortic valve was 31 mmHg, which was suggestive of moderate aortic stenosis.  The left ventricular end-diastolic pressure was 12 mmHg.     SELECTIVE CORONARY ANGIOGRAPHY:    Left main coronary artery:  The left main coronary artery arises normally from the left coronary sinus.  The left main artery bifurcates into the left anterior descending artery and left circumflex artery.  The left main coronary artery is free of angiographically significant disease.  Left anterior descending artery:  The left anterior descending artery is a large caliber vessel.  The mid LAD has a focal area of 30% stenosis after the 1st diagonal branch.  This is a type 3 LAD.  It gives rise to a medium caliber diagonal branch which gives rise to a superior inferior branch which is free of angiographically significant disease.  Left circumflex artery:  The left circumflex artery is a large caliber vessel which gives rise to a high 1st obtuse marginal branch followed by two additional obtuse marginal branches.  The left circumflex artery as well as the branches are free of angiographically significant disease.  Right coronary artery:  The right coronary artery is a large caliber dominant vessel which arises normally from the right coronary sinus.  The right coronary artery distally bifurcates into the right posterior descending artery and right posterolateral branch.  The right coronary as well as its branches are free of angiographically significant disease.    FINAL IMPRESSION:  Normal right and left-sided filling pressures.  Normal cardiac output and cardiac index by Fick method.  The gradient across the aortic valve was 31 mmHg, which is suggestive of moderate aortic stenosis.  Mild nonobstructive coronary artery disease as described above.    The left ventricular end-diastolic pressure was 12 mmHg.    07/17/21 PYP scan:  This study is normal and not suggestive of ATTR cardiac amyloidosis.This study does not exclude AL Amyloidosis.  If the clinical suspicion of amyloidosis is intermediate to high consider serum free light chain and serum and urine immunofixation.      Cardiovascular Health Factors  Vitals BP Readings from Last 3 Encounters:   11/17/22 102/50   09/25/22 102/56   09/19/22 124/60     Wt Readings from Last 3 Encounters:   11/17/22 134.4 kg (296 lb 3.2 oz)   09/25/22 132.5 kg (292 lb)   09/19/22 130.2 kg (287 lb)     BMI Readings from Last 3 Encounters:   11/17/22 49.29 kg/m?   09/25/22 48.59 kg/m?   09/19/22 47.76 kg/m? Smoking Social History     Tobacco Use   Smoking Status Former    Current packs/day: 0.00    Average packs/day: 0.3 packs/day for 30.0 years (7.5 ttl pk-yrs)    Types: Cigarettes    Start date: 05/12/1979    Quit date: 05/11/2009    Years since quitting: 13.5   Smokeless Tobacco Never   Tobacco Comments    quit 2008      Lipid Profile Cholesterol   Date Value Ref Range Status   08/30/2021 143  Final     HDL   Date Value Ref Range Status   08/30/2021 26 (L) >=40 Final     LDL   Date Value Ref Range Status   08/30/2021 71  Final     Triglycerides   Date Value Ref Range Status   08/30/2021 232 (H) <150 Final      Blood Sugar Hemoglobin A1C   Date Value Ref Range Status   02/21/2022 6.0 (H) 4.0 - 5.7 % Final     Comment:     The ADA recommends that most patients with type 1 and type 2 diabetes maintain   an A1c level <7%.       Glucose   Date Value Ref Range Status   09/19/2022 86 70 - 100 MG/DL Final   86/57/8469 629 (H) 70 - 105 Final   07/24/2022 90 70 - 100 MG/DL Final   52/84/1324 88 65 - 99 mg/dL Final     Comment:                   Fasting reference interval      04/17/2006 89 70 - 110 MG/DL Final   40/06/2724 86 70 - 110 MG/DL Final     Glucose, POC   Date Value Ref Range Status   01/02/2008 117 (H) 70 - 110 MG/DL Final   36/64/4034 742 (H) 70 - 110 MG/DL Final   59/56/3875 643 (H) 70 - 110 MG/DL Final           Problems Addressed Today  Encounter Diagnoses   Name Primary?    Other hyperlipidemia     Primary hypertension     Permanent  atrial fibrillation (HCC)     Morbid obesity with BMI of 40.0-44.9, adult (HCC)           Assessment/Plan:    Acute on chronic HFrEF due to NICM   Mr. Kosman today is describing NYHA Functional Class II and AHA stage C symptoms. Currently taking Torsemide 60 mg daily along with Metolazone 5 mg on Fridays only.  He takes K-Dur 40 mEq BID along with additional 40 mEq on metolazone days. He appears euvolemic today, however labs which were checked after our office visit indicates he may be slightly dry.  Therefore will decrease his torsemide down to 40 mg daily, also decrease metolazone down to 2.5 mg once/week. Will also decrease his Kdur down to 40 mEq daily and decrease his extra dose on Metolazone days down to 20 mEq.     Estimated dry weight is 285-290 lbs.       GDMT Current Dose Changes made at visit   BB Toprol XL 100 mg daily No changes    ACEI/ARB/ARNI N/A    SGLT-2 Inhibitor *previously on Farxiga 10 mg daily which has been held since 12/2021 *defer with renal function   Aldosterone Antagonist Spironolactone 25 mg daily   No changes    Hydralazine/Nitrate N/A    Ivabradine N/A      Non-obstructive CAD    Most recent ischemic evaluation was coronary angiogram on 07/29/21 which revealed mild nonobstructive CAD, no interventions. He denies having any acute coronary symptoms today. Continue on Asa 81 mg daily and Atorvastatin 10 mg daily.     Aortic Regurgitation s/p SAVR (2009) with repeat TAVR (2022)  Echo on 01/29/22 noted the aortic valve was not well seen, 26 mm Edwards SAPIEN bioprosthetic valve was present, with normal function, no stenosis/regurgitation, mean gradient 7 mmHg.    HTN  Blood pressure today in clinic is 102/50. Continue on Toprol XL 100 mg daily per table above.     AFib  Today on exam heart rate is regular rate and rhythm. Currently anti-coagulated with Eliquis 5 mg BID and taking Toprol Xl for rate control.  Denies having missed doses, no s/s of bleeding reported.       CKD IIIb  Labs checked after visit today shows creatinine of 2.29 with BUN 55. Recently creatinine has ranged 1.9-2.2. Will need to plan to repeat BMP in approximately 1 week to follow his renal function after decreasing his diuretic dose today. He will be seen by Nephrology team on 03/23/23.    Dyslipidemia   Currently receiving Atorvastatin 10 mg po daily.  Denies myalgias. Lipid profile as noted below. Will need to repeat Fasting Lipid panel in the upcoming appointments.   Lab Results   Component Value Date    CHOL 143 08/30/2021    TRIG 232 (H) 08/30/2021    HDL 26 (L) 08/30/2021    LDL 71 08/30/2021    VLDL 46 (H) 08/30/2021    NONHDLCHOL 95 07/17/2021    CHOLHDLC 6 (H) 08/30/2021     Obesity  Today's weight is 296 lbs with Body mass index is 49.29 kg/m?Marland Kitchen Encouraged weight loss, increasing activity, and cardiac diet. He would benefit from consideration of GLP-1 agonists however has previously declined to try Ozempic.     Follow up  Appointment scheduled with Dr. Sandria Manly on 06/23/23. Will ask his facility to repeat labs in ~1 week after decreasing his diuretic dose today to follow his renal function. Mr. Geffre is in agreement and states understanding of  this plan. Please see AVS for full patient education.          Total Time Today was 40 minutes in the following activities: Preparing to see the patient, Obtaining and/or reviewing separately obtained history, Performing a medically appropriate examination and/or evaluation, Counseling and educating the patient/family/caregiver, Ordering medications, tests, or procedures, Referring and communication with other health care professionals (when not separately reported), Documenting clinical information in the electronic or other health record, and Independently interpreting results (not separately reported) and communicating results to the patient/family/caregiver      Thank you for the opportunity to participate in this pleasant patient's care. Please do not hesitate to contact me with any questions/concerns.    Satira Anis APRN    Collaborating MD:HMS    Current Medications (including today's revisions)   acetaminophen (TYLENOL EXTRA STRENGTH) 500 mg tablet Take one tablet by mouth every 4 hours as needed for Pain. Max of 4,000 mg of acetaminophen in 24 hours.    allopurinoL (ZYLOPRIM) 100 mg tablet Take one-half tablet by mouth daily. Indications: treatment to prevent acute gout attack (Patient taking differently: Take one tablet by mouth twice daily. Indications: treatment to prevent acute gout attack)    aluminum-magnesium hydroxide-simethicone (MAALOX MAXIMUM STRENGTH) 400-400-40 mg/5 mL suspension Take 15 mL by mouth every 6 hours as needed.    apixaban (ELIQUIS) 5 mg tablet Take one tablet by mouth twice daily.    aspirin 81 mg chewable tablet Chew one tablet by mouth daily. Take with food.    atorvastatin (LIPITOR) 10 mg tablet Take one tablet by mouth daily.    divalproex (DEPAKOTE EC) 250 mg DR tablet Take three tablets by mouth at bedtime daily. Take with food.    docusate (COLACE) 100 mg capsule Take one capsule by mouth twice daily as needed.    empagliflozin (JARDIANCE) 10 mg tablet Take one tablet by mouth daily.    levothyroxine (SYNTHROID) 75 mcg tablet Take one tablet by mouth daily 30 minutes before breakfast.    magnesium hydroxide 1,200 mg chew Chew 30 mL by mouth daily as needed for Heartburn.    metOLazone (ZAROXOLYN) 5 mg tablet Take one tablet by mouth every 7 days. Take on Fridays.    metoprolol succinate XL (TOPROL XL) 100 mg extended release tablet TAKE 1 TABLET BY MOUTH EVERY DAY    oxyCODONE-acetaminophen (PERCOCET) 5-325 mg tablet Take one tablet by mouth every 6 hours as needed for Pain (may take up to two tablets).    potassium chloride SR (KLOR-CON M20) 20 mEq tablet Take two tablets by mouth twice daily. Take with a meal and a full glass of water. Take extra 40 mEq on Friday's with metolazone.    sertraline (ZOLOFT) 50 mg tablet Take one tablet by mouth daily.    spironolactone (ALDACTONE) 25 mg tablet TAKE ONE TABLET BY MOUTH DAILY. TAKE WITH FOOD.    tamsulosin (FLOMAX) 0.4 mg capsule Take one capsule by mouth daily. Do not crush, chew or open capsules. Take 30 minutes following the same meal each day.    torsemide (DEMADEX) 20 mg tablet Take three tablets by mouth daily.

## 2022-11-18 ENCOUNTER — Encounter
Admit: 2022-11-18 | Discharge: 2022-11-18 | Payer: 59 | Primary: Student in an Organized Health Care Education/Training Program

## 2022-11-18 DIAGNOSIS — I5040 Unspecified combined systolic (congestive) and diastolic (congestive) heart failure: Secondary | ICD-10-CM

## 2022-11-18 MED ORDER — POTASSIUM CHLORIDE 20 MEQ PO TBTQ
40 meq | ORAL_TABLET | Freq: Every day | ORAL | 3 refills | 30.00000 days | Status: AC
Start: 2022-11-18 — End: ?

## 2022-11-18 MED ORDER — POTASSIUM CHLORIDE 20 MEQ PO TBTQ
40 meq | Freq: Every day | ORAL | 0 refills | 30.00000 days | Status: DC
Start: 2022-11-18 — End: 2022-11-18

## 2022-11-18 MED ORDER — TORSEMIDE 20 MG PO TAB
40 mg | ORAL_TABLET | Freq: Every day | ORAL | 3 refills | 67.50000 days | Status: AC
Start: 2022-11-18 — End: ?

## 2022-11-18 MED ORDER — METOLAZONE 5 MG PO TAB
2.5 mg | ORAL_TABLET | ORAL | 1 refills | 84.00000 days | Status: AC
Start: 2022-11-18 — End: ?

## 2022-11-18 MED ORDER — TORSEMIDE 20 MG PO TAB
40 mg | Freq: Every day | ORAL | 0 refills | 67.50000 days | Status: DC
Start: 2022-11-18 — End: 2022-11-18

## 2022-11-18 MED ORDER — METOLAZONE 5 MG PO TAB
2.5 mg | ORAL | 0 refills | 84.00000 days | Status: DC
Start: 2022-11-18 — End: 2022-11-18

## 2022-11-18 NOTE — Telephone Encounter
Called and spoke to Eyvonne Left RN to discuss updated plan of care. Meds and labs faxed to facility.

## 2022-11-18 NOTE — Telephone Encounter
Per JC: Please ask his facility to decrease torsemide down to 40 mg daily, decrease KDur down to 40 mEq daily, and decrease metolazone to 2.5 mg on Fridays with additional 20 mEq of Kdur.

## 2022-11-25 ENCOUNTER — Encounter
Admit: 2022-11-25 | Discharge: 2022-11-25 | Payer: 59 | Primary: Student in an Organized Health Care Education/Training Program

## 2022-11-25 DIAGNOSIS — I5040 Unspecified combined systolic (congestive) and diastolic (congestive) heart failure: Secondary | ICD-10-CM

## 2022-11-25 LAB — BASIC METABOLIC PANEL
ANION GAP: 12 (ref 8–16)
BLD UREA NITROGEN: 35 mg/dL — AB (ref 8.4–25.7)
CALCIUM: 9.3 mg/dL (ref 8.8–10.0)
CHLORIDE: 99 mmol/L (ref 98–107)
CO2: 24 mmol/L (ref 23–31)
CREATININE: 1.7 mg/dL — AB (ref 0.72–1.25)
GLUCOSE,PANEL: 183 mg/dL — AB (ref 70–105)
POTASSIUM: 4.9 mmol/L (ref 3.5–5.1)
SODIUM: 135 mmol/L — AB (ref 136–145)

## 2022-11-26 ENCOUNTER — Encounter
Admit: 2022-11-26 | Discharge: 2022-11-26 | Payer: 59 | Primary: Student in an Organized Health Care Education/Training Program

## 2022-11-26 DIAGNOSIS — N1831 Stage 3a chronic kidney disease (HCC): Secondary | ICD-10-CM

## 2022-11-26 LAB — BASIC METABOLIC PANEL
ANION GAP: 12 (ref 8–16)
BLD UREA NITROGEN: 35 — ABNORMAL HIGH (ref 8.4–25.7)
BUN/CREATININE RATIO: 20 (ref 10–20)
CALCIUM: 9.3 (ref 8.8–10.0)
CHLORIDE: 99 (ref 98–107)
CO2: 24 (ref 23–31)
CREATININE: 1.7 — ABNORMAL HIGH (ref 0.72–1.25)
GFR ESTIMATED: 40 (ref >59)
GLUCOSE,PANEL: 183 — ABNORMAL HIGH (ref 70–105)
POTASSIUM: 4.9 (ref 3.5–5.1)
SODIUM: 135 — ABNORMAL LOW (ref 136–145)

## 2022-11-28 ENCOUNTER — Encounter
Admit: 2022-11-28 | Discharge: 2022-11-28 | Payer: 59 | Primary: Student in an Organized Health Care Education/Training Program

## 2022-11-28 NOTE — Progress Notes
Julien Girt D, APRN-NP  P [facility] Nurse H[facility] Team [facility]  Please let him know his labs look much better. He was too dry on the diuretics; I think cutting his dose back has helped a lot.  Thanks  JC

## 2022-11-28 NOTE — Progress Notes
Called and spoke with patient on authorized line using two patient identifiers.  Informed patient per Julien Girt APP that labs look much better, and there will be no changes to his plan of care. Patient verbalized understanding.

## 2023-01-06 ENCOUNTER — Encounter
Admit: 2023-01-06 | Discharge: 2023-01-06 | Payer: 59 | Primary: Student in an Organized Health Care Education/Training Program

## 2023-01-06 NOTE — Telephone Encounter
Spoke with pts nurse at [facility] ph) [phone] fax) [phone]. Pts weight has been trending up and has worsening swelling. Pt has labs done yesterday. Labs and vitals being faxed to [facility] Fax [phone]     Patient Status  patient is an established patient with [facility].        Signs and Symptoms  CXR last week at facility   Worsening leg swelling   "legs very tight"          Medication Review  Torsemide 40 mg daily  Bumex 2.5 mg on Fridays         Date Weight B/P Pulse   4/16 300      299           4/11 296

## 2023-01-07 ENCOUNTER — Encounter
Admit: 2023-01-07 | Discharge: 2023-01-07 | Payer: 59 | Primary: Student in an Organized Health Care Education/Training Program

## 2023-01-07 NOTE — Progress Notes
To: [facility]      Fax: [phone]      RE: Jacob Dickerson  DOB: May 16, 1946  MRN: [id number]    Medical Records request for continuity of care.  Jacob Dickerson is scheduled for an appointment in the Advanced Heart Failure clinic at [facility].    Please fax records to [phone]   [facility] [place] [facility]  [facility] - Advanced Heart Failure/Heart Transplant/VAD Clinic  [address]   [place] [facility]  [place], [place] [address]  Ph. [phone] Fax. [phone]    Requested Records: ED discharge summary and any lab testing done        CONFIDENTIALITY NOTICE  This facsimile transmission contains confidential information, some or all of which may be protected health information as defined by the federal Health Insurance Portability & Accountability Act (HIPAA) Privacy Rule.  The information contained in this facsimile is intended only for the exclusive and confidential use of the designated recipient named above.  If you are not the designated recipient (or an employee or agent responsible for delivering this facsimile transmission to the designated recipient), you are hereby notified that any disclosure, dissemination, distribution or copying of this information is strictly prohibited and may be subject to legal restriction or sanction. If you have received this transmission in error, immediately notify the Privacy Officer at [phone] to arrange for the return or destruction of the information and all copies.    [address] [place], [place] [address] - Phone [phone] - Fax [phone] - [email or web address]

## 2023-01-08 ENCOUNTER — Encounter
Admit: 2023-01-08 | Discharge: 2023-01-08 | Payer: 59 | Primary: Student in an Organized Health Care Education/Training Program

## 2023-01-16 ENCOUNTER — Encounter
Admit: 2023-01-16 | Discharge: 2023-01-16 | Payer: 59 | Primary: Student in an Organized Health Care Education/Training Program

## 2023-01-16 DIAGNOSIS — I5040 Unspecified combined systolic (congestive) and diastolic (congestive) heart failure: Secondary | ICD-10-CM

## 2023-01-16 LAB — BASIC METABOLIC PANEL
ANION GAP: 10
BLD UREA NITROGEN: 45 — ABNORMAL HIGH (ref 8.4–25.7)
BUN/CREATININE RATIO: 22 — ABNORMAL HIGH (ref 10–20)
CALCIUM: 9.3
CHLORIDE: 98
CO2: 30
CREATININE: 2 — ABNORMAL HIGH (ref 0.72–1.25)
GFR ESTIMATED: 33
GLUCOSE,PANEL: 99
POTASSIUM: 4.9
SODIUM: 138

## 2023-02-01 ENCOUNTER — Encounter
Admit: 2023-02-01 | Discharge: 2023-02-01 | Payer: 59 | Primary: Student in an Organized Health Care Education/Training Program

## 2023-02-01 DIAGNOSIS — E785 Hyperlipidemia, unspecified: Secondary | ICD-10-CM

## 2023-02-01 MED ORDER — ATORVASTATIN 10 MG PO TAB
10 mg | ORAL_TABLET | Freq: Every day | ORAL | 4 refills
Start: 2023-02-01 — End: ?

## 2023-02-22 ENCOUNTER — Encounter
Admit: 2023-02-22 | Discharge: 2023-02-22 | Payer: 59 | Primary: Student in an Organized Health Care Education/Training Program

## 2023-02-22 DIAGNOSIS — I4819 Other persistent atrial fibrillation: Secondary | ICD-10-CM

## 2023-02-22 MED ORDER — METOPROLOL SUCCINATE 100 MG PO TB24
100 mg | ORAL_TABLET | Freq: Every day | ORAL | 0 refills
Start: 2023-02-22 — End: ?

## 2023-03-03 ENCOUNTER — Encounter
Admit: 2023-03-03 | Discharge: 2023-03-03 | Payer: 59 | Primary: Student in an Organized Health Care Education/Training Program

## 2023-03-20 ENCOUNTER — Ambulatory Visit
Admit: 2023-03-20 | Discharge: 2023-03-20 | Payer: 59 | Primary: Student in an Organized Health Care Education/Training Program

## 2023-03-20 ENCOUNTER — Encounter
Admit: 2023-03-20 | Discharge: 2023-03-20 | Payer: 59 | Primary: Student in an Organized Health Care Education/Training Program

## 2023-03-20 ENCOUNTER — Inpatient Hospital Stay: Admit: 2023-03-20 | Payer: 59

## 2023-03-20 DIAGNOSIS — I4891 Unspecified atrial fibrillation: Secondary | ICD-10-CM

## 2023-03-20 DIAGNOSIS — M109 Gout, unspecified: Secondary | ICD-10-CM

## 2023-03-20 DIAGNOSIS — E669 Obesity, unspecified: Secondary | ICD-10-CM

## 2023-03-20 DIAGNOSIS — M179 Osteoarthritis of knee, unspecified: Secondary | ICD-10-CM

## 2023-03-20 DIAGNOSIS — E781 Pure hyperglyceridemia: Secondary | ICD-10-CM

## 2023-03-20 DIAGNOSIS — I5043 Acute on chronic combined systolic (congestive) and diastolic (congestive) heart failure: Secondary | ICD-10-CM

## 2023-03-20 DIAGNOSIS — N401 Enlarged prostate with lower urinary tract symptoms: Secondary | ICD-10-CM

## 2023-03-20 DIAGNOSIS — R011 Cardiac murmur, unspecified: Secondary | ICD-10-CM

## 2023-03-20 DIAGNOSIS — N1831 Stage 3a chronic kidney disease (HCC): Secondary | ICD-10-CM

## 2023-03-20 DIAGNOSIS — E785 Hyperlipidemia, unspecified: Secondary | ICD-10-CM

## 2023-03-20 DIAGNOSIS — I38 Endocarditis, valve unspecified: Secondary | ICD-10-CM

## 2023-03-20 DIAGNOSIS — I2510 Atherosclerotic heart disease of native coronary artery without angina pectoris: Secondary | ICD-10-CM

## 2023-03-20 DIAGNOSIS — I350 Nonrheumatic aortic (valve) stenosis: Secondary | ICD-10-CM

## 2023-03-20 DIAGNOSIS — I10 Essential (primary) hypertension: Secondary | ICD-10-CM

## 2023-03-20 DIAGNOSIS — Z720 Tobacco use: Secondary | ICD-10-CM

## 2023-03-20 LAB — BASIC METABOLIC PANEL
ANION GAP: 13 % — ABNORMAL HIGH (ref 3–12)
BLD UREA NITROGEN: 49 mg/dL — ABNORMAL HIGH (ref 7–25)
CALCIUM: 9.2 mg/dL (ref 8.5–10.6)
CHLORIDE: 99 MMOL/L (ref 98–110)
CO2: 28 MMOL/L (ref 21–30)
CREATININE: 1.8 mg/dL — ABNORMAL HIGH (ref 0.4–1.24)
EGFR: 38 mL/min — ABNORMAL LOW (ref >60)
GLUCOSE,PANEL: 106 mg/dL — ABNORMAL HIGH (ref 70–100)
POTASSIUM: 4.5 MMOL/L — ABNORMAL HIGH (ref 3.5–5.1)
SODIUM: 140 MMOL/L (ref 137–147)

## 2023-03-20 LAB — CBC AND DIFF
ABSOLUTE BASO COUNT: 0 K/UL (ref 0–0.20)
ABSOLUTE EOS COUNT: 0.1 K/UL (ref 0–0.45)
ABSOLUTE LYMPH COUNT: 1.1 K/UL (ref 1.0–4.8)
ABSOLUTE MONO COUNT: 0.8 K/UL — ABNORMAL HIGH (ref 0–0.80)
HEMATOCRIT: 43 % (ref 40–50)
MCH: 31 pg (ref 26–34)
MCV: 95 FL (ref 80–100)
RBC COUNT: 4.5 M/UL — ABNORMAL HIGH (ref 4.4–5.5)
WBC COUNT: 7.9 K/UL — ABNORMAL LOW (ref 4.5–11.0)

## 2023-03-20 LAB — MAGNESIUM: MAGNESIUM: 2.1 mg/dL (ref 1.6–2.6)

## 2023-03-20 LAB — IRON + BINDING CAPACITY + %SAT+ FERRITIN
IRON BINDING: 392 ug/dL — ABNORMAL HIGH (ref 270–380)
IRON: 69 ug/dL (ref 50–185)

## 2023-03-20 LAB — PARATHYROID HORMONE: PTH HORMONE: 119 pg/mL — ABNORMAL HIGH (ref 10–65)

## 2023-03-20 LAB — 25-OH VITAMIN D (D2 + D3): VITAMIN D (25-OH) TOTAL: 14 ng/mL — ABNORMAL LOW (ref 30–80)

## 2023-03-20 LAB — PHOSPHORUS: PHOSPHORUS: 2.8 mg/dL (ref 2.0–4.5)

## 2023-03-20 MED ORDER — GABAPENTIN 100 MG PO CAP
100 mg | Freq: Two times a day (BID) | ORAL | 0 refills | Status: SS
Start: 2023-03-20 — End: ?

## 2023-03-20 MED ORDER — TAMSULOSIN 0.4 MG PO CAP
.4 mg | Freq: Every day | ORAL | 0 refills | Status: AC
Start: 2023-03-20 — End: ?
  Administered 2023-03-21 – 2023-03-27 (×7): 0.4 mg via ORAL

## 2023-03-20 MED ORDER — APIXABAN 5 MG PO TAB
5 mg | Freq: Two times a day (BID) | ORAL | 0 refills | Status: AC
Start: 2023-03-20 — End: ?
  Administered 2023-03-21 – 2023-03-27 (×14): 5 mg via ORAL

## 2023-03-20 MED ORDER — SPIRONOLACTONE 25 MG PO TAB
25 mg | Freq: Every day | ORAL | 0 refills | Status: AC
Start: 2023-03-20 — End: ?
  Administered 2023-03-21 – 2023-03-27 (×7): 25 mg via ORAL

## 2023-03-20 MED ORDER — BUMETANIDE 0.25 MG/ML IJ SOLN
4 mg | Freq: Once | INTRAVENOUS | 0 refills | Status: CP
Start: 2023-03-20 — End: ?
  Administered 2023-03-20: 20:00:00 4 mg via INTRAVENOUS

## 2023-03-20 MED ORDER — ONDANSETRON 4 MG PO TBDI
4 mg | ORAL | 0 refills | Status: AC | PRN
Start: 2023-03-20 — End: ?

## 2023-03-20 MED ORDER — ASPIRIN 81 MG PO CHEW
81 mg | Freq: Every day | ORAL | 0 refills | Status: AC
Start: 2023-03-20 — End: ?
  Administered 2023-03-21 – 2023-03-27 (×7): 81 mg via ORAL

## 2023-03-20 MED ORDER — SENNOSIDES-DOCUSATE SODIUM 8.6-50 MG PO TAB
1 | Freq: Every day | ORAL | 0 refills | Status: AC | PRN
Start: 2023-03-20 — End: ?
  Administered 2023-03-22: 20:00:00 1 via ORAL

## 2023-03-20 MED ORDER — POLYETHYLENE GLYCOL 3350 17 GRAM PO PWPK
1 | Freq: Every day | ORAL | 0 refills | Status: AC | PRN
Start: 2023-03-20 — End: ?

## 2023-03-20 MED ORDER — GABAPENTIN 100 MG PO CAP
100 mg | Freq: Two times a day (BID) | ORAL | 0 refills | Status: AC
Start: 2023-03-20 — End: ?
  Administered 2023-03-21 – 2023-03-27 (×14): 100 mg via ORAL

## 2023-03-20 MED ORDER — LEVOTHYROXINE 75 MCG PO TAB
75 ug | Freq: Every day | ORAL | 0 refills | Status: AC
Start: 2023-03-20 — End: ?
  Administered 2023-03-21 – 2023-03-27 (×7): 75 ug via ORAL

## 2023-03-20 MED ORDER — SERTRALINE 50 MG PO TAB
50 mg | Freq: Every day | ORAL | 0 refills | Status: AC
Start: 2023-03-20 — End: ?
  Administered 2023-03-21 – 2023-03-27 (×7): 50 mg via ORAL

## 2023-03-20 MED ORDER — IMS MIXTURE TEMPLATE
750 mg | Freq: Every evening | ORAL | 0 refills | Status: AC
Start: 2023-03-20 — End: ?
  Administered 2023-03-21 – 2023-03-27 (×14): 750 mg via ORAL

## 2023-03-20 MED ORDER — METOPROLOL SUCCINATE 100 MG PO TB24
100 mg | Freq: Every day | ORAL | 0 refills | Status: AC
Start: 2023-03-20 — End: ?
  Administered 2023-03-21 – 2023-03-22 (×2): 100 mg via ORAL

## 2023-03-20 MED ORDER — ATORVASTATIN 10 MG PO TAB
10 mg | Freq: Every day | ORAL | 0 refills | Status: AC
Start: 2023-03-20 — End: ?
  Administered 2023-03-21 – 2023-03-27 (×7): 10 mg via ORAL

## 2023-03-20 MED ORDER — MELATONIN 5 MG PO TAB
5 mg | Freq: Every evening | ORAL | 0 refills | Status: AC | PRN
Start: 2023-03-20 — End: ?
  Administered 2023-03-22 – 2023-03-26 (×5): 5 mg via ORAL

## 2023-03-20 MED ORDER — ACETAMINOPHEN 500 MG PO TAB
500 mg | ORAL | 0 refills | Status: AC | PRN
Start: 2023-03-20 — End: ?
  Administered 2023-03-21 – 2023-03-27 (×13): 500 mg via ORAL

## 2023-03-20 MED ORDER — ERGOCALCIFEROL (VITAMIN D2) 1,250 MCG (50,000 UNIT) PO CAP
50000 [IU] | ORAL | 0 refills | Status: AC
Start: 2023-03-20 — End: ?
  Administered 2023-03-21: 13:00:00 50000 [IU] via ORAL

## 2023-03-20 MED ORDER — ALLOPURINOL 100 MG PO TAB
100 mg | Freq: Two times a day (BID) | ORAL | 0 refills | Status: AC
Start: 2023-03-20 — End: ?
  Administered 2023-03-21 – 2023-03-27 (×14): 100 mg via ORAL

## 2023-03-20 MED ORDER — ONDANSETRON HCL (PF) 4 MG/2 ML IJ SOLN
4 mg | INTRAVENOUS | 0 refills | Status: AC | PRN
Start: 2023-03-20 — End: ?

## 2023-03-20 MED ORDER — DAPAGLIFLOZIN PROPANEDIOL 10 MG PO TAB
10 mg | Freq: Every day | ORAL | 0 refills | Status: AC
Start: 2023-03-20 — End: ?
  Administered 2023-03-21 – 2023-03-27 (×7): 10 mg via ORAL

## 2023-03-20 NOTE — Progress Notes
Heart Failure Clinic Visit    Patient Name: Jacob, Dickerson   MRN: [id number]  DOB: 01/08/1946   Primary Cardiologist: Dr. Alger Simons   Date of Service: 03/20/2023     HPI       Jacob Dickerson was seen today in HF clinic for routine follow-up visit.  He was last seen in cardiology clinic by myself on 11/17/22. During that visit overall he was doing okay, was trying to work on weight loss as his weight had elevated slightly from 292 to 94 pounds.  After his labs were reviewed, his torsemide was decreased slightly to accommodate for renal function.  Since our last visit his torsemide has been increased back to up to 40 mg in a.m., 20 mg in p.m.  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    Today Jacob Dickerson reports since our last visit he has continued to gain weight.  He is up approximately 10 pounds over the last month, over the last 6 months up over 20 pounds.  He is short of breath during conversation, denies having chest pain or pressures pain.  No palpitations.  He does have significant abdominal distention, denies lower extremity edema.  No dizziness or lightheadedness.  He sleeps okay, denies PND.  On his home scale on 02/23/2023 his weight was 295 pounds, today on his home scale weight was 305 pounds.  Home SBP averaging 111-129 mmHg, heart rate averaging 68-94 bpm.  Although he tries to adhere to low-sodium diet he does live in a facility and has limited diet options.    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          Vitals:    03/20/23 1046   BP: 108/56   BP Source: Arm, Right Upper   Pulse: 100   SpO2: 93%   O2 Device: None (Room air)   PainSc: Zero   Weight: (!) 139.5 kg (307 lb 9.6 oz)   Height: 165.1 cm (5' 5)       Body mass index is 51.19 kg/m?Marland Kitchen      Physical Exam   General Appearance: obese male in no acute distress  Neck Veins: difficult to assess r/t body habitus   Respiratory Effort: breathing comfortably, no respiratory distress   Auscultation: Short of breath during conversation, lungs with decreased breath sounds to BLL, clear to BUL lungs clear to auscultation, no rales, rhonchi or wheezing   Cardiac Rhythm: regular rhythm and normal rate   Cardiac Auscultation: S1, S2 normal, no definite S3/S4   Murmurs: no murmur    Peripheral Circulation: normal peripheral circulation   Lower Extremity Edema: no edema noted   Abdominal Exam: obese, firm, distended  Gait & Station: using Rolator walker to ambulate   Orientation: oriented to person, place and time   Affect & Mood: appropriate and sustained affect   Language and Memory: patient responsive and seems to comprehend information   Neurologic Exam: neurological assessment grossly intact   Vital signs reviewed    Cardiovascular Studies    09/19/22 Echo:    Technically difficult study.    The left ventricular size is normal. Wall thickness is increased. Mild concentric hypertrophy. The left ventricular systolic function is moderately reduced. The visually estimated ejection fraction is  40%. Abnormal septal motion consistent with left bundle branch block.    The right ventricular size is normal. The right ventricular systolic function is normal.    Left Atrium: Mildly dilated. Right Atrium: Moderately dilated.    Aortic Valve: There is a 26 mm Edwards SAPIEN bioprosthetic valve present. The prosthetic valve is normal. No stenosis.  MG = 7 mmHg, DVI = 0.5.  No regurgitation.    The ascending aorta is moderately dilated.    No pericardial effusion.    Compared with study dated 01/29/2022, no significant change is noted.    02/28/22 RHC:  RA pressure 4 mmHg with a V-wave of 7 mmHg.  RV pressure 23/2 mmHg with an RVEDP of 4 mmHg.  PA pressure 23/13 mmHg with a mean PA pressure of 16 mmHg.  Pulmonary capillary wedge pressure of 5 mmHg with a V-wave of 6 mmHg.  Transpulmonary gradient of 11 mmHg.  PA sat 65%, RA sat 65%, pulse ox 94%.  Blood pressure 98/66 mmHg and heart rate 77 beats per minute.  Body surface area 2.27 and hemoglobin 15.9.  Cardiac output by Fick 4.52 L/minute and by thermo 3.4 L/minute.  Cardiac index by Fick 2.0 L/minute/m2 and by thermo 1.5 L/minutes/m2.  PVR by Fick was 2.4.  PVR by thermo was 3.2.    07/29/21 Right and Left heart cath:    HEMODYNAMICS OF RIGHT HEART CATHETERIZATION:    Body surface area 2.20 m2.  Hemoglobin percent is 12 grams/dL.  Blood pressure 123/58 mmHg with a mean of 83 mmHg.  Heart rate 75 beats per minute.     SATURATIONS:    Aorta saturation 99%.  Right atrial saturation 66%.  Pulmonary artery saturation 68%.     PRESSURES:    Right atrial pressure 8 mmHg.  Right ventricular pressure 26/8 mmHg.  Pulmonary artery pressure 31/13 mmHg with a mean of 19 mmHg.  Pulmonary capillary wedge pressure of 13 mmHg.  Transpulmonary gradient of 6 mmHg.  Diastolic pulmonary gradient of 0 mmHg.  Cardiac output by Fick method was 5.47 L/minute and by thermodilution technique was 4.75 L/minute.  Cardiac index by Fick method was 2.47 L/minute per sq m and by thermodilution technique was 2.16 L/minute per sq m.  Pulmonary vascular resistance was 1.09 Woods unit.  Systemic vascular resistance was 1097 dynes.second.centimeter 5.     HEMODYNAMICS OF LEFT HEART CATHETERIZATION:  The left ventricular systolic pressure was 106 mmHg.  The aortic pressure was 75/33 mmHg.  The gradient across the aortic valve was 31 mmHg, which was suggestive of moderate aortic stenosis.  The left ventricular end-diastolic pressure was 12 mmHg.     SELECTIVE CORONARY ANGIOGRAPHY:    Left main coronary artery:  The left main coronary artery arises normally from the left coronary sinus.  The left main artery bifurcates into the left anterior descending artery and left circumflex artery.  The left main coronary artery is free of angiographically significant disease.  Left anterior descending artery:  The left anterior descending artery is a large caliber vessel.  The mid LAD has a focal area of 30% stenosis after the 1st diagonal branch.  This is a type 3 LAD.  It gives rise to a medium caliber diagonal branch which gives rise to a superior inferior branch which is free of angiographically significant disease.  Left circumflex artery:  The left circumflex artery is a large caliber vessel which gives rise to a high 1st obtuse marginal branch followed by two additional obtuse marginal  branches.  The left circumflex artery as well as the branches are free of angiographically significant disease.  Right coronary artery:  The right coronary artery is a large caliber dominant vessel which arises normally from the right coronary sinus.  The right coronary artery distally bifurcates into the right posterior descending artery and right posterolateral branch.  The right coronary as well as its branches are free of angiographically significant disease.    FINAL IMPRESSION:  Normal right and left-sided filling pressures.  Normal cardiac output and cardiac index by Fick method.  The gradient across the aortic valve was 31 mmHg, which is suggestive of moderate aortic stenosis.  Mild nonobstructive coronary artery disease as described above.    The left ventricular end-diastolic pressure was 12 mmHg.    07/17/21 PYP scan:  This study is normal and not suggestive of ATTR cardiac amyloidosis.This study does not exclude AL Amyloidosis.  If the clinical suspicion of amyloidosis is intermediate to high consider serum free light chain and serum and urine immunofixation.      Cardiovascular Health Factors  Vitals BP Readings from Last 3 Encounters:   03/20/23 108/56   11/17/22 102/50   09/25/22 102/56     Wt Readings from Last 3 Encounters:   03/20/23 (!) 139.5 kg (307 lb 9.6 oz)   11/17/22 134.4 kg (296 lb 3.2 oz)   09/25/22 132.5 kg (292 lb)     BMI Readings from Last 3 Encounters:   03/20/23 51.19 kg/m?   11/17/22 49.29 kg/m?   09/25/22 48.59 kg/m?      Smoking Social History     Tobacco Use   Smoking Status Former    Current packs/day: 0.00    Average packs/day: 0.3 packs/day for 30.0 years (7.5 ttl pk-yrs)    Types: Cigarettes    Start date: 05/12/1979    Quit date: 05/11/2009    Years since quitting: 13.8   Smokeless Tobacco Never   Tobacco Comments    quit 2008      Lipid Profile Cholesterol   Date Value Ref Range Status   08/30/2021 143  Final     HDL   Date Value Ref Range Status   08/30/2021 26 (L) >=40 Final     LDL   Date Value Ref Range Status   08/30/2021 71  Final     Triglycerides   Date Value Ref Range Status   08/30/2021 232 (H) <150 Final      Blood Sugar Hemoglobin A1C   Date Value Ref Range Status   02/21/2022 6.0 (H) 4.0 - 5.7 % Final     Comment:     The ADA recommends that most patients with type 1 and type 2 diabetes maintain   an A1c level <7%.       Glucose   Date Value Ref Range Status   01/16/2023 99  Final   01/07/2023 139 (H) 70 - 105 Final   01/06/2023 143 (H) 70 - 105 Final   08/27/2011 88 65 - 99 mg/dL Final     Comment:                   Fasting reference interval      04/17/2006 89 70 - 110 MG/DL Final   16/06/9603 86 70 - 110 MG/DL Final     Glucose, POC   Date Value Ref Range Status   01/02/2008 117 (H) 70 - 110 MG/DL Final   54/05/8118 147 (H) 70 - 110 MG/DL Final  01/01/2008 155 (H) 70 - 110 MG/DL Final           Problems Addressed Today  Encounter Diagnoses   Name Primary?    Atrial fibrillation, unspecified type (HCC) Yes    Coronary artery disease involving native coronary artery of native heart without angina pectoris     Primary hypertension     Acute on chronic combined systolic and diastolic heart failure (HCC)             Assessment/Plan:    Acute on chronic HFrEF due to NICM   Jacob Dickerson today is describing NYHA Functional Class IIIb and AHA stage C symptoms.  Our med list indicates he is taking torsemide 40 mg in a.m., 20 mg in p.m. along with K-Dur 40 mEq BID (although his facility list shows med list has been taking Torsemide 20 mg daily).  He also has metolazone 2.5 mg every Friday with an additional 20 mEq of potassium with the metolazone.  Today on exam he is significantly short of breath, with abdominal distention, 10 pound weight gain over the last month.  Chart review shows his weight has increased over 20 pounds since 04/2022.  Discussed with him he would benefit from hospitalization for IV diuresis, he is agreeable.    Will plan to obtain labs prior to having patient admitted so that inpatient team can review.  Recommend starting with IV Bumex 4 mg BID, may need to consider augmenting with Diuril/metolazone if he does not diurese appropriately.    Estimated dry weight is 280-285 lbs.       GDMT Current Dose Changes made at visit   BB Toprol XL 100 mg daily No changes    ACEI/ARB/ARNI N/A    SGLT-2 Inhibitor Jardiance 10 mg daily No changes    Aldosterone Antagonist Spironolactone 25 mg daily   No changes    Hydralazine/Nitrate N/A    Ivabradine N/A      Non-obstructive CAD    Most recent ischemic evaluation was coronary angiogram on 07/29/21 which revealed mild nonobstructive CAD, no interventions. He denies having any acute coronary symptoms today. Continue on Asa 81 mg daily and Atorvastatin 10 mg daily.     Aortic Regurgitation s/p SAVR (2009) with repeat TAVR (2022)  Echo on 01/29/22 noted the aortic valve was not well seen, 26 mm Edwards SAPIEN bioprosthetic valve was present, with normal function, no stenosis/regurgitation, mean gradient 7 mmHg.    HTN  Blood pressure today in clinic is 108/56. Continue on Toprol XL 100 mg daily per table above.     AFib  Today on exam heart rate is regular rate and rhythm. Currently anti-coagulated with Eliquis 5 mg BID and taking Toprol XL for rate control.  Denies having missed doses, no s/s of bleeding reported.       CKD IIIb  His creatinine ranges 1.7-2.2. He has established with nephrology, currently scheduled on 03/23/23.  Anticipate he may need to be seen during his admission by nephrology team. Will plan to check routine labs today prior to admission.     Dyslipidemia   Currently receiving Atorvastatin 10 mg po daily.  Denies myalgias. Lipid profile as noted below. Will need to repeat Fasting Lipid panel in the upcoming appointments.   Lab Results   Component Value Date    CHOL 143 08/30/2021    TRIG 232 (H) 08/30/2021    HDL 26 (L) 08/30/2021    LDL 71 08/30/2021    VLDL 46 (H) 08/30/2021  NONHDLCHOL 95 07/17/2021    CHOLHDLC 6 (H) 08/30/2021     Obesity  Today's weight is 307 lbs with Body mass index is 51.19 kg/m?Marland Kitchen Encouraged weight loss, increasing activity, and cardiac diet. He would benefit from consideration of GLP-1 agonists however has previously declined to try Ozempic.     Follow up  Plan for hospitalization today for acute decompensated heart failure, anticipate he will need aggressive diuresis.  After he has been discharged from the hospital, will plan to have him return to HF clinic for close follow-up visit.  Jacob Dickerson is in agreement, states understanding this plan.         Total Time Today was 45 minutes in the following activities: Preparing to see the patient, Obtaining and/or reviewing separately obtained history, Performing a medically appropriate examination and/or evaluation, Counseling and educating the patient/family/caregiver, Ordering medications, tests, or procedures, Referring and communication with other health care professionals (when not separately reported), Documenting clinical information in the electronic or other health record, Independently interpreting results (not separately reported) and communicating results to the patient/family/caregiver, and Care coordination (not separately reported)      Thank you for the opportunity to participate in this pleasant patient's care. Please do not hesitate to contact me with any questions/concerns.    Satira Anis APRN    Collaborating MD:HMS    Current Medications (including today's revisions)   acetaminophen (TYLENOL EXTRA STRENGTH) 500 mg tablet Take one tablet by mouth every 4 hours as needed for Pain. Max of 4,000 mg of acetaminophen in 24 hours.    allopurinoL (ZYLOPRIM) 100 mg tablet Take one-half tablet by mouth daily. Indications: treatment to prevent acute gout attack (Patient taking differently: Take one tablet by mouth twice daily. Indications: treatment to prevent acute gout attack)    aluminum-magnesium hydroxide-simethicone (MAALOX MAXIMUM STRENGTH) 400-400-40 mg/5 mL suspension Take 15 mL by mouth every 6 hours as needed.    apixaban (ELIQUIS) 5 mg tablet Take one tablet by mouth twice daily.    aspirin 81 mg chewable tablet Chew one tablet by mouth daily. Take with food.    atorvastatin (LIPITOR) 10 mg tablet TAKE ONE TABLET BY MOUTH EVERY DAY    divalproex (DEPAKOTE EC) 250 mg DR tablet Take three tablets by mouth at bedtime daily. Take with food.    docusate (COLACE) 100 mg capsule Take one capsule by mouth twice daily as needed.    empagliflozin (JARDIANCE) 10 mg tablet Take one tablet by mouth daily.    levothyroxine (SYNTHROID) 75 mcg tablet Take one tablet by mouth daily 30 minutes before breakfast.    magnesium hydroxide 1,200 mg chew Chew 30 mL by mouth daily as needed for Heartburn.    metOLazone (ZAROXOLYN) 5 mg tablet Take one-half tablet by mouth every Friday. Take on Fridays.    metoprolol succinate XL (TOPROL XL) 100 mg extended release tablet TAKE ONE TABLET BY MOUTH EVERY DAY    oxyCODONE-acetaminophen (PERCOCET) 5-325 mg tablet Take one tablet by mouth every 6 hours as needed for Pain (may take up to two tablets).    potassium chloride SR (KLOR-CON M20) 20 mEq tablet Take two tablets by mouth twice daily. Take with a meal and a full glass of water. Take extra 20 mEq on Friday's with metolazone.    sertraline (ZOLOFT) 50 mg tablet Take one tablet by mouth daily.    spironolactone (ALDACTONE) 25 mg tablet TAKE ONE TABLET BY MOUTH DAILY. TAKE WITH FOOD.    tamsulosin (FLOMAX) 0.4 mg  capsule Take one capsule by mouth daily. Do not crush, chew or open capsules. Take 30 minutes following the same meal each day.    torsemide (DEMADEX) 20 mg tablet 40 mg (two tablets) in the AM, 20 mg (one tablet) in the PM.

## 2023-03-20 NOTE — Patient Instructions
Thank you for coming to [facility]. Your instructions today:    Medications:    Labs:    Test:    Follow up appointment:- Admit to Medicine       Please call the office with any questions or concerns [phone] (nurse triage). Voicemail is available during regular business hours, calls before 3:30 pm should receive a call back the same day. After 3:30 pm and on weekends for emergencies you can reach the ON CALL provider at [phone]       To schedule or change an appointment call [phone].      If you are being seen in the Heart Failure clinic:   Follow a 2000 mg sodium diet and 2-liter fluid restriction.    Call for any worsening symptoms of shortness of breath, swelling, sudden weight gain, lightheadedness, heart racing, or chest pain.  Weigh yourself daily and call us if your weight increase is greater than 3 pounds overnight OR 5 pounds in one week.   It is helpful for you to bring your list of current medications with you to all clinic visit.     It was a pleasure to see you in clinic today. Thank you for allowing [place] to be a part of your health care.       Advanced [facility] - Silver Team  Physicians:  Bing Matter, MD  Nurse Practitioners: Satira Anis APRN, Fara Boros APRN, Abby Hillery Hunter APRN, and Gates Rigg APRN  RNs: Mick Sell RN, [facility] RN, Leafy Ro RN, Vivien Rota RN and [place] Poore RN    At the [facility], you and your family are our top priority.  You may receive a survey via email or text message that we are asking you to complete.  We value your feedback to ensure you are satisfied with every visit and we are continually providing the highest quality of patient care.  We know your time is valuable and thank you in advance for completing the survey.    Lab and test results:  As a part of the CARES act, starting 12/22/2019, some results will be released to you via mychart immediately and automatically.  You may see results before your provider sees them; however, your provider will review all these results and then they, or one of their team, will notify you of result information and recommendations.   Critical results will be addressed immediately, but otherwise, please allow [place] time to get back with you prior to you reaching out to [place] for questions.  This will usually take about 72 hours for labs and 5-7 days for procedure test results.          HEART FAILURE EDUCATION VIDEO PLAYLIST     These videos have been chosen for you to give you more information on heart failure.  WHAT IS HEART FAILURE? Learn general info about heart failure including tests, treatments, and medicine.  LIFESTYLE- diet, fluids, salt intake, and medications  AT HOME- how to prepare for appointments, and everyday tips.     Scan the QR code or use the link to access. No registration or password needed.  Open the camera app on your smart phone and point the camera over the QR Code.  Click on the link that pops or copy and paste the link into your [employer].    [email or web address]
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
## 2023-03-20 NOTE — Progress Notes
RT Adult Assessment Note    NAME:Jacob Dickerson             MRN: [id number]             DOB:March 07, 1946          AGE: 77 y.o.  ADMISSION DATE: 03/20/2023             DAYS ADMITTED: LOS: 0 days    Additional Comments:  Impressions of the patient: lying in bed; nad  Intervention(s)/outcome(s): Does not meet RT criteria; IS ordered  Patient education that was completed: na  Recommendations to the care team: na    Vital Signs:  Pulse:    RR:    SpO2:    O2 Device:    Liter Flow:    O2%:      Breath Sounds:      Respiratory Effort:      Comments:

## 2023-03-21 MED ADMIN — WATER FOR INJECTION, STERILE IJ SOLN [79513]: 10 mL | INTRAVENOUS | @ 19:00:00 | Stop: 2023-03-21 | NDC 00409488717

## 2023-03-21 MED ADMIN — ARTIFICIAL TEARS (PF) SINGLE DOSE DROPS GROUP [280009]: 1 [drp] | OPHTHALMIC | @ 16:00:00 | NDC 00065806301

## 2023-03-21 MED ADMIN — ACETAZOLAMIDE SODIUM 500 MG IJ SOLR [114]: 500 mg | INTRAVENOUS | @ 19:00:00 | Stop: 2023-03-21 | NDC 23155031331

## 2023-03-22 ENCOUNTER — Encounter
Admit: 2023-03-22 | Discharge: 2023-03-22 | Payer: 59 | Primary: Student in an Organized Health Care Education/Training Program

## 2023-03-22 MED ADMIN — BUMETANIDE 0.25 MG/ML IJ SOLN [9308]: 4 mg | INTRAVENOUS | @ 13:00:00 | NDC 72205010101

## 2023-03-22 MED ADMIN — BUMETANIDE 0.25 MG/ML IJ SOLN [9308]: 4 mg | INTRAVENOUS | @ 22:00:00 | NDC 72205010101

## 2023-03-22 MED ADMIN — ARTIFICIAL TEARS (PF) SINGLE DOSE DROPS GROUP [280009]: 1 [drp] | OPHTHALMIC | @ 20:00:00 | NDC 00065806301

## 2023-03-23 MED ADMIN — METOPROLOL SUCCINATE 100 MG PO TB24 [78380]: 100 mg | ORAL | @ 13:00:00 | NDC 00904632461

## 2023-03-23 MED ADMIN — BUMETANIDE 0.25 MG/ML IJ SOLN [9308]: 4 mg | INTRAVENOUS | @ 22:00:00 | NDC 72205010101

## 2023-03-23 MED ADMIN — DICLOFENAC SODIUM 1 % TP GEL [168032]: 2 g | TOPICAL | @ 05:00:00 | NDC 45802095301

## 2023-03-23 MED ADMIN — BUMETANIDE 0.25 MG/ML IJ SOLN [9308]: 4 mg | INTRAVENOUS | @ 13:00:00 | NDC 72205010101

## 2023-03-24 MED ADMIN — WATER FOR INJECTION, STERILE IJ SOLN [79513]: 20 mL | INTRAVENOUS | @ 20:00:00 | Stop: 2023-03-24 | NDC 00409488723

## 2023-03-24 MED ADMIN — POTASSIUM CHLORIDE 20 MEQ PO TBTQ [35943]: 40 meq | ORAL | @ 15:00:00 | Stop: 2023-03-24 | NDC 00832532510

## 2023-03-24 MED ADMIN — METOPROLOL SUCCINATE 100 MG PO TB24 [78380]: 100 mg | ORAL | @ 15:00:00 | NDC 00904632461

## 2023-03-24 MED ADMIN — BUMETANIDE 0.25 MG/ML IJ SOLN [9308]: 4 mg | INTRAVENOUS | @ 15:00:00 | Stop: 2023-03-24 | NDC 72205010101

## 2023-03-24 MED ADMIN — CHLOROTHIAZIDE SODIUM 500 MG IV SOLR [81318]: 500 mg | INTRAVENOUS | @ 20:00:00 | Stop: 2023-03-24 | NDC 25021030520

## 2023-03-25 MED ADMIN — CALCIUM CARBONATE 200 MG CALCIUM (500 MG) PO CHEW [9385]: 500 mg | ORAL | @ 16:00:00 | NDC 66553000401

## 2023-03-25 MED ADMIN — METOPROLOL SUCCINATE 100 MG PO TB24 [78380]: 100 mg | ORAL | @ 14:00:00 | NDC 00904632461

## 2023-03-26 MED ADMIN — TORSEMIDE 20 MG PO TAB [18293]: 40 mg | ORAL | @ 14:00:00 | NDC 68084053911

## 2023-03-26 MED ADMIN — TORSEMIDE 20 MG PO TAB [18293]: 40 mg | ORAL | @ 22:00:00 | NDC 68084053911

## 2023-03-26 MED ADMIN — METOPROLOL SUCCINATE 100 MG PO TB24 [78380]: 100 mg | ORAL | @ 14:00:00 | NDC 00904632461

## 2023-03-27 MED ADMIN — TORSEMIDE 20 MG PO TAB [18293]: 40 mg | ORAL | @ 13:00:00 | Stop: 2023-03-27 | NDC 68084053911

## 2023-03-27 MED ADMIN — CALCIUM CARBONATE 200 MG CALCIUM (500 MG) PO CHEW [9385]: 500 mg | ORAL | @ 18:00:00 | Stop: 2023-03-27 | NDC 66553000401

## 2023-03-27 MED ADMIN — METOPROLOL SUCCINATE 100 MG PO TB24 [78380]: 100 mg | ORAL | @ 13:00:00 | Stop: 2023-03-27 | NDC 00904632461

## 2023-04-02 ENCOUNTER — Encounter
Admit: 2023-04-02 | Discharge: 2023-04-02 | Payer: 59 | Primary: Student in an Organized Health Care Education/Training Program

## 2023-04-02 NOTE — Telephone Encounter
Patient was admitted from 6/28--7/5 for HF exacerbation. He was unable to make his hospital follow up visit yesterday. At discharge torsemide was increased to 40 BID from 40 am/20 pm. Weight at discharge 7/5 was 294 lbs.     I spoke with Marcelino Duster, RN at his assisted living facility and she states that his weight this morning is 305 lbs, up 2 lbs from yesterday. He is taking torsemide 20 BID, potassium 40 bid daily (60 on Friday with metolazone), spironolactone 25 mg daily, Jardiance 10 mg, and metolazone 5 mg on Friday only. He denies SOA. She does state he was eating out earlier this week but has improved with fluids and sodium the last few days. He does have edema due to cellulitis in his RLE. LLE is  his usual edema.     BMP 7/4 BUN/creat 67/2.31 with sodium and K+ WNL.     Please advise

## 2023-04-02 NOTE — Telephone Encounter
Jacob Dickerson with [facility] assisted living reporting pts was discharged from [facility] on 7/5 and was weighed at facility at 295 lbs and is now 304.8 lbs. Jacob Dickerson asks for call back to [phone] to discuss plan for pt. Pt missed hospital follow up yesterday.

## 2023-04-06 ENCOUNTER — Encounter
Admit: 2023-04-06 | Discharge: 2023-04-06 | Payer: 59 | Primary: Student in an Organized Health Care Education/Training Program

## 2023-04-08 ENCOUNTER — Ambulatory Visit
Admit: 2023-04-08 | Discharge: 2023-04-08 | Payer: 59 | Primary: Student in an Organized Health Care Education/Training Program

## 2023-04-08 ENCOUNTER — Encounter
Admit: 2023-04-08 | Discharge: 2023-04-08 | Payer: 59 | Primary: Student in an Organized Health Care Education/Training Program

## 2023-04-08 DIAGNOSIS — E781 Pure hyperglyceridemia: Secondary | ICD-10-CM

## 2023-04-08 DIAGNOSIS — E782 Mixed hyperlipidemia: Secondary | ICD-10-CM

## 2023-04-08 DIAGNOSIS — I428 Other cardiomyopathies: Secondary | ICD-10-CM

## 2023-04-08 DIAGNOSIS — N1831 Stage 3a chronic kidney disease (HCC): Secondary | ICD-10-CM

## 2023-04-08 DIAGNOSIS — I4891 Unspecified atrial fibrillation: Secondary | ICD-10-CM

## 2023-04-08 DIAGNOSIS — I4821 Permanent atrial fibrillation: Secondary | ICD-10-CM

## 2023-04-08 DIAGNOSIS — I38 Endocarditis, valve unspecified: Secondary | ICD-10-CM

## 2023-04-08 DIAGNOSIS — R011 Cardiac murmur, unspecified: Secondary | ICD-10-CM

## 2023-04-08 DIAGNOSIS — M179 Osteoarthritis of knee, unspecified: Secondary | ICD-10-CM

## 2023-04-08 DIAGNOSIS — I2510 Atherosclerotic heart disease of native coronary artery without angina pectoris: Secondary | ICD-10-CM

## 2023-04-08 DIAGNOSIS — I5020 Unspecified systolic (congestive) heart failure: Secondary | ICD-10-CM

## 2023-04-08 DIAGNOSIS — N183 Stage 3 chronic kidney disease, unspecified whether stage 3a or 3b CKD (HCC): Secondary | ICD-10-CM

## 2023-04-08 DIAGNOSIS — Z720 Tobacco use: Secondary | ICD-10-CM

## 2023-04-08 DIAGNOSIS — I350 Nonrheumatic aortic (valve) stenosis: Secondary | ICD-10-CM

## 2023-04-08 DIAGNOSIS — M109 Gout, unspecified: Secondary | ICD-10-CM

## 2023-04-08 DIAGNOSIS — I10 Essential (primary) hypertension: Secondary | ICD-10-CM

## 2023-04-08 DIAGNOSIS — N401 Enlarged prostate with lower urinary tract symptoms: Secondary | ICD-10-CM

## 2023-04-08 DIAGNOSIS — E785 Hyperlipidemia, unspecified: Secondary | ICD-10-CM

## 2023-04-08 DIAGNOSIS — E669 Obesity, unspecified: Secondary | ICD-10-CM

## 2023-04-08 LAB — BASIC METABOLIC PANEL
ANION GAP: 12 (ref 3–12)
BLD UREA NITROGEN: 69 mg/dL — ABNORMAL HIGH (ref 7–25)
CALCIUM: 9.3 mg/dL (ref 8.5–10.6)
CHLORIDE: 92 MMOL/L — ABNORMAL LOW (ref 98–110)
CO2: 32 MMOL/L — ABNORMAL HIGH (ref 21–30)
CREATININE: 2.3 mg/dL — ABNORMAL HIGH (ref 0.4–1.24)
EGFR: 28 mL/min — ABNORMAL LOW (ref >60)
GLUCOSE,PANEL: 109 mg/dL — ABNORMAL HIGH (ref 70–100)
POTASSIUM: 4.3 MMOL/L (ref 3.5–5.1)
SODIUM: 136 MMOL/L — ABNORMAL LOW (ref 137–147)

## 2023-04-15 ENCOUNTER — Encounter
Admit: 2023-04-15 | Discharge: 2023-04-15 | Payer: 59 | Primary: Student in an Organized Health Care Education/Training Program

## 2023-04-16 ENCOUNTER — Encounter
Admit: 2023-04-16 | Discharge: 2023-04-16 | Payer: 59 | Primary: Student in an Organized Health Care Education/Training Program

## 2023-04-16 NOTE — Progress Notes
To: [facility]     Fax: [phone]       RE: Jacob Dickerson  DOB: 05-04-1946  MRN: [id number]    Medical Records request for continuity of care.  Jacob Dickerson is scheduled for an appointment in the Advanced Heart Failure clinic at [facility].    Please fax records to [phone]   [facility] [place] [facility]  [facility] - Advanced Heart Failure/Heart Transplant/VAD Clinic  [address]   [place]  [place], [place] [address]  Ph. [phone] Fax. [phone]    Requested Records:   Other: blood pressures, weights, heart rates from the past 7 days, plus medication list.           CONFIDENTIALITY NOTICE  This facsimile transmission contains confidential information, some or all of which may be protected health information as defined by the federal Health Insurance Portability & Accountability Act (HIPAA) Privacy Rule.  The information contained in this facsimile is intended only for the exclusive and confidential use of the designated recipient named above.  If you are not the designated recipient (or an employee or agent responsible for delivering this facsimile transmission to the designated recipient), you are hereby notified that any disclosure, dissemination, distribution or copying of this information is strictly prohibited and may be subject to legal restriction or sanction. If you have received this transmission in error, immediately notify the Privacy Officer at [phone] to arrange for the return or destruction of the information and all copies.    [address] - [address] [place], [place] [address] - Phone [phone] - Fax [phone] - [email or web address]

## 2023-04-30 ENCOUNTER — Encounter
Admit: 2023-04-30 | Discharge: 2023-04-30 | Payer: 59 | Primary: Student in an Organized Health Care Education/Training Program

## 2023-05-26 ENCOUNTER — Encounter: Admit: 2023-05-26 | Discharge: 2023-05-26 | Payer: 59

## 2023-05-26 DIAGNOSIS — I5040 Unspecified combined systolic (congestive) and diastolic (congestive) heart failure: Secondary | ICD-10-CM

## 2023-05-26 MED ORDER — POTASSIUM CHLORIDE 20 MEQ PO TBTQ
ORAL_TABLET | 3 refills
Start: 2023-05-26 — End: ?

## 2023-06-11 ENCOUNTER — Encounter: Admit: 2023-06-11 | Discharge: 2023-06-11 | Payer: 59

## 2023-06-11 DIAGNOSIS — I10 Essential (primary) hypertension: Secondary | ICD-10-CM

## 2023-06-11 DIAGNOSIS — I4821 Permanent atrial fibrillation: Secondary | ICD-10-CM

## 2023-06-11 DIAGNOSIS — E7849 Other hyperlipidemia: Secondary | ICD-10-CM

## 2023-06-18 ENCOUNTER — Encounter: Admit: 2023-06-18 | Discharge: 2023-06-18 | Payer: 59

## 2023-06-18 MED ORDER — JARDIANCE 10 MG PO TAB
10 mg | ORAL_TABLET | Freq: Every day | ORAL | 0 refills | Status: AC
Start: 2023-06-18 — End: ?

## 2023-06-23 ENCOUNTER — Encounter: Admit: 2023-06-23 | Discharge: 2023-06-23 | Payer: 59

## 2023-06-23 DIAGNOSIS — E785 Hyperlipidemia, unspecified: Secondary | ICD-10-CM

## 2023-06-23 DIAGNOSIS — R011 Cardiac murmur, unspecified: Secondary | ICD-10-CM

## 2023-06-23 DIAGNOSIS — M109 Gout, unspecified: Secondary | ICD-10-CM

## 2023-06-23 DIAGNOSIS — I38 Endocarditis, valve unspecified: Secondary | ICD-10-CM

## 2023-06-23 DIAGNOSIS — I10 Essential (primary) hypertension: Secondary | ICD-10-CM

## 2023-06-23 DIAGNOSIS — E781 Pure hyperglyceridemia: Secondary | ICD-10-CM

## 2023-06-23 DIAGNOSIS — Z720 Tobacco use: Secondary | ICD-10-CM

## 2023-06-23 DIAGNOSIS — M179 Osteoarthritis of knee, unspecified: Secondary | ICD-10-CM

## 2023-06-23 DIAGNOSIS — I350 Nonrheumatic aortic (valve) stenosis: Secondary | ICD-10-CM

## 2023-06-23 DIAGNOSIS — I4891 Unspecified atrial fibrillation: Secondary | ICD-10-CM

## 2023-06-23 DIAGNOSIS — N401 Enlarged prostate with lower urinary tract symptoms: Secondary | ICD-10-CM

## 2023-06-23 DIAGNOSIS — E669 Obesity, unspecified: Secondary | ICD-10-CM

## 2023-06-23 DIAGNOSIS — N1832 Stage 3b chronic kidney disease (HCC): Secondary | ICD-10-CM

## 2023-06-23 NOTE — Progress Notes
Date of Service: 06/23/2023    Jacob Dickerson is a 77 y.o. male.       Chief Complaint: Follow-up    History of Present Illness:     I had the pleasure of seeing Jacob Jacob Dickerson) [name] in our [place] office this afternoon for cardiovascular followup.      As you know, Jacob Dickerson is a truly delightful 77 year old gentleman with history of hypertension, dyslipidemia, obesity, stage 3 chronic kidney disease, permanent atrial fibrillation, mild nonobstructive coronary artery disease, bicuspid aortic valve with severe regurgitation S/P surgical aortic valve replacement with 27 mm ThermaFix pericardial aortic valve in 2009 and redo valve replacement with 26 mm Sapien valve TAVR in 2022, COPD, obesity, and heart failure with mild to moderately reduced systolic function (EF ~40%).     I saw Jacob Dickerson back in January.  He did have a hospitalization in June for decompensated heart failure.  His weight was up about 10 pounds from his dry weight of 290-295 pounds.  He got IV diuretics for couple of days and was discharged home.  He is done really well since then.  His weight has really been around 300 pounds since getting out of the hospital and has not changed much.  He is not having any issues with chest discomfort.  Shortness of breath is stable and only occurs when he is really exerting himself.  Continues to use a walker.  No lightheadedness, dizziness, palpitations, heart racing, orthopnea, paroxysmal nocturnal dyspnea, or lower extremity swelling.     Jacob Dickerson is taking metolazone 5 mg once a week.  He is also on torsemide 40 mg twice daily.  In the past he had been on 60 mg daily.     I did review [name]'s blood pressures and heart rates from home.  His blood pressures continue to run on the low side.  In the office today is 100/69 mmHg.  His heart rates have been a little on the higher side.  He pretty consistently been around 90 bpm.  This is all fairly unchanged from his baseline.  He is asymptomatic.       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) 07/24/2021    Poor dentition requiring referral to dentistry 07/24/2021    Stage 3b chronic kidney disease (HCC) 07/23/2021    Chronic anticoagulation 07/23/2021     Eliquis for afib      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, adult (HCC) 07/23/2021    Mild mitral valve regurgitation 07/23/2021    Ruptured chordae tendineae (HCC) 07/23/2021     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 07/23/21      BPH (benign prostatic hyperplasia) 07/23/2021    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    Chronic back pain 07/23/2021    Major depressive disorder 07/23/2021    Osteoarthritis of knee 07/23/2021    Gout 07/23/2021    Cardiomegaly 07/23/2021    Edema  07/15/2021    Other specified hypothyroidism 05/29/2021    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   Occasional pain in it but able to use it full time.      Primary hypertension 12/17/2010    Hyperlipidemia 12/17/2010    Gout attack 11/25/2010    Bladder stone 06/29/2009    S/P AVR (aortic valve replacement) 12/31/2007     01/08/08 [facility] - Dr [name]  using porcine-type valve  03/31/08 Echo:EF 55%. mild left ventricular hypertrophy, 17mm gradient across the aortic valve with mildly enlarged aortic root.    10/18/15- [facility]- Lexiscan MPI- Normal LV perfusion. Imaging is negative for ischemia. Global LV systolic function normal with EF 64%. Normal wall motion. Small, fixed, basal- inferolateral defect.  11/12/17- [facility]- Echo- Mild concentric LV hypertrophy. Normal LV function EF 55-60%. Well seated bioprosthetic valve. Mild tricuspid insufficiency. Mildly dilated aortic root.          Sprain and strain of other specified sites of shoulder and upper arm 02/02/2007       Review of Systems   Constitutional: Negative.   HENT: Negative.     Eyes: Negative.    Cardiovascular: Negative.    Respiratory: Negative.     Endocrine: Negative.    Hematologic/Lymphatic: Negative.    Skin: Negative.    Musculoskeletal: Negative.    Gastrointestinal: Negative.    Genitourinary: Negative.    Neurological: Negative.    Psychiatric/Behavioral: Negative.     Allergic/Immunologic: Negative.        Vitals:    06/23/23 1025   BP: 100/69   BP Source: Arm, Left Lower   Pulse: 92   SpO2: 93%   O2 Device: None (Room air)   PainSc: Zero   Weight: 136.1 kg (300 lb)   Height: 165.1 cm (5' 5)     Body mass index is 49.92 kg/m?Marland Kitchen    Physical Examination:  General Appearance: No acute distress. Fully alert and oriented.  Skin: Warm. No ulcers or xanthomas.   HEENT: Grossly unremarkable. Lips and oral mucosa without pallor or cyanosis. Moist mucous membranes.   Neck Veins: Normal jugular venous pressure. Neck veins are not distended.  Carotid Arteries: Normal carotid upstroke bilaterally. No bruits.  Chest Inspection: Midline scar consistent with prior sternotomy.  Auscultation/Percussion: Normal respiratory effort. Lungs clear to auscultation bilaterally. No wheezes, rales, or rhonchi.    Cardiac Rhythm: Regular rhythm. Normal rate.  Cardiac Auscultation: Normal S1 & S2. No S3 or S4. No rub.  Murmurs: No cardiac murmurs.  Peripheral Circulation: Normal peripheral circulation.   Abdominal Aorta: No abdominal aortic bruit.  Extremities: Appropriately warm to touch. No lower extremity edema.  Abdominal Exam: Soft, non-tender. No masses, no organomegaly. Normal bowel sounds.  Neurologic Exam: Neurological assessment grossly intact.       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  He is on torsemide 40 mg twice daily and spironolactone 25 mg daily.  He is taking metolazone 5 mg once weekly.  He also appears euvolemic  on exam.  A couple years ago his dry weight was felt to be 280-285 pounds.  More recently I think it has been around 295 pounds.  He is 300 pounds in the office today.  He does not appear hypervolemic on exam.  Current guideline directed medical therapy includes metoprolol succinate 100 mg daily, spironolactone 25 mg daily, and empagliflozin 10 mg daily.  No blood pressure room to add back ACE/ARB today.  Continue current regimen for now.  If blood pressure improves then would start lisinopril 2.5 or 5 mg daily and see if he tolerates it.  He has had issues with positional lightheadedness in the past.     Nonobstructive coronary artery disease:  He had angiogram 07/29/2021 with mild nonobstructive coronary artery disease.  Not on aspirin in favor of anticoagulation.  No exertional chest pain.     Aortic valve replacement:  Most recent echocardiogram from Jan 29, 2022 showed normal functioning bioprosthetic valve.     Hypertension: As detailed above.  We are pursuing guideline directed medical therapy.     Permanent atrial fibrillation.  Rate control strategy.  Heart rates are around 90 bpm at home and in the office today.  Continue metoprolol succinate 100 mg daily.  Continue apixaban 5 mg b.i.d.     Chronic kidney disease:  He has stage IIIB chronic kidney disease.  Seems to have stabilized some here recently with a creatinine of 2.2. Continue current medication regimen.  We are holding on lisinopril for the time being.     Dyslipidemia: Above goal.  His most recent fasting lipid panel is from 05/07/2023.  LDL 93.  Triglycerides 342.  Will repeat fasting lipid panel in 3 months.  If still above goal recommend increasing atorvastatin to 20 mg nightly.        I really enjoyed seeing Jacob Dickerson in the office today. I will arrange followup with Myrtie Hawk, APRN-NP in 3 months.  I will see him back in the office in 6 months.  If I can be of any assistance in the interim, please do not hesitate to reach out with questions or concerns.            Total time spent on today's office visit was 35 minutes. This includes face-to-face in person visit with patient as well as non face-to-face time including review of the electronic medical record, outside records, labs, radiologic studies, cardiovascular studies, formulation of treatment plan, after visit summary, future disposition, personal discussions, and documentation.    Current Medications (including today's revisions)   acetaminophen (TYLENOL EXTRA STRENGTH) 500 mg tablet Take one tablet by mouth every 4 hours as needed for Pain. Max of 4,000 mg of acetaminophen in 24 hours.    allopurinoL (ZYLOPRIM) 100 mg tablet Take one tablet by mouth twice daily. Indications: treatment to prevent acute gout attack    aluminum-magnesium hydroxide-simethicone (MAALOX MAXIMUM STRENGTH) 400-400-40 mg/5 mL suspension Take 15 mL by mouth every 6 hours as needed.    apixaban (ELIQUIS) 5 mg tablet Take one tablet by mouth twice daily.    aspirin 81 mg chewable tablet Chew one tablet by mouth daily. Take with food.    atorvastatin (LIPITOR) 10 mg tablet TAKE ONE TABLET BY MOUTH EVERY DAY (Patient taking differently: Take one tablet by mouth every evening.)    divalproex (DEPAKOTE EC) 250 mg DR tablet Take three tablets by mouth at bedtime daily. Take with food.    docusate (COLACE) 100 mg capsule Take one capsule by mouth  twice daily as needed.    empagliflozin (JARDIANCE) 10 mg tablet TAKE ONE TABLET BY MOUTH EVERY DAY    ERGOcalciferoL (vitamin D2) (DRISDOL) 1,250 mcg (50,000 unit) capsule Take one capsule by mouth every 7 days. Indications: vitamin D deficiency (high dose therapy)    fluticasone propionate (FLONASE) 50 mcg/actuation nasal spray, suspension Apply two sprays to each nostril as directed every morning.    gabapentin (NEURONTIN) 100 mg capsule Take one capsule by mouth twice daily.    levothyroxine (SYNTHROID) 75 mcg tablet Take one tablet by mouth daily 30 minutes before breakfast.    metOLazone (ZAROXOLYN) 5 mg tablet Take one tablet by mouth every Friday. Take on Fridays.  Indications: accumulation of fluid resulting from chronic heart failure    metoprolol succinate XL (TOPROL XL) 100 mg extended release tablet TAKE ONE TABLET BY MOUTH EVERY DAY    milk of magnesium 400 mg/5 mL oral suspension Take 30 mL by mouth every 24 hours as needed for Heartburn.    potassium chloride SR (K-DUR) 20 mEq tablet TAKE TWO TABLETS BY MOUTH TWICE DAILY WITH MEALS and a full glass of water; TAKE 1 extra TABLET on fridays WITH metolazone    sertraline (ZOLOFT) 50 mg tablet Take one tablet by mouth every morning.    spironolactone (ALDACTONE) 25 mg tablet TAKE ONE TABLET BY MOUTH DAILY. TAKE WITH FOOD.    tamsulosin (FLOMAX) 0.4 mg capsule Take one capsule by mouth every morning. Do not crush, chew or open capsules. Take 30 minutes following the same meal each day.    torsemide (DEMADEX) 20 mg tablet Take two tablets by mouth twice daily.

## 2023-06-23 NOTE — Patient Instructions
Thank you for visiting our office today.    Continue the same medications as you have been doing.          We will be pursuing the following tests after your appointment today:     Lab draw in 1 month  Orders Placed This Encounter    BASIC METABOLIC PANEL         We will plan to see you back in 3 months with Satira Anis, NP and 8-9 months with Dr. Sandria Manly.  Please call us in the meantime with any questions or concerns.        Please allow 5-7 business days for our providers to review your results. All normal results will go to MyChart. If you do not have Mychart, it is strongly recommended to get this so you can easily view all your results. If you do not have mychart, we will attempt to call you once with normal lab and testing results. If we cannot reach you by phone with normal results, we will send you a letter.  If you have not heard the results of your testing after one week please give [place] a call.       Your Cardiovascular Medicine [place] Team Brett Canales, Pilar Jarvis and [place])  phone number is [phone].

## 2023-08-03 ENCOUNTER — Encounter: Admit: 2023-08-03 | Discharge: 2023-08-03 | Payer: MEDICARE

## 2023-08-03 ENCOUNTER — Encounter: Admit: 2023-08-03 | Discharge: 2023-08-03 | Payer: 59

## 2023-08-03 MED ORDER — JARDIANCE 10 MG PO TAB
10 mg | ORAL_TABLET | Freq: Every day | ORAL | 0 refills | Status: AC
Start: 2023-08-03 — End: ?

## 2023-08-03 NOTE — Telephone Encounter
Rec'd refill request for Jardiance. Has been since 08/04/2022 since last seen. Call to pt and made appt for 08/17/23 @ 940am. Refills sent.

## 2023-08-09 ENCOUNTER — Encounter: Admit: 2023-08-09 | Discharge: 2023-08-09 | Payer: 59

## 2023-08-09 DIAGNOSIS — E7849 Other hyperlipidemia: Secondary | ICD-10-CM

## 2023-08-09 NOTE — Progress Notes
Lipid Management Review    LDL   Date Value Ref Range Status   05/07/2023 93  Final   08/30/2021 71  Final   07/17/2021 64 <100 mg/dL Final     Calcium Score Date & Result: N/A  Current lipid lowering medications: atorvastatin 10mg  daily  Previous lipid lowering medications tried/failed: N/A  Next OV: 10/14/23     Action Items: Patient to get testing completed. Due FLP in jan.

## 2023-08-14 ENCOUNTER — Encounter: Admit: 2023-08-14 | Discharge: 2023-08-14 | Payer: 59

## 2023-08-14 DIAGNOSIS — N1832 Stage 3b chronic kidney disease (HCC): Secondary | ICD-10-CM

## 2023-08-14 DIAGNOSIS — I10 Essential (primary) hypertension: Secondary | ICD-10-CM

## 2023-08-14 LAB — BASIC METABOLIC PANEL
BLD UREA NITROGEN: 70 — ABNORMAL HIGH (ref 7–25)
CALCIUM: 9.2
CHLORIDE: 90 — ABNORMAL LOW (ref 98–110)
CO2: 36 — ABNORMAL HIGH (ref 20–32)
CREATININE: 2 — ABNORMAL HIGH (ref 0.70–1.28)
GLUCOSE,PANEL: 158 — ABNORMAL HIGH (ref 65–99)
POTASSIUM: 4.1
SODIUM: 135

## 2023-08-17 ENCOUNTER — Ambulatory Visit: Admit: 2023-08-17 | Discharge: 2023-08-17 | Payer: MEDICARE

## 2023-08-17 ENCOUNTER — Encounter: Admit: 2023-08-17 | Discharge: 2023-08-17 | Payer: MEDICARE

## 2023-08-31 ENCOUNTER — Encounter: Admit: 2023-08-31 | Discharge: 2023-08-31 | Payer: MEDICARE

## 2023-09-09 ENCOUNTER — Encounter: Admit: 2023-09-09 | Discharge: 2023-09-09 | Payer: MEDICARE

## 2023-09-28 ENCOUNTER — Encounter: Admit: 2023-09-28 | Discharge: 2023-09-28 | Payer: 59

## 2023-09-28 NOTE — Telephone Encounter
Called and left VM requesting pt to complete needed labs.  Left callback number for any questions or concerns.   Faxed lab order to care facility.

## 2023-09-28 NOTE — Telephone Encounter
-----   Message from [place] L sent at 08/09/2023  1:58 PM CST -----  Regarding: due FLP in Jan  Chart review for LDL project. Per Oct transcription OV note Dr. Sandria Manly recommended repeat FLP in Jan. Pt reach out to pt to reminder him of this request. I put FLP order in. Thank you!

## 2023-10-02 ENCOUNTER — Encounter: Admit: 2023-10-02 | Discharge: 2023-10-02 | Payer: MEDICARE

## 2023-10-06 ENCOUNTER — Encounter: Admit: 2023-10-06 | Discharge: 2023-10-06 | Payer: 59

## 2023-10-07 ENCOUNTER — Encounter: Admit: 2023-10-07 | Discharge: 2023-10-07 | Payer: 59

## 2023-10-13 ENCOUNTER — Encounter: Admit: 2023-10-13 | Discharge: 2023-10-13 | Payer: MEDICARE

## 2023-10-19 ENCOUNTER — Encounter: Admit: 2023-10-19 | Discharge: 2023-10-19 | Payer: 59

## 2023-10-19 NOTE — Telephone Encounter
LVM x 1 regarding referral to gen cards in [place].

## 2023-10-19 NOTE — Telephone Encounter
-----   Message from [facility] sent at 10/14/2023 10:42 AM CST -----  Per Rosey Bath: call patient to see if he would like to establish with general cardiology in [place]. He does not need Advanced Heart Failure.     Patient showed up in clinic thinking his appointment was that day but it was 1/22. We were unable to see him on 1/21 in clinic. PSR in clinic rescheduled patient for April. Please offer MC for appointment information in the future.  ----- Message -----  From: Juanetta Gosling, BSN  Sent: 10/13/2023   2:00 PM CST  To: Juanetta Gosling, BSN    Call patient about earlier appt, APP gen card in [place], and [facility].

## 2023-10-27 ENCOUNTER — Encounter: Admit: 2023-10-27 | Discharge: 2023-10-27 | Payer: 59

## 2023-10-27 DIAGNOSIS — I5040 Unspecified combined systolic (congestive) and diastolic (congestive) heart failure: Secondary | ICD-10-CM

## 2023-10-27 MED ORDER — POTASSIUM CHLORIDE 20 MEQ PO TBTQ
ORAL_TABLET | 3 refills | 30.00000 days | Status: AC
Start: 2023-10-27 — End: ?

## 2023-10-30 ENCOUNTER — Encounter: Admit: 2023-10-30 | Discharge: 2023-10-30 | Payer: MEDICARE

## 2023-11-20 ENCOUNTER — Emergency Department: Admit: 2023-11-20 | Discharge: 2023-11-20 | Payer: MEDICARE

## 2023-11-20 ENCOUNTER — Inpatient Hospital Stay
Admit: 2023-11-20 | Discharge: 2023-11-27 | Disposition: A | Discharge status: Nursing Home (Includes ICF) | Payer: MEDICARE

## 2023-11-20 ENCOUNTER — Encounter: Admit: 2023-11-20 | Discharge: 2023-11-20 | Payer: MEDICARE

## 2023-11-20 DIAGNOSIS — E8770 Fluid overload, unspecified: Secondary | ICD-10-CM

## 2023-11-20 DIAGNOSIS — Z952 Presence of prosthetic heart valve: Secondary | ICD-10-CM

## 2023-11-20 DIAGNOSIS — I5040 Unspecified combined systolic (congestive) and diastolic (congestive) heart failure: Secondary | ICD-10-CM

## 2023-11-20 DIAGNOSIS — J9601 Acute respiratory failure with hypoxia: Secondary | ICD-10-CM

## 2023-11-20 DIAGNOSIS — J449 Chronic obstructive pulmonary disease, unspecified: Secondary | ICD-10-CM

## 2023-11-20 DIAGNOSIS — I4821 Permanent atrial fibrillation: Secondary | ICD-10-CM

## 2023-11-20 DIAGNOSIS — N1832 Stage 3b chronic kidney disease (HCC): Secondary | ICD-10-CM

## 2023-11-20 LAB — CBC AND DIFF
~~LOC~~ BKR ABSOLUTE BASO COUNT: 0.1 10*3/uL (ref 0.00–0.20)
~~LOC~~ BKR ABSOLUTE EOS COUNT: 0.1 10*3/uL (ref 0.00–0.45)
~~LOC~~ BKR ABSOLUTE LYMPH COUNT: 1 10*3/uL — ABNORMAL LOW (ref 1.00–4.80)
~~LOC~~ BKR ABSOLUTE MONO COUNT: 0.9 10*3/uL — ABNORMAL HIGH (ref 0.00–0.80)
~~LOC~~ BKR ABSOLUTE NEUTROPHIL: 5.4 10*3/uL (ref 1.80–7.00)
~~LOC~~ BKR BASOPHILS %: 0.7 % — ABNORMAL HIGH (ref 0.0–2.0)
~~LOC~~ BKR EOSINOPHILS %: 1.8 % (ref 0.0–5.0)
~~LOC~~ BKR HEMATOCRIT: 45 % — ABNORMAL LOW (ref 40.0–50.0)
~~LOC~~ BKR LYMPHOCYTES %: 13 % — ABNORMAL LOW (ref 24.0–44.0)
~~LOC~~ BKR MCHC: 32 g/dL — ABNORMAL HIGH (ref 32.0–36.0)
~~LOC~~ BKR MDW (MONOCYTE DISTRIBUTION WIDTH): 19 (ref <=20.6)
~~LOC~~ BKR MONOCYTES %: 12 % — ABNORMAL HIGH (ref 4.0–12.0)
~~LOC~~ BKR MPV: 8.8 fL (ref 7.0–11.0)
~~LOC~~ BKR NEUTROPHILS %: 71 % (ref 41.0–77.0)
~~LOC~~ BKR PLATELET COUNT: 177 10*3/uL (ref 150–400)
~~LOC~~ BKR RBC COUNT: 4.7 10*6/uL (ref 4.40–5.50)
~~LOC~~ BKR RDW: 17 % — ABNORMAL HIGH (ref 11.0–15.0)
~~LOC~~ BKR WBC COUNT: 7.6 10*3/uL (ref 4.50–11.00)

## 2023-11-20 LAB — MAGNESIUM: ~~LOC~~ BKR MAGNESIUM: 1.8 mg/dL — ABNORMAL HIGH (ref 1.6–2.6)

## 2023-11-20 LAB — NT-PRO-BNP: ~~LOC~~ BKR NT-PRO-BNP: 233 pg/mL — ABNORMAL HIGH (ref 26.0–<450)

## 2023-11-20 LAB — POC LACTATE: ~~LOC~~ BKR POC LACTIC ACID: 1.7 mmol/L (ref 0.5–2.0)

## 2023-11-20 MED ORDER — ONDANSETRON 4 MG PO TBDI
4 mg | ORAL | 0 refills | Status: DC | PRN
Start: 2023-11-20 — End: 2023-11-27

## 2023-11-20 MED ORDER — LEVOTHYROXINE 75 MCG PO TAB
75 ug | Freq: Every day | ORAL | 0 refills | Status: DC
Start: 2023-11-20 — End: 2023-11-27
  Administered 2023-11-21 – 2023-11-27 (×7): 75 ug via ORAL

## 2023-11-20 MED ORDER — BUMETANIDE 0.25 MG/ML IJ SOLN
4 mg | Freq: Two times a day (BID) | INTRAVENOUS | 0 refills | Status: DC
Start: 2023-11-20 — End: 2023-11-23
  Administered 2023-11-21 (×2): 4 mg via INTRAVENOUS

## 2023-11-20 MED ORDER — SENNOSIDES-DOCUSATE SODIUM 8.6-50 MG PO TAB
1 | Freq: Every day | ORAL | 0 refills | Status: DC | PRN
Start: 2023-11-20 — End: 2023-11-27

## 2023-11-20 MED ORDER — TAMSULOSIN 0.4 MG PO CAP
.4 mg | Freq: Every morning | ORAL | 0 refills | Status: DC
Start: 2023-11-20 — End: 2023-11-27
  Administered 2023-11-21 – 2023-11-27 (×7): 0.4 mg via ORAL

## 2023-11-20 MED ORDER — MELATONIN 5 MG PO TAB
5 mg | Freq: Every evening | ORAL | 0 refills | Status: DC | PRN
Start: 2023-11-20 — End: 2023-11-27
  Administered 2023-11-21 – 2023-11-23 (×3): 5 mg via ORAL

## 2023-11-20 MED ORDER — ATORVASTATIN 40 MG PO TAB
20 mg | Freq: Every day | ORAL | 0 refills | Status: DC
Start: 2023-11-20 — End: 2023-11-23
  Administered 2023-11-21 – 2023-11-23 (×4): 20 mg via ORAL

## 2023-11-20 MED ORDER — POLYETHYLENE GLYCOL 3350 17 GRAM PO PWPK
1 | Freq: Every day | ORAL | 0 refills | Status: DC | PRN
Start: 2023-11-20 — End: 2023-11-27

## 2023-11-20 MED ORDER — ASPIRIN 81 MG PO CHEW
81 mg | Freq: Every day | ORAL | 0 refills | Status: DC
Start: 2023-11-20 — End: 2023-11-21

## 2023-11-20 MED ORDER — SERTRALINE 50 MG PO TAB
50 mg | Freq: Every morning | ORAL | 0 refills | Status: DC
Start: 2023-11-20 — End: 2023-11-27
  Administered 2023-11-21 – 2023-11-27 (×7): 50 mg via ORAL

## 2023-11-20 MED ORDER — ALLOPURINOL 100 MG PO TAB
100 mg | Freq: Two times a day (BID) | ORAL | 0 refills | Status: DC
Start: 2023-11-20 — End: 2023-11-27
  Administered 2023-11-21 – 2023-11-27 (×14): 100 mg via ORAL

## 2023-11-20 MED ORDER — BUMETANIDE 0.25 MG/ML IJ SOLN
4 mg | Freq: Once | INTRAVENOUS | 0 refills | Status: CP
Start: 2023-11-20 — End: ?
  Administered 2023-11-20 (×2): 4 mg via INTRAVENOUS

## 2023-11-20 MED ORDER — SPIRONOLACTONE 25 MG PO TAB
25 mg | Freq: Every day | ORAL | 0 refills | Status: DC
Start: 2023-11-20 — End: 2023-11-27
  Administered 2023-11-21 (×2): 25 mg via ORAL

## 2023-11-20 MED ORDER — ONDANSETRON HCL (PF) 4 MG/2 ML IJ SOLN
4 mg | INTRAVENOUS | 0 refills | Status: DC | PRN
Start: 2023-11-20 — End: 2023-11-27
  Administered 2023-11-23: 02:00:00 4 mg via INTRAVENOUS

## 2023-11-20 MED ORDER — METOPROLOL SUCCINATE 50 MG PO TB24
100 mg | Freq: Every day | ORAL | 0 refills | Status: DC
Start: 2023-11-20 — End: 2023-11-27
  Administered 2023-11-21 – 2023-11-27 (×7): 100 mg via ORAL

## 2023-11-20 MED ORDER — EMPAGLIFLOZIN 10 MG PO TAB
10 mg | Freq: Every day | ORAL | 0 refills | Status: DC
Start: 2023-11-20 — End: 2023-11-27
  Administered 2023-11-21 – 2023-11-27 (×7): 10 mg via ORAL

## 2023-11-20 MED ORDER — APIXABAN 5 MG PO TAB
5 mg | Freq: Two times a day (BID) | ORAL | 0 refills | Status: DC
Start: 2023-11-20 — End: 2023-11-27
  Administered 2023-11-21 – 2023-11-27 (×14): 5 mg via ORAL

## 2023-11-20 MED ORDER — DIVALPROEX 250 MG PO TBEC
750 mg | Freq: Every evening | ORAL | 0 refills | Status: DC
Start: 2023-11-20 — End: 2023-11-27
  Administered 2023-11-21 – 2023-11-27 (×13): 750 mg via ORAL

## 2023-11-20 MED ORDER — GABAPENTIN 100 MG PO CAP
100 mg | Freq: Two times a day (BID) | ORAL | 0 refills | Status: DC
Start: 2023-11-20 — End: 2023-11-23
  Administered 2023-11-21 – 2023-11-23 (×6): 100 mg via ORAL
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
## 2023-11-20 NOTE — ED Notes
 78 YO M in wheelchair to [facility] for leg swelling. Reports feeling generally fluid overloaded with symptoms including SOA on exertion and arm/leg edema. Denies CP. States compliance with diuretics. SpO2 89% ORA. Pt refusing to wear oxygen d/t throat dryness. Offered ice chips and humidified SpO2. Pt continues to refuse. Pt resting in cart, side rails raised, call light within reach. Awaiting provider eval.

## 2023-11-21 ENCOUNTER — Inpatient Hospital Stay: Admit: 2023-11-21 | Discharge: 2023-11-21 | Payer: MEDICARE

## 2023-11-21 DIAGNOSIS — I5043 Acute on chronic combined systolic (congestive) and diastolic (congestive) heart failure: Secondary | ICD-10-CM

## 2023-11-21 LAB — 2D + DOPPLER ECHO
AV INDEX (NATIVE): 0.4
AV MEAN GRADIENT: 7 mmHg
AV PEAK GRADIENT: 13 mmHg
AV PEAK VELOCITY: 1.7 m/s
AV VALVE AREA: 2.6 cm2
BSA: 2.4 m2
DOP CALC AO VTI: 31 cm
DOP CALC LVOT AREA: 6.1 cm2
DOP CALC LVOT DIAMETER: 2.8 cm
DOP CALC LVOT PEAK VEL VTI: 13 cm
DOP CALC LVOT PEAK VEL: 0.8 m/s
DOP CALC LVOT STROKE VOLUME: 83 cm3
E/A RATIO: 1.6
EJECTION FRACTION: 41 %
FRACTIONAL SHORTENING: 24 % (ref 28–44)
INTERVENTRICULAR SEPTUM: 1.5 cm (ref 0.6–1)
IVC OSTIUM: 2.9 cm
LATERAL E/E' RATIO: 8
LEFT ATRIUM INDEX: 53 mL/m2 (ref 16–34)
LEFT ATRIUM SIZE: 5.7 cm (ref 3–4)
LEFT ATRIUM VOLUME: 131 mL (ref 18–58)
LEFT INTERNAL DIMENSION IN SYSTOLE: 4 cm (ref 2.5–4)
LEFT VENTRICLE DIASTOLIC VOLUME INDEX: 48 mL/m2 (ref 34–74)
LEFT VENTRICLE DIASTOLIC VOLUME: 119 mL (ref 62–150)
LEFT VENTRICLE MASS INDEX: 136 g/m2 (ref 49–115)
LEFT VENTRICLE SYSTOLIC VOLUME INDEX: 26 mL/m2 (ref 11–31)
LEFT VENTRICLE SYSTOLIC VOLUME: 64 mL (ref 21–61)
LEFT VENTRICULAR INTERNAL DIMENSION IN DIASTOLE: 5.3 cm (ref 4.2–5.8)
LEFT VENTRICULAR MASS: 335 g (ref 88–224)
MEDIAL E/E' RATIO: 9
MV PEAK A VEL: 0.4 m/s
MV PEAK E VEL PW: 0.7 m/s
POSTERIOR WALL: 1.4 cm (ref 0.6–1)
PROX AORTA: 4.6 cm (ref 2.6–3.8)
RELATIVE WALL THICKNESS: 0.5 (ref <=0.42)
RIGHT ATRIAL AREA: 34 cm2 (ref <18)
RIGHT HEART SYSTOLIC MMODE TAPSE: 1.6 cm (ref >1.7)
RIGHT HEART SYSTOLIC TDI S': 0.1 m/s
RIGHT VENTRICULAR BASAL DIAMETER: 4.2 cm (ref 2.5–4.1)
RIGHT VENTRICULAR MID DIAMETER: 2.4 cm (ref 1.9–3.5)
RV SYSTOLIC PRESSURE: 25
SIMPSONS BIPLANE EF: 48 %
SINUS: 3.6 cm (ref 2.8–4)
TDI LATERAL E': 0 m/s
TDI MEDIAL E': 0 m/s
TR PEAK VELOCITY: 2.5 m/s

## 2023-11-21 MED ORDER — LIDOCAINE 5 % TP PTMD
2 | Freq: Every day | TOPICAL | 0 refills | Status: DC
Start: 2023-11-21 — End: 2023-11-27
  Administered 2023-11-22 – 2023-11-27 (×7): 2 via TOPICAL

## 2023-11-21 MED ORDER — SODIUM CHLORIDE 0.9 % IJ SOLN
10 mL | Freq: Once | INTRAVENOUS | 0 refills | Status: CP
Start: 2023-11-21 — End: ?
  Administered 2023-11-21: 18:00:00 10 mL via INTRAVENOUS

## 2023-11-21 MED ORDER — ACETAMINOPHEN 325 MG PO TAB
650 mg | ORAL | 0 refills | Status: DC | PRN
Start: 2023-11-21 — End: 2023-11-22
  Administered 2023-11-21 – 2023-11-22 (×4): 650 mg via ORAL

## 2023-11-21 MED ORDER — EUCALYPTUS-MENTHOL MM LOZG
1 | ORAL | 0 refills | Status: DC | PRN
Start: 2023-11-21 — End: 2023-11-27
  Administered 2023-11-21: 22:00:00 1 via ORAL

## 2023-11-21 MED ORDER — FENTANYL CITRATE (PF) 50 MCG/ML IJ SOLN
25 ug | Freq: Once | INTRAVENOUS | 0 refills | Status: CP
Start: 2023-11-21 — End: ?
  Administered 2023-11-22: 06:00:00 25 ug via INTRAVENOUS

## 2023-11-21 MED ORDER — PERFLUTREN LIPID MICROSPHERES 1.1 MG/ML IV SUSP
1-10 mL | Freq: Once | INTRAVENOUS | 0 refills | Status: CP | PRN
Start: 2023-11-21 — End: ?
  Administered 2023-11-21: 18:00:00 3 mL via INTRAVENOUS

## 2023-11-21 NOTE — Progress Notes
 BH 45 END OF SHIFT/ JHFRAT NOTE    Admission Date: 11/20/2023    Acute events, interventions, provider communication: yes    Patient c/o that he had chronic back pain rated 10/10. Notified [facility] & received order tylenol oral 650 mg every 6 hours as need by Ponciano Ort, MD. Medication given.    Patient Interventions and Education  Fall Risk/JHFRAT Interventions and Education: (Charting when applicable)  Elimination Interventions : Place male/male urinal within reach   Medications : Stay within arm's reach during toileting/showering (i.e., dizziness, orthostasis)   Patient Care Equipment: Needs assistance with patient care equipment when ambulating  Mobility: Assist x1 and Utilize walker, cane, or additional walking aid for ambulation  Cognition: Stay within arm's reach while patient ambulating/toileting/showering  Risk for Moderate/Major Injury: Age: >65 yrs    2. Restraints:  No     Restraint Goal: Patient will be free from injury while physically restrained.  See Docflowsheet for restraint documentation, interventions, education, etc.    Intake and Output:       Intake/Output Summary (Last 24 hours) at 11/21/2023 0344  Last data filed at 11/21/2023 0342  Gross per 24 hour   Intake 657 ml   Output 2400 ml   Net -1743 ml
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
## 2023-11-21 NOTE — Progress Notes
 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 ER with leg swelling/fluid overload along with worsening shortness of breath.  Vital signs in the ER showed slight hypoxemia/stable creatinine elevated NT proBNP at 2339.  Chest x-ray obtained on admission showed cardiomegaly/worsening interstitial opacities compared to last chest x-ray.     Bilateral lower extremity swelling  Acute on chronic combined systolic and diastolic congestive heart failure  Bicuspid aortic valve status post SAVR -> TAVR  - The patient follows with [facility] cardiology/Dr. Sandria Manly.  Most recent echo LV[facility] well-seated aortic valve.  Prior to admission medication regimen:  Eliquis, aspirin, Lipitor 20, Jardiance, metolazone every Friday, Toprol 100 daily, Aldactone 25 daily and torsemide 40 mg twice daily.  - The patient is presenting with bilateral lower extremity edema fluid overload/shortness of breath.  His weight now is up to 303, he says he was as low as 280 when he was dry  -Patient does not know the majority of his medications, he lives in an assisted living facility and the medications are often just brought to them and he also feels like there is not much flexibility in his overall meals  Plan:  -Continue IV Bumex 4 mg IV twice daily  -Cardiogram completed, results pending  - Continue Jardiance/Toprol/Aldactone  -Cardiology consulted     Paroxysmal atrial fibrillation  - Continue anticoagulation with Eliquis, monitor on telemetry     COPD stage IV  Acute on chronic hypoxemic respiratory failure  - Baseline is 2 L of oxygen,, presentation did require 3 to 4 L transiently suspected likely secondary to fluid overload.  Did improve back to 2 L with diuresis  - No wheezing on exam  - Duonebs PRN     CKD stage III  - Will monitor Cr with diuresis, baseline creatinine approximately 1.8-2  - Continue Gabapentin for neuropathy     Mood disorder  - Continue Depakote and sertraline     Hypothyroidism-continue PTA Synthroid  BPH-continue PTA tamsulosin       Code: DNAR-FI, see ACP note from 3/1    Dispo: Continued patient hospitalization    Patient was discussed at multidisciplinary rounds which included nurse, pharmacist, social work, and nurse case management who provided additional information that affected the patient's care.    High medical decision making due to the following:  drug therapy requiring intensive monitoring  for toxicity, IV controlled substances, and decision regarding hospitalization     Note to patient: The 21st Century Cures Act makes medical notes like these available to patients in the interest of transparency. However, be advised this is a medical document. It is intended as peer to peer communication. It is written in medical language and may contain abbreviations or verbiage that are unfamiliar. It may appear blunt or direct. Medical documents are intended to carry relevant information, facts as evident, and the clinical opinion of the practitioner.    Clearence Ped, DO  Pager [phone]    __________________________________________________________________________________  Subjective: Jacob Dickerson is a 78 y.o. male n patient extremely hard of hearing, especially on the left side so have to speak quite loudly into his right ear.  no acute events overnight.  He feels like his lower extremity swelling did improve.  From when he initially came in.  At baseline he does not lay flat due to chronic back pain.  No fevers, chills, sweats.  He is slowly over the last 2 to 3 weeks been putting on weight in his legs and gotten more dyspneic    Medications:  Scheduled Meds:allopurinoL (ZYLOPRIM) tablet 100 mg, 100 mg, Oral, BID  apixaban (ELIQUIS) tablet 5 mg, 5 mg, Oral, BID  atorvastatin (LIPITOR) tablet 20 mg, 20 mg, Oral, QDAY  bumetanide (BUMEX) injection 4 mg, 4 mg, Intravenous, BID(9-17)  divalproex (DEPAKOTE) DR tablet 750 mg, 750 mg, Oral, QHS  empagliflozin (JARDIANCE) tablet 10 mg, 10 mg, Oral, QDAY  gabapentin (NEURONTIN) capsule 100 mg, 100 mg, Oral, BID  levothyroxine (SYNTHROID) tablet 75 mcg, 75 mcg, Oral, QDAY 30 min before breakfast  metoprolol succinate XL (TOPROL XL) tablet 100 mg, 100 mg, Oral, QDAY  sertraline (ZOLOFT) tablet 50 mg, 50 mg, Oral, QAM8  spironolactone (ALDACTONE) tablet 25 mg, 25 mg, Oral, QDAY  tamsulosin (FLOMAX) capsule 0.4 mg, 0.4 mg, Oral, QAM8    Continuous Infusions:  PRN and Respiratory Meds:acetaminophen Q6H PRN, eucalyptus-menthoL Q2H PRN, melatonin QHS PRN, ondansetron Q6H PRN **OR** ondansetron (ZOFRAN) IV Q6H PRN, polyethylene glycol 3350 QDAY PRN, sennosides-docusate sodium QDAY PRN      Physical Exam:  Vital Signs: Last Filed In 24 Hours Vital Signs: 24 Hour Range   BP: 104/65 (03/01 1157)  Temp: 36.5 ?C (97.7 ?F) (03/01 1157)  Pulse: 88 (03/01 0740)  Respirations: 16 PER MINUTE (03/01 1157)  SpO2: 94 % (03/01 1157)  O2 Device: Nasal cannula (03/01 1157)  O2 Liter Flow: 2 Lpm (03/01 1157)  Height: 165.1 cm (5' 5) (03/01 0740) BP: (93-132)/(50-109)   Temp:  [36.3 ?C (97.3 ?F)-36.5 ?C (97.7 ?F)]   Pulse:  [83-105]   Respirations:  [15 PER MINUTE-26 PER MINUTE]   SpO2:  [92 %-96 %]   O2 Device: Nasal cannula  O2 Liter Flow: 2 Lpm Intensity Pain Scale (Self Report): 9 (11/21/23 0815)      General:  Alert, cooperative, no distress, appears stated age  Eyes:  Conjunctivae/corneas clear. EOMs intact.   Neck: Unable to appreciate JVD due to body habitus  Lungs:  Clear to auscultation bilaterally  Chest wall:  No tenderness or deformity.  Heart:  RRR, S1, S2, no other murmurs  Abdomen:  (+)BS, soft, nt/nd  Extremities: 1+ lower extremity edema    Lab/Radiology/Other Diagnostic Tests:  24-hour labs:    Results for orders placed or performed during the hospital encounter of 11/20/23 (from the past 24 hours)   CBC AND DIFF    Collection Time:  11/21/23  5:01 AM   Result Value Ref Range    White Blood Cells 8.90 4.50 - 11.00 10*3/uL    Red Blood Cells 4.36 (L) 4.40 - 5.50 10*6/uL    Hemoglobin 14.0 13.5 - 16.5 g/dL    Hematocrit 16.1 09.6 - 50.0 %    MCV 96.6 80.0 - 100.0 fL    MCH 32.2 26.0 - 34.0 pg    MCHC 33.3 32.0 - 36.0 g/dL    RDW 04.5 (H) 40.9 - 15.0 %    Platelet Count 164 150 - 400 10*3/uL    MPV 8.5 7.0 - 11.0 fL    Neutrophils 66.3 41.0 - 77.0 %    Lymphocytes 18.4 (L) 24.0 - 44.0 %    Monocytes 12.4 (H) 4.0 - 12.0 %    Eosinophils 2.1 0.0 - 5.0 %    Basophils 0.8 0.0 - 2.0 %    Absolute Neutrophil Count 5.90 1.80 - 7.00 10*3/uL    Absolute Lymph Count 1.60 1.00 - 4.80 10*3/uL    Absolute Monocyte Count 1.10 (H) 0.00 - 0.80 10*3/uL    Absolute Eosinophil Count 0.20 0.00 - 0.45 10*3/uL    Absolute Basophil Count 0.10 0.00 - 0.20 10*3/uL   COMPREHENSIVE METABOLIC PANEL    Collection Time: 11/21/23  5:01 AM   Result Value Ref Range    Sodium 136 (L) 137 - 147 mmol/L    Potassium 3.9 3.5 - 5.1 mmol/L    Chloride 89 (L) 98 - 110 mmol/L    Glucose 111 (H) 70 - 100 mg/dL    Blood Urea Nitrogen 60 (H) 7 - 25 mg/dL    Creatinine 8.11 (H) 0.40 - 1.24 mg/dL    Calcium 9.5 8.5 - 91.4 mg/dL    Total Protein 7.2 6.0 - 8.0 g/dL    Total Bilirubin 0.6 0.2 - 1.3 mg/dL    Albumin 3.7 3.5 - 5.0 g/dL    Alk Phosphatase 782 (H) 25 - 110 U/L    AST 36 7 - 40 U/L    ALT 18 7 - 56 U/L    CO2 35 (H) 21 - 30 mmol/L    Anion Gap 12 3 - 12    Glomerular Filtration Rate (GFR) 29 (L) >60 mL/min   MAGNESIUM    Collection Time: 11/21/23  5:01 AM   Result Value Ref Range    Magnesium 1.8 1.6 - 2.6 mg/dL   PHOSPHORUS    Collection Time: 11/21/23  5:01 AM   Result Value Ref Range    Phosphorus 4.8 (H) 2.0 - 4.5 mg/dL     Glucose: (!) 956 (21/30/86 0501)  Pertinent radiology reviewed.    Sydnee Levans, DO  Pager [phone]

## 2023-11-21 NOTE — Progress Notes
 BH 45 END OF SHIFT/ JHFRAT NOTE    Admission Date: 11/20/2023    Acute events, interventions, provider communication: NA    Patient Interventions and Education  Fall Risk/JHFRAT Interventions and Education: (Charting when applicable)  Elimination Interventions : Place male/male urinal within reach   Medications : Stay within arm's reach during toileting/showering (i.e., dizziness, orthostasis)  and Educate patient on medication side effects  Patient Care Equipment: Needs assistance with patient care equipment when ambulating, Ensure environment is free of clutter and walkways are clear from tripping hazards, and Assess need for patient equipment and remove if not in use  Mobility: Assist x1, Utilize walker, cane, or additional walking aid for ambulation, and Elimination equipment at bedside (urinal or commode)   Cognition: N/A  Risk for Moderate/Major Injury: Age: >65 yrs    2. Restraints:  No     Restraint Goal: Patient will be free from injury while physically restrained.  See Docflowsheet for restraint documentation, interventions, education, etc.    Intake and Output:       Intake/Output Summary (Last 24 hours) at 11/21/2023 1653  Last data filed at 11/21/2023 1649  Gross per 24 hour   Intake 1139 ml   Output 2425 ml   Net -1286 ml              Last Bowel Movement Date: 11/21/23

## 2023-11-22 ENCOUNTER — Encounter: Admit: 2023-11-22 | Discharge: 2023-11-22 | Payer: MEDICARE

## 2023-11-22 LAB — HIGH SENSITIVITY TROPONIN I, RANDOM: ~~LOC~~ BKR HIGH SENSITIVITY TROPONIN I: 36 ng/L — ABNORMAL HIGH (ref <20)

## 2023-11-22 LAB — COMPREHENSIVE METABOLIC PANEL: ~~LOC~~ BKR CREATININE: 2.5 mg/dL — ABNORMAL HIGH (ref 0.40–1.24)

## 2023-11-22 MED ORDER — FLUTICASONE PROPIONATE 50 MCG/ACTUATION NA SPSN
1 | Freq: Two times a day (BID) | NASAL | 0 refills | Status: DC | PRN
Start: 2023-11-22 — End: 2023-11-27
  Administered 2023-11-23: 02:00:00 1 via NASAL

## 2023-11-22 MED ORDER — LIDOCAINE HCL 2 % MM SOLN
15 mL | ORAL | 0 refills | Status: DC | PRN
Start: 2023-11-22 — End: 2023-11-27

## 2023-11-22 MED ORDER — ALUM-MAG HYDROXIDE-SIMETH 200-200-20 MG/5 ML PO SUSP
30 mL | ORAL | 0 refills | Status: DC | PRN
Start: 2023-11-22 — End: 2023-11-27
  Administered 2023-11-22: 21:00:00 30 mL via ORAL

## 2023-11-22 MED ORDER — METHOCARBAMOL 750 MG PO TAB
750 mg | ORAL | 0 refills | Status: DC | PRN
Start: 2023-11-22 — End: 2023-11-27
  Administered 2023-11-22 – 2023-11-27 (×9): 750 mg via ORAL

## 2023-11-22 MED ORDER — ACETAMINOPHEN 500 MG PO TAB
1000 mg | ORAL | 0 refills | Status: DC | PRN
Start: 2023-11-22 — End: 2023-11-27
  Administered 2023-11-22 – 2023-11-27 (×13): 1000 mg via ORAL

## 2023-11-22 NOTE — Case Management (ED)
 Case Management Admission Assessment    NAME:Jacob Dickerson                          MRN: [id number]             DOB:12/23/1945          AGE: 78 y.o.  ADMISSION DATE: 11/20/2023             DAYS ADMITTED: LOS: 2 days      Today?s Date: 11/22/2023    Source of Information: Patient       Plan  Plan: Case Management Assessment, Assist PRN with SW/NCM Services    This CM met with pt for assessment on this date.  Provided contact information and explanation of SW/NCM roles.  Reviewed [facility], Preparing for Discharge, and [facility] hand-outs.  Provided opportunity for questions and discussion. Pt/family encouraged to contact Case Management team with questions and concerns during hospitalization and until patient is able to transition back to the patient's primary care physician.    NCM verified demographics with patient.  Pt lives alone in a single story apartment at [facility] [place]. He reports there are no stairs on the facility campus. Pt reports having walk-in shower and uses a shower chair and grab bars.  Pt reports being independent with personal care ADL's and light cleaning. The staff at his facility assists with medication management, some transportation, and provides 3 meals a day.  Pt reports having access to a shower chair, canes, and a walker.  Pt denied prior [facility] services but NCM sees prior CM note sharing pt received HH services through [facility].  Pt will need transportation assistance upon discharge home. He states facility does not provide transport.  Pt likes to fill rx's at [facility] (info below) and states medications are affordable.  Pt does not have DPOA and declines assistance completing while inpatient.  Pt denies any SDOH, MH, or substance use concerns.  Pt reports being current with PCP, Jacob Dickerson.  Pt shares that MH medications are managed by PCP.  CM team will continue to follow for discharge needs while inpatient.    Patient Address/Phone  [address]  [place] [address]  [phone] (home)     Emergency Contact  Extended Emergency Contact Information  Primary Emergency Contact: [facility] Phone: [phone]  Mobile Phone: [phone]  Relation: Daughter    Healthcare Directive  Healthcare Directive: No, patient does not have a healthcare directive  Would patient like to fill out a (a new) Healthcare Directive?: No, patient declined  Psych Advance Directive (Psych unit only): No, patient does not have a Social research officer, government  Does the Patient Need Case Management to Arrange Discharge Transport? (ex: facility, ambulance, wheelchair/stretcher, Medicaid, cab, other): Yes  Type of Transport: Medicaid  Will the Patient Use Family Transport?: No    Expected Discharge Date  11/23/2023 2:00 PM    Living Situation Prior to Admission  Living Arrangements  Type of Residence: Assisted living facility [facility])  Living Arrangements: Alone  Financial risk analyst / Tub: Psychologist, counselling (With grab bars and shower chair)  How many levels in the residence?: 1  Can patient live on one level if needed?: N/A  Does residence have entry and/or inside stairs?: No  Assistance needed prior to admit or anticipated on discharge: Yes  Who provides assistance or could if needed?: Staff  at ALF and daughter, Jacob Dickerson [phone])  Are they in good health?: Unknown  Can support system provide 24/7 care if needed?: Maybe  Level of Function   Prior level of function: Needs assist with ADLs  Which ADLs require assistance?: Medications, meals, some transportation  Who assists with ADLs?: Staff at ALF and daughter, Jacob Dickerson [phone])  Cognitive Abilities   Cognitive Abilities: Alert and Oriented, Participates in decision making, Engages in problem solving and planning    Financial Resources  Coverage  Primary Insurance: Quarry manager ([employer])  Secondary Insurance: Medicaid ([employer])  Medicaid State: [place]  Additional Coverage: None  Medication Coverage    Medication Coverage: Medicaid  Have you experienced a noticeable increase in your copay costs recently?: No  Are current medications affordable?: Yes  Do You Use a Co-Pay Card or a Medication Assistance Program to Help Manage Medication Costs?: No  Do You Manage Your Own Medications?: No  Who is Responsible for Ordering and Setting up Medications?: Staff at ALF and daughter, Jacob Dickerson [phone])  Source of Income   Source Of Income: SSI, Other retirement income  Financial Assistance Needed?      Psychosocial Needs  Mental Health  Mental Health History: Yes  Mental Health Symptoms: Feeling depressed  Substance Use History  Substance Use History Screen: No  Other      Current/Previous Services  PCP  Jacob Dickerson, [phone], [phone]  Pharmacy    [facility] [place], [place] - [address]  [address]  [place] [address]  Phone: [phone] Fax: [phone]    [id number] - [place], [place] - [address]  [address]  [place] [address]  Phone: [phone] Fax: [phone]    Durable Medical Equipment   Durable Medical Equipment at home: Shower Chair, Single [place], [place], [place]  Home Health  Receiving home health: In the past  Agency name: [facility]  Would patient use this agency again?: Yes  Hemodialysis or Peritoneal Dialysis  Undergoing hemodialysis or peritoneal dialysis: No  Tube/Enteral Feeds  Receive tube/enteral feeds: No  Infusion  Receive infusions: No  Private Duty  Private duty help used: No  Home and Community Based Services  Home and community based services: No  Ryan White  Ryan White: N/A  Hospice  Hospice: No  Outpatient Therapy  PT: In the past  Name of rehab location/group: [facility]  Would patient return for future services?: Yes  OT: In the past  Name of rehab location/group: [facility]  Would patient return for future services?: Yes  SLP: No  Skilled Nursing Facility/Nursing Home  SNF: No  NH: No  Inpatient Rehab  IPR: No  Long-Term Acute Care Hospital  LTACH: No  Acute Hospital Stay  Acute Hospital Stay: In the past  Was patient's stay within the last 30 days?: No      Yates Decamp, RN, Brewing technologist  Office: [phone]  Available on [place]

## 2023-11-22 NOTE — Progress Notes
 BH 45 END OF SHIFT/ JHFRAT NOTE    Admission Date: 11/20/2023    Acute events, interventions, provider communication: NA    Patient Interventions and Education  Fall Risk/JHFRAT Interventions and Education: (Charting when applicable)  Elimination Interventions : Place male/male urinal within reach   Medications : Stay within arm's reach during toileting/showering (i.e., dizziness, orthostasis)  and Educate patient on medication side effects  Patient Care Equipment: Needs assistance with patient care equipment when ambulating, Ensure environment is free of clutter and walkways are clear from tripping hazards, and Assess need for patient equipment and remove if not in use  Mobility: Assist x1 and Utilize walker, cane, or additional walking aid for ambulation  Cognition: N/A  Risk for Moderate/Major Injury: Age: >65 yrs and Active Anticoagulation    2. Restraints:  No     Restraint Goal: Patient will be free from injury while physically restrained.  See Docflowsheet for restraint documentation, interventions, education, etc.    Intake and Output:       Intake/Output Summary (Last 24 hours) at 11/22/2023 1642  Last data filed at 11/22/2023 1618  Gross per 24 hour   Intake 810 ml   Output 2025 ml   Net -1215 ml              Last Bowel Movement Date: 11/21/23

## 2023-11-22 NOTE — Progress Notes
 RT Adult Assessment Note    NAME:Samule LONZY MATO             MRN: [id number]             DOB:01-01-1946          AGE: 78 y.o.  ADMISSION DATE: 11/20/2023             DAYS ADMITTED: LOS: 2 days    Additional Comments:  Impressions of the patient: pt resting in bed on 2L no respiratory distress noted   Intervention(s)/outcome(s): assessed pt, pt denies hx/dx of OSA along with home O2 use, stating he does not take any inhalers or nebulizer's   Patient education that was completed: none  Recommendations to the care team: continue with BID O2 checks    Vital Signs:  Pulse: 86  RR: 20 PER MINUTE  SpO2: 92 %  O2 Device: Nasal cannula  Liter Flow: 2 Lpm  O2%:      Breath Sounds:   Right Apex Breath Sounds: Clear (Implies normal)  Right Base Breath Sounds: Decreased  Left Apex Breath Sounds: Clear (Implies normal)  Left Base Breath Sounds: Decreased  Respiratory Effort:   Respiratory Effort/Pattern: SOA (Short of air) on exertion  Comments:

## 2023-11-22 NOTE — Progress Notes
 PHYSICAL THERAPY  NOTE      Name: Jacob Dickerson   MRN: [id number]     DOB: Mar 22, 1946      Age: 78 y.o.  Admission Date: 11/20/2023     LOS: 2 days     Date of Service: 11/22/2023      PT orders received and appreciated. Patient declined to participate despite encouragement and education about the role and benefits of Physical Therapy - reports being in too much pain (back pain) to move at this time.  Rehabilitation services will continue to follow and provide intervention as indicated.      Therapist: Les Pou, PT  Date: 11/22/2023

## 2023-11-22 NOTE — Progress Notes
 BH 45 END OF SHIFT/ JHFRAT NOTE    Admission Date: 11/20/2023    Acute events, interventions, provider communication: yes    2148- Notified MPR that patient c/o his back pain rated 10/10 & tylenol did not help him. Standley Dakins, MD ordered lidocaine patch daily ( given).     2315- Notified MPR again due to his back pain still sharp & rated 10/10, his BP was better. Dr. Ninfa Linden ordered fentanyl injection 25 mcg once. medication given.     Patient Interventions and Education  Fall Risk/JHFRAT Interventions and Education: (Charting when applicable)  Elimination Interventions : Place male/male urinal within reach   Medications : Stay within arm's reach during toileting/showering (i.e., dizziness, orthostasis)   Patient Care Equipment: Needs assistance with patient care equipment when ambulating  Mobility: Assist x1 and Utilize walker, cane, or additional walking aid for ambulation  Cognition: Stay within arm's reach while patient ambulating/toileting/showering  Risk for Moderate/Major Injury: Age: >65 yrs    2. Restraints:  No     Restraint Goal: Patient will be free from injury while physically restrained.  See Docflowsheet for restraint documentation, interventions, education, etc.    Intake and Output:       Intake/Output Summary (Last 24 hours) at 11/22/2023 0417  Last data filed at 11/22/2023 0402  Gross per 24 hour   Intake 682 ml   Output 2050 ml   Net -1368 ml              Last Bowel Movement Date: 11/21/23

## 2023-11-23 LAB — ECG 12-LEAD
Q-T INTERVAL: 406 ms
Q-T INTERVAL: 406 ms
QRS DURATION: 124 ms
QRS DURATION: 128 ms
QTC CALCULATION (BAZETT): 507 ms
QTC CALCULATION (BAZETT): 507 ms
R AXIS: -39 degrees
R AXIS: -40 degrees
T AXIS: 109 degrees
T AXIS: 105 degrees
VENTRICULAR RATE: 94 {beats}/min
VENTRICULAR RATE: 94 {beats}/min

## 2023-11-23 LAB — IRON + BINDING CAPACITY + %SAT+ FERRITIN
~~LOC~~ BKR % SATURATION: 16 % — ABNORMAL LOW (ref 28–42)
~~LOC~~ BKR FERRITIN: 540 ng/mL — ABNORMAL HIGH (ref 30–300)
~~LOC~~ BKR IRON BINDING: 378 ug/dL (ref 270–380)
~~LOC~~ BKR IRON: 61 g/dL (ref 50–185)
~~LOC~~ BKR TRANSFERRIN: 254 mg/dL (ref 185–336)

## 2023-11-23 LAB — HIGH SENSITIVITY TROPONIN I, RANDOM: ~~LOC~~ BKR HIGH SENSITIVITY TROPONIN I: 42 ng/L — ABNORMAL HIGH (ref <20)

## 2023-11-23 LAB — NT-PRO-BNP: ~~LOC~~ BKR NT-PRO-BNP: 216 pg/mL — ABNORMAL HIGH (ref <450)

## 2023-11-23 MED ORDER — GABAPENTIN 100 MG PO CAP
200 mg | Freq: Two times a day (BID) | ORAL | 0 refills | Status: DC
Start: 2023-11-23 — End: 2023-11-27
  Administered 2023-11-24 – 2023-11-27 (×8): 200 mg via ORAL

## 2023-11-23 MED ORDER — BUMETANIDE 1 MG PO TAB
4 mg | Freq: Two times a day (BID) | ORAL | 0 refills | Status: DC
Start: 2023-11-23 — End: 2023-11-24
  Administered 2023-11-23 – 2023-11-24 (×2): 4 mg via ORAL

## 2023-11-23 MED ORDER — ATORVASTATIN 10 MG PO TAB
10 mg | Freq: Every day | ORAL | 0 refills | Status: DC
Start: 2023-11-23 — End: 2023-11-24
  Administered 2023-11-24: 15:00:00 10 mg via ORAL

## 2023-11-23 MED ORDER — ARTIFICIAL TEARS (PF) SINGLE DOSE DROPS GROUP
1 [drp] | OPHTHALMIC | 0 refills | Status: DC | PRN
Start: 2023-11-23 — End: 2023-11-27
  Administered 2023-11-23: 23:00:00 1 [drp] via OPHTHALMIC

## 2023-11-23 NOTE — Progress Notes
 BH 45 END OF SHIFT/ JHFRAT NOTE    Admission Date: 11/20/2023    Acute events, interventions, provider communication: NA    Patient Interventions and Education  Fall Risk/JHFRAT Interventions and Education: (Charting when applicable)  Elimination Interventions : Place male/male urinal within reach   Medications : Stay within arm's reach during toileting/showering (i.e., dizziness, orthostasis)  and Educate patient on medication side effects  Patient Care Equipment: Needs assistance with patient care equipment when ambulating, Ensure environment is free of clutter and walkways are clear from tripping hazards, and Assess need for patient equipment and remove if not in use  Mobility: Assist x1 and Utilize walker, cane, or additional walking aid for ambulation  Cognition: N/A  Risk for Moderate/Major Injury: Age: >65 yrs and Active Anticoagulation    2. Restraints:  No     Restraint Goal: Patient will be free from injury while physically restrained.  See Docflowsheet for restraint documentation, interventions, education, etc.    Intake and Output:       Intake/Output Summary (Last 24 hours) at 11/23/2023 1820  Last data filed at 11/23/2023 1705  Gross per 24 hour   Intake 930 ml   Output 700 ml   Net 230 ml              Last Bowel Movement Date: 11/21/23

## 2023-11-23 NOTE — Consults
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 Station: did not assess  Orientation: clear historian, good insight    Laboratory Review:   CBC w/Diff    Lab Results   Component Value Date/Time    WBC 8.10 11/23/2023 04:28 AM    RBC 4.32 (L) 11/23/2023 04:28 AM    HGB 13.8 11/23/2023 04:28 AM    HCT 41.8 11/23/2023 04:28 AM    MCV 96.7 11/23/2023 04:28 AM    MCH 31.9 11/23/2023 04:28 AM  MCHC 33.0 11/23/2023 04:28 AM    RDW 17.4 (H) 11/23/2023 04:28 AM    PLTCT 163 11/23/2023 04:28 AM    MPV 8.8 11/23/2023 04:28 AM    Lab Results   Component Value Date/Time    NEUT 64.5 11/23/2023 04:28 AM    ANC 5.20 11/23/2023 04:28 AM    LYMA 17.1 (L) 11/23/2023 04:28 AM    ALC 1.40 11/23/2023 04:28 AM    MONA 14.6 (H) 11/23/2023 04:28 AM    AMC 1.20 (H) 11/23/2023 04:28 AM    EOSA 3.2 11/23/2023 04:28 AM    AEC 0.30 11/23/2023 04:28 AM    BASA 0.6 11/23/2023 04:28 AM    ABC 0.00 11/23/2023 04:28 AM         Chemistry    Lab Results   Component Value Date/Time    NA 136 (L) 11/23/2023 04:28 AM    K 4.7 11/23/2023 04:28 AM    CL 87 (L) 11/23/2023 04:28 AM    CO2 41 (H) 11/23/2023 04:28 AM    GAP 8 11/23/2023 04:28 AM    BUN 82 (H) 11/23/2023 04:28 AM    CR 2.76 (H) 11/23/2023 04:28 AM    GLU 124 (H) 11/23/2023 04:28 AM    MG 2.3 11/23/2023 04:28 AM    BNP 113 (H) 06/10/2023 12:00 AM    Lab Results   Component Value Date/Time    CA 8.9 11/23/2023 04:28 AM    PO4 5.0 (H) 11/23/2023 04:28 AM    ALBUMIN 3.6 11/23/2023 04:28 AM    TOTPROT 7.0 11/23/2023 04:28 AM    ALKPHOS 110 11/23/2023 04:28 AM    AST 43 (H) 11/23/2023 04:28 AM    ALT 18 11/23/2023 04:28 AM    TOTBILI 0.6 11/23/2023 04:28 AM    GFR 23 (L) 11/23/2023 04:28 AM    GFRAA >60 02/24/2014 11:39 AM            Renal Function    Lab Results   Component Value Date/Time    NA 136 (L) 11/23/2023 04:28 AM    K 4.7 11/23/2023 04:28 AM    CL 87 (L) 11/23/2023 04:28 AM    CO2 41 (H) 11/23/2023 04:28 AM    GAP 8 11/23/2023 04:28 AM    BUN 82 (H) 11/23/2023 04:28 AM    BUN 66 (H) 11/22/2023 04:22 AM    BUN 60 (H) 11/21/2023 05:01 AM    Lab Results   Component Value Date/Time    CR 2.76 (H) 11/23/2023 04:28 AM    CR 2.51 (H) 11/22/2023 04:22 AM    CR 2.29 (H) 11/21/2023 05:01 AM    GLU 124 (H) 11/23/2023 04:28 AM    CA 8.9 11/23/2023 04:28 AM    PO4 5.0 (H) 11/23/2023 04:28 AM    ALBUMIN 3.6 11/23/2023 04:28 AM        Lipid Profile INR   Lab Results   Component Value Date    CHOL 160 10/06/2023    TRIG 251 (H) 10/06/2023    HDL 27 (L) 10/06/2023    LDL 83 10/06/2023    VLDL 50 (H) 10/06/2023    NONHDLCHOL 139 (H) 05/07/2023    CHOLHDLC 6 (H) 10/06/2023         Lab Results   Component Value Date    INR 1.1 08/08/2021          Chest X-Ray 11/20/2023:   Increased mild diffuse interstitial opacities, likely representing   pulmonary edema.  Tele/ECG: Atrial fibrillation rate controlled heart rate 80s to 100s.    Echocardiogram Details:   Echo Results  (Last 3 results in the past 3 years)      Echo EF LVIDD LVIDS LA Size IVS LVPW Rest PAP    (09/19/22)   40    (11/21/23)   5.3    (11/21/23)   4.0    (11/21/23)   5.7    (11/21/23)   1.5    (11/21/23)   1.4    (09/19/22)   15       (01/29/22)   35    (09/19/22)   5.5    (09/19/22)   4.2    (09/19/22)   6.0    (09/19/22)   1.4    (09/19/22)   1.4    (01/29/22)   31       (09/20/21)   40    (01/29/22)   5.0    (01/29/22)   3.7    (09/20/21)   5.25    (01/29/22)   1.1    (01/29/22)   1.1    (09/20/21)   42

## 2023-11-23 NOTE — Progress Notes
 [facility] 45 END OF SHIFT/ JHFRAT NOTE    Admission Date: 11/20/2023    Acute events, interventions, provider communication: na    Patient Interventions and Education  Fall Risk/JHFRAT Interventions and Education: (Charting when applicable)  Elimination Interventions : Place male/male urinal within reach   Medications : Stay within arm's reach during toileting/showering (i.e., dizziness, orthostasis)   Patient Care Equipment: Needs assistance with patient care equipment when ambulating  Mobility: Assist x1 and Utilize walker, cane, or additional walking aid for ambulation  Cognition: Stay within arm's reach while patient ambulating/toileting/showering  Risk for Moderate/Major Injury: Age: >65 yrs    2. Restraints:  No     Restraint Goal: Patient will be free from injury while physically restrained.  See Docflowsheet for restraint documentation, interventions, education, etc.    Intake and Output:       Intake/Output Summary (Last 24 hours) at 11/23/2023 0347  Last data filed at 11/23/2023 0343  Gross per 24 hour   Intake 1290 ml   Output 1450 ml   Net -160 ml              Last Bowel Movement Date: 11/21/23

## 2023-11-23 NOTE — Progress Notes
 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  Vital signs in the ER showed slight hypoxemia/stable creatinine elevated NT proBNP at 2339.  Chest x-ray obtained on admission showed cardiomegaly/worsening interstitial opacities compared to last chest x-ray.     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 Sandria Manly.  Most recent echo LVH well-seated aortic valve.  Prior to admission medication regimen:  Eliquis, aspirin, Lipitor 20, Jardiance, metolazone every Friday, Toprol 100 daily, Aldactone 25 daily and torsemide 40 mg twice daily.  - The patient is presenting with bilateral lower extremity edema fluid overload/shortness of breath.  His weight now is up to 303, he says he was as low as 280 when he was dry  -Patient does not know the majority of his medications, he lives in an assisted living facility and the medications are often just brought to them and he also feels like there is not much flexibility in his overall meals  -2D echocardiogram had poor visualization of the LV, EF was estimated at 48% there is severely dilated LA and RA  Plan:  > Cardiology was consulted, had signed off on 3/1 with recommendation transition to p.o. diuresis on 3/2.  However, with Cr increased had given a diuretic holiday. Weight has increased steadily and Cardiology re-engaged on 3/3 and have placed on PO diuretic  - Cardiogram completed, results pending  - Continue Jardiance/Toprol  > Hold ladactone due to AKI     Paroxysmal atrial fibrillation  Atypical chest pain -intermittent for several months  - Continue anticoagulation with Eliquis, monitor on telemetry  -Continue PTA metoprolol  - Last ischemic evaluation was in November 2022 when he was noted to have nonobstructive coronary disease and medical management was recommended  - Reports atypical chest pain that is described of left-sided chest tightness as well as dyspnea this is atypical in the sense that this also is reproducible at times when he presses on his left chest  - Will discuss with cardiology if worth pursuing an ishcemic evaluation due to on going complaints of chest pain     COPD stage  Acute on chronic hypoxemic respiratory failure  -  Baseline is 2 L of oxygen,, presentation did require 3 to 4 L transiently suspected likely secondary to fluid overload.  Did improve back to 2 L with diuresis  - No wheezing on exam  - Duonebs PRN     AKI on CKD stage III  - Will monitor Cr with diuresis, baseline creatinine approximately 1.8-2, Cr increased to 2.5 on 3/2  - Continue Gabapentin for neuropathy  - Hold diuresis, and aldactone on 3/2     Mood disorder  - Continue Depakote and sertraline     Hypothyroidism-continue PTA Synthroid  BPH-continue PTA tamsulosin       Code: DNAR-FI, see ACP note from 3/1    Dispo: Continued patient hospitalization    Patient was discussed at multidisciplinary rounds which included nurse, pharmacist, social work, and nurse case management who provided additional information that affected the patient's care.    High medical decision making due to the following:  drug therapy requiring intensive monitoring for toxicity, IV controlled substances, and decision regarding hospitalization     Note to patient: The 21st Century Cures Act makes medical notes like these available to patients in the interest of transparency. However, be advised this is a medical document. It is intended as peer to peer communication. It is written in medical language and may contain abbreviations or verbiage that are unfamiliar. It may appear blunt or direct. Medical documents are intended to carry relevant information, facts as evident, and the clinical opinion of the practitioner.    Clearence Ped, DO  Pager [phone]    __________________________________________________________________________________  Subjective: Jacob Dickerson is a 78 y.o. male no acute events overnight, this morning he endorses chest pain it is left sided, squeezing. He episode yesterday as well and says he has it all the time but worse at certain times. He reports it was not worse with meals yesterday. No nausea, emesis.     Medications:  Scheduled Meds:allopurinoL (ZYLOPRIM) tablet 100 mg, 100 mg, Oral, BID  apixaban (ELIQUIS) tablet 5 mg, 5 mg, Oral, BID  [START ON 11/24/2023] atorvastatin (LIPITOR) tablet 10 mg, 10 mg, Oral, QDAY  bumetanide (BUMEX) tablet 4 mg, 4 mg, Oral, BID(9-17)  divalproex (DEPAKOTE) DR tablet 750 mg, 750 mg, Oral, QHS  empagliflozin (JARDIANCE) tablet 10 mg, 10 mg, Oral, QDAY  gabapentin (NEURONTIN) capsule 200 mg, 200 mg, Oral, BID  levothyroxine (SYNTHROID) tablet 75 mcg, 75 mcg, Oral, QDAY 30 min before breakfast  lidocaine (LIDODERM) 5 % topical patch 2 patch, 2 patch, Topical, QDAY  metoprolol succinate XL (TOPROL XL) tablet 100 mg, 100 mg, Oral, QDAY  sertraline (ZOLOFT) tablet 50 mg, 50 mg, Oral, QAM8  [Held by Provider] spironolactone (ALDACTONE) tablet 25 mg, 25 mg, Oral, QDAY  tamsulosin (FLOMAX) capsule 0.4 mg, 0.4 mg, Oral, QAM8    Continuous Infusions:  PRN and Respiratory Meds:acetaminophen Q6H PRN, alum-mag hydroxide-simeth Q4H PRN **AND** lidocaine hcl viscous Q4H PRN, eucalyptus-menthoL Q2H PRN, fluticasone propionate BID PRN, melatonin QHS PRN, methocarbamoL Q8H PRN, ondansetron Q6H PRN **OR** ondansetron (ZOFRAN) IV Q6H PRN, polyethylene glycol 3350 QDAY PRN, sennosides-docusate sodium QDAY PRN      Physical Exam:  Vital Signs: Last Filed In 24 Hours Vital Signs: 24 Hour Range   BP: 133/62 (03/03 1146)  Temp: 36.7 ?C (98.1 ?F) (03/03 1146)  Pulse: 95 (03/03 1146)  Respirations: 18 PER MINUTE (03/03 1146)  SpO2: 93 % (03/03 1146)  O2 Device: None (Room air) (03/03 1146)  O2 Liter Flow: 2 Lpm (03/03 0823) BP: (115-133)/(55-78)  Temp:  [36.3 ?C (97.3 ?F)-36.7 ?C (98.1 ?F)]   Pulse:  [94-101]   Respirations:  [18 PER MINUTE-19 PER MINUTE]   SpO2:  [92 %-96 %]   O2 Device: None (Room air)  O2 Liter Flow: 2 Lpm   Intensity Pain Scale (Self Report): 5 (11/23/23 0745)      General:  Alert, cooperative, no distress, appears stated age  Neck: Unable to appreciate JVD due to body habitus  Lungs:  Clear to auscultation bilaterally  Chest wall: Chest pain elicited with palpation in the left chest wall  Heart:  RRR, S1, S2, no other murmurs  Abdomen:  (+)BS, soft, nt/nd  Extremities: 1+ lower extremity edema    Lab/Radiology/Other Diagnostic Tests:  24-hour labs:    Results for orders placed or performed during the hospital encounter of 11/20/23 (from the past 24 hours)   HIGH SENSITIVITY TROPONIN I, RANDOM    Collection Time: 11/22/23  3:18 PM   Result Value Ref Range    hs Troponin I, Random 36 (H) <20 ng/L   HIGH SENSITIVITY TROPONIN I, RANDOM    Collection Time: 11/22/23  7:00 PM   Result Value Ref Range    hs Troponin I, Random 42 (H) <20 ng/L   CBC AND DIFF    Collection Time: 11/23/23  4:28 AM   Result Value Ref Range    White Blood Cells 8.10 4.50 - 11.00 10*3/uL    Red Blood Cells 4.32 (L) 4.40 - 5.50 10*6/uL    Hemoglobin 13.8 13.5 - 16.5 g/dL    Hematocrit 29.5 62.1 - 50.0 %    MCV 96.7 80.0 - 100.0 fL    MCH 31.9 26.0 - 34.0 pg    MCHC 33.0 32.0 - 36.0 g/dL    RDW 30.8 (H) 65.7 - 15.0 %    Platelet Count 163 150 - 400 10*3/uL    MPV 8.8 7.0 - 11.0 fL    Neutrophils 64.5 41.0 - 77.0 %    Lymphocytes 17.1 (L) 24.0 - 44.0 %    Monocytes 14.6 (H) 4.0 - 12.0 %    Eosinophils 3.2 0.0 - 5.0 %    Basophils 0.6 0.0 - 2.0 %    Absolute Neutrophil Count 5.20 1.80 - 7.00 10*3/uL    Absolute Lymph Count 1.40 1.00 - 4.80 10*3/uL    Absolute Monocyte Count 1.20 (H) 0.00 - 0.80 10*3/uL    Absolute Eosinophil Count 0.30 0.00 - 0.45 10*3/uL    Absolute Basophil Count 0.00 0.00 - 0.20 10*3/uL   COMPREHENSIVE METABOLIC PANEL    Collection Time: 11/23/23  4:28 AM   Result Value Ref Range    Sodium 136 (L) 137 - 147 mmol/L    Potassium 4.7 3.5 - 5.1 mmol/L    Chloride 87 (L) 98 - 110 mmol/L    Glucose 124 (H) 70 - 100 mg/dL    Blood Urea Nitrogen 82 (H) 7 - 25 mg/dL    Creatinine 8.46 (H) 0.40 - 1.24 mg/dL    Calcium 8.9 8.5 - 96.2 mg/dL    Total Protein 7.0 6.0 - 8.0 g/dL    Total Bilirubin 0.6 0.2 - 1.3 mg/dL    Albumin 3.6 3.5 - 5.0 g/dL    Alk Phosphatase 952 25 - 110 U/L    AST 43 (H) 7 - 40 U/L    ALT 18 7 - 56 U/L    CO2 41 (H) 21 - 30 mmol/L    Anion Gap 8 3 - 12  Glomerular Filtration Rate (GFR) 23 (L) >60 mL/min   MAGNESIUM    Collection Time: 11/23/23  4:28 AM   Result Value Ref Range    Magnesium 2.3 1.6 - 2.6 mg/dL   PHOSPHORUS    Collection Time: 11/23/23  4:28 AM   Result Value Ref Range    Phosphorus 5.0 (H) 2.0 - 4.5 mg/dL   NT-PRO-BNP    Collection Time: 11/23/23  4:28 AM   Result Value Ref Range    NT-Pro-BNP 2,169 (H) <450 pg/mL   IRON + BINDING CAPACITY + %SAT+ FERRITIN    Collection Time: 11/23/23  4:28 AM   Result Value Ref Range    Iron 61 50 - 185 ?g/dL    Iron Binding-TIBC 454 270 - 380 mcg/dL    % Saturation 16 (L) 28 - 42 %    Transferrin 254 185 - 336 mg/dL    Ferritin 098 (H) 30 - 300 ng/mL     Glucose: (!) 124 (11/23/23 0428)  Pertinent radiology reviewed.    Sydnee Levans, DO  Pager [phone]

## 2023-11-23 NOTE — Progress Notes
 OCCUPATIONAL THERAPY  NOTE     Name: Jacob Dickerson   MRN: [id number]     DOB: 10/09/1945      Age: 78 y.o.  Admission Date: 11/20/2023     LOS: 3 days     Date of Service: 11/23/2023    Based on discussion with PT, patient's current level of function suggests that patient will progress with one discipline. OT will sign off at this time. Please re-consult if a change in functional status occurs.    Therapist: Honor Loh, OTR/L [id number]  Date: 11/23/2023

## 2023-11-24 ENCOUNTER — Inpatient Hospital Stay: Admit: 2023-11-24 | Discharge: 2023-11-25 | Payer: MEDICARE

## 2023-11-24 LAB — ECG 12-LEAD
Q-T INTERVAL: 384 ms
QRS DURATION: 124 ms
QTC CALCULATION (BAZETT): 510 ms
R AXIS: -12 degrees
T AXIS: 88 degrees
VENTRICULAR RATE: 106 {beats}/min

## 2023-11-24 MED ORDER — ATORVASTATIN 20 MG PO TAB
20 mg | Freq: Every day | ORAL | 0 refills | Status: DC
Start: 2023-11-24 — End: 2023-11-27
  Administered 2023-11-25 – 2023-11-27 (×3): 20 mg via ORAL

## 2023-11-24 MED ORDER — SODIUM CHLORIDE 0.9% IV SOLP
250 mL | Freq: Once | INTRAVENOUS | 0 refills | Status: CP
Start: 2023-11-24 — End: ?
  Administered 2023-11-24: 19:00:00 250 mL via INTRAVENOUS

## 2023-11-24 MED ORDER — EZETIMIBE 10 MG PO TAB
10 mg | Freq: Every day | ORAL | 0 refills | Status: DC
Start: 2023-11-24 — End: 2023-11-27
  Administered 2023-11-24 – 2023-11-27 (×4): 10 mg via ORAL

## 2023-11-24 NOTE — Progress Notes
 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  Vital signs in the ER showed slight hypoxemia/stable creatinine elevated NT proBNP at 2339.  Chest x-ray obtained on admission showed cardiomegaly/worsening interstitial opacities compared to last chest x-ray.     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 Sandria Manly.  Most recent echo LVH well-seated aortic valve.  Prior to admission medication regimen:  Eliquis, aspirin, Lipitor 20, Jardiance, metolazone every Friday, Toprol 100 daily, Aldactone 25 daily and torsemide 40 mg twice daily.  - The patient is presenting with bilateral lower extremity edema fluid overload/shortness of breath.  His weight now is up to 303, he says he was as low as 280 when he was dry  -Patient does not know the majority of his medications, he lives in an assisted living facility and the medications are often just brought to them and he also feels like there is not much flexibility in his overall meals  -2D echocardiogram had poor visualization of the LV, EF was estimated at 48% there is severely dilated LA and RA  Plan:  > Cardiology was consulted, had signed off on 3/1 with recommendation transition to p.o. diuresis on 3/2.  However, with Cr increased had given a diuretic holiday. Weight has increased steadily and Cardiology re-engaged on 3/3 and have placed on PO diuretic, his creatinine continued to increase so diuretic once again held on 3/4  > Cards ordered gentle IVF x 1 on 3/4  - Continue Jardiance/Toprol  > Hold ladactone due to AKI    AKI on CKD stage III  - Will monitor Cr with diuresis, baseline creatinine approximately 1.8-2, Cr increased to 2.5 on 3/2  - Continue Gabapentin for neuropathy  - Hold diuresis, and aldactone      Paroxysmal atrial fibrillation  Atypical chest pain -intermittent for several months  - Continue anticoagulation with Eliquis, monitor on telemetry  - Continue PTA metoprolol  - Last ischemic evaluation was in November 2022 when he was noted to have nonobstructive coronary disease and medical management was recommended  - Reports atypical chest pain that is described of left-sided chest tightness as well as dyspnea  this is atypical in the sense that this also is reproducible at times when he presses on his left chest  - Cardiology follow-up given atypical nature of the chest pain do not suspect ischemic etiology     COPD stage  Acute on chronic hypoxemic respiratory failure  - Baseline is 2 L of oxygen,, presentation did require 3 to 4 L transiently suspected likely secondary to fluid overload.  Did improve back to 2 L with diuresis  - No wheezing on exam  - Duonebs PRN     Mood disorder  - Continue Depakote and sertraline     Hypothyroidism-continue PTA Synthroid  BPH-continue PTA tamsulosin     Code: DNAR-FI, see ACP note from 3/1    Dispo: Continued patient hospitalization    Patient was discussed at multidisciplinary rounds which included nurse, pharmacist, social work, and nurse case management who provided additional information that affected the patient's care.    High medical decision making due to the following:  drug therapy requiring intensive monitoring for toxicity, IV controlled substances, and decision regarding hospitalization     Note to patient: The 21st Century Cures Act makes medical notes like these available to patients in the interest of transparency. However, be advised this is a medical document. It is intended as peer to peer communication. It is written in medical language and may contain abbreviations or verbiage that are unfamiliar. It may appear blunt or direct. Medical documents are intended to carry relevant information, facts as evident, and the clinical opinion of the practitioner.    Clearence Ped, DO  Pager [phone]    __________________________________________________________________________________  Subjective: Jacob Dickerson is a 78 y.o. male no acute events overnight, this morning he is not experiencing chest pain.  Breathing wise he has no complaints continues to have lower extremity swelling.    Medications:  Scheduled Meds:allopurinoL (ZYLOPRIM) tablet 100 mg, 100 mg, Oral, BID  apixaban (ELIQUIS) tablet 5 mg, 5 mg, Oral, BID  [START ON 11/25/2023] atorvastatin (LIPITOR) tablet 20 mg, 20 mg, Oral, QDAY  divalproex (DEPAKOTE) DR tablet 750 mg, 750 mg, Oral, QHS  empagliflozin (JARDIANCE) tablet 10 mg, 10 mg, Oral, QDAY  ezetimibe (ZETIA) tablet 10 mg, 10 mg, Oral, QDAY  gabapentin (NEURONTIN) capsule 200 mg, 200 mg, Oral, BID  levothyroxine (SYNTHROID) tablet 75 mcg, 75 mcg, Oral, QDAY 30 min before breakfast  lidocaine (LIDODERM) 5 % topical patch 2 patch, 2 patch, Topical, QDAY  metoprolol succinate XL (TOPROL XL) tablet 100 mg, 100 mg, Oral, QDAY  sertraline (ZOLOFT) tablet 50 mg, 50 mg, Oral, QAM8  [Held by Provider] spironolactone (ALDACTONE) tablet 25 mg, 25 mg, Oral, QDAY  tamsulosin (FLOMAX) capsule 0.4 mg, 0.4 mg, Oral, QAM8    Continuous Infusions:  PRN and Respiratory Meds:acetaminophen Q6H PRN, alum-mag hydroxide-simeth Q4H PRN **AND** lidocaine hcl viscous Q4H PRN, artificial tears (PF) single dose PRN, eucalyptus-menthoL Q2H PRN, fluticasone propionate BID PRN, melatonin QHS PRN, methocarbamoL Q8H PRN, ondansetron Q6H PRN **OR** ondansetron (ZOFRAN) IV Q6H PRN, polyethylene glycol 3350 QDAY PRN, sennosides-docusate sodium QDAY PRN      Physical Exam:  Vital Signs: Last Filed In 24 Hours Vital Signs: 24 Hour Range   BP: 94/63 (03/04 1144)  Temp: 36.9 ?C (98.4 ?F) (03/04 1144)  Pulse: 100 (03/04 1144)  Respirations: 18 PER MINUTE (03/04 1144)  SpO2: 94 % (03/04 1144)  O2 Device: Nasal cannula (03/04 1144)  O2 Liter Flow: 2 Lpm (03/04 1144) BP: (85-133)/(54-79)   Temp:  [36.3 ?C (97.3 ?  F)-36.9 ?C (98.5 ?F)]   Pulse:  [82-100]   Respirations:  [18 PER MINUTE]   SpO2:  [92 %-95 %]   O2 Device: Nasal cannula  O2 Liter Flow: 2 Lpm   Intensity Pain Scale (Self Report): 10 (11/24/23 0445)      General:  Alert, cooperative, no distress, appears stated age  Neck: Unable to appreciate JVD due to body habitus  Lungs:  Clear to auscultation bilaterally  Chest wall: Chest pain elicited with palpation in the left chest wall  Heart:  RRR, S1, S2, no other murmurs  Abdomen:  (+)BS, soft, nt/nd  Extremities: 1+ lower extremity edema    Lab/Radiology/Other Diagnostic Tests:  24-hour labs:    Results for orders placed or performed during the hospital encounter of 11/20/23 (from the past 24 hours)   CBC AND DIFF    Collection Time: 11/24/23  5:54 AM   Result Value Ref Range    White Blood Cells 7.80 4.50 - 11.00 10*3/uL    Red Blood Cells 4.29 (L) 4.40 - 5.50 10*6/uL    Hemoglobin 13.8 13.5 - 16.5 g/dL    Hematocrit 57.8 46.9 - 50.0 %    MCV 95.0 80.0 - 100.0 fL    MCH 32.1 26.0 - 34.0 pg    MCHC 33.8 32.0 - 36.0 g/dL    RDW 62.9 (H) 52.8 - 15.0 %    Platelet Count 160 150 - 400 10*3/uL    MPV 8.9 7.0 - 11.0 fL    Neutrophils 68.5 41.0 - 77.0 % Lymphocytes 16.8 (L) 24.0 - 44.0 %    Monocytes 11.6 4.0 - 12.0 %    Eosinophils 2.2 0.0 - 5.0 %    Basophils 0.9 0.0 - 2.0 %    Absolute Neutrophil Count 5.30 1.80 - 7.00 10*3/uL    Absolute Lymph Count 1.30 1.00 - 4.80 10*3/uL    Absolute Monocyte Count 0.90 (H) 0.00 - 0.80 10*3/uL    Absolute Eosinophil Count 0.20 0.00 - 0.45 10*3/uL    Absolute Basophil Count 0.10 0.00 - 0.20 10*3/uL   COMPREHENSIVE METABOLIC PANEL    Collection Time: 11/24/23  5:54 AM   Result Value Ref Range    Sodium 138 137 - 147 mmol/L    Potassium 4.2 3.5 - 5.1 mmol/L    Chloride 87 (L) 98 - 110 mmol/L    Glucose 122 (H) 70 - 100 mg/dL    Blood Urea Nitrogen 94 (H) 7 - 25 mg/dL    Creatinine 4.13 (H) 0.40 - 1.24 mg/dL    Calcium 9.2 8.5 - 24.4 mg/dL    Total Protein 7.1 6.0 - 8.0 g/dL    Total Bilirubin 0.5 0.2 - 1.3 mg/dL    Albumin 3.6 3.5 - 5.0 g/dL    Alk Phosphatase 010 25 - 110 U/L    AST 30 7 - 40 U/L    ALT 17 7 - 56 U/L    CO2 41 (H) 21 - 30 mmol/L    Anion Gap 10 3 - 12    Glomerular Filtration Rate (GFR) 21 (L) >60 mL/min   MAGNESIUM    Collection Time: 11/24/23  5:54 AM   Result Value Ref Range    Magnesium 2.1 1.6 - 2.6 mg/dL   PHOSPHORUS    Collection Time: 11/24/23  5:54 AM   Result Value Ref Range    Phosphorus 4.7 (H) 2.0 - 4.5 mg/dL   LIPID PROFILE    Collection Time: 11/24/23  5:54 AM  Result Value Ref Range    Cholesterol 121 <200 mg/dL    Triglycerides 161 (H) <150 mg/dL    HDL 26 (L) >09 mg/dL    LDL 72 <604 mg/dL    VLDL 54.0 mg/dL    Non HDL Cholesterol 95 mg/dL     Glucose: (!) 981 (19/14/78 0554)  Pertinent radiology reviewed.    Sydnee Levans, DO  Pager [phone]

## 2023-11-24 NOTE — Progress Notes
 Float Pool END OF SHIFT/ JHFRAT NOTE    Admission Date: 11/20/2023    Acute events, interventions, provider communication: n/a    Patient Interventions and Education  Fall Risk/JHFRAT Interventions and Education: (Charting when applicable)  Elimination Interventions : N/A  Medications : Use of gait belt , Stay within arm's reach during toileting/showering (i.e., dizziness, orthostasis) , and Educate patient on medication side effects  Patient Care Equipment: Needs assistance with patient care equipment when ambulating and Ensure environment is free of clutter and walkways are clear from tripping hazards  Mobility: Assist x1, Gait belt in use when ambulating, and Utilize walker, cane, or additional walking aid for ambulation  Cognition: N/A  Risk for Moderate/Major Injury: Age: >65 yrs    2. Restraints:  No     Restraint Goal: Patient will be free from injury while physically restrained.  See Docflowsheet for restraint documentation, interventions, education, etc.    Intake and Output:       Intake/Output Summary (Last 24 hours) at 11/24/2023 1818  Last data filed at 11/24/2023 1632  Gross per 24 hour   Intake 900 ml   Output 2150 ml   Net -1250 ml              Last Bowel Movement Date: 11/24/23

## 2023-11-24 NOTE — Care Plan
 Problem: Discharge Planning  Goal: Participation in plan of care  Outcome: Goal Ongoing  Flowsheets (Taken 11/24/2023 0940)  Participation in Plan of Care: Involve patient/caregiver in care planning decision making  Goal: Knowledge regarding plan of care  Outcome: Goal Ongoing  Flowsheets (Taken 11/24/2023 0940)  Knowledge regarding plan of care:   Provide infection prevention education   Provide VTE signs and symptoms education   Provide plan of care education   Provide fall prevention education   Provide medication management education  Goal: Prepared for discharge  Outcome: Goal Ongoing  Flowsheets (Taken 11/24/2023 0940)  Prepared for discharge:   Complete ADL ability assessment   Provide safe use medical equipment education   Collaborate with multidisciplinary team for hospital discharge coordination   Provide diet and oral health education     Problem: High Fall Risk  Goal: High Fall Risk  Outcome: Goal Ongoing  Flowsheets (Taken 11/24/2023 0940)  High Fall Risk:   All patients will receive: High fall risk sign, yellow wristband, yellow socks, gait belt, and shower shoes   Engage bed alarm - middle setting   Engage chair alarm   Stay with the patient while toileting/showering   PT/OT consult for fall prevention assessment if scoring in Unsteady Gait or Visual or Auditory impairment   Remove excess equipment/supplies   Educate patient to use call-light if tethered   Maximize bed functionality (optimize bed height, firm/flat surface)   Use safe patient handling equipment as appropriate     Problem: Pain  Goal: Management of pain  Outcome: Goal Ongoing  Flowsheets (Taken 11/24/2023 0940)  Management of pain:   Complete pain assessment scale according to age, condition and ability to understand.   In the patient who can fully report pain, assess pain characteristics.   Manage pain.   Assess opioid analgesia side-effects.   Assess pain control barriers.  Goal: Knowledge of pain management  Outcome: Goal Ongoing  Flowsheets (Taken 11/24/2023 0940)  Knowledge of pain management:   Provide pain scale education   Provide pharmacological pain management education   Provide pain management methods education  Goal: Progress Toward Pain Management Goals  Outcome: Goal Ongoing  Flowsheets (Taken 11/24/2023 0940)  Progress toward pain management goals:   Progress toward pain management goals   Assess progress toward pain management goals     Problem: Fluid Volume, Imbalanced  Goal: Absence of dehydration  Outcome: Goal Ongoing  Flowsheets (Taken 11/24/2023 0940)  Absence of dehydration:   Assess for signs and symptoms of dehydration   Assess fluid loss   Monitor intake and output   Provide signs and symptoms of dehydration education     Problem: Moderate Fall Risk  Goal: Moderate Fall Risk  Outcome: Goal Ongoing  Flowsheets (Taken 11/24/2023 0940)  Moderate Fall Risk:   All patients will receive: High fall risk sign, yellow wristband, yellow socks, gait belt, and shower shoes   Engage bed/chair alarm   Stay with patient while toileting/showering   PT/OT consult for fall prevention assessment if scoring in Unsteady Gait or Visual or Auditory impairment   Remove excess equipment/supplies   Educate patient to use call-light if tethered   Maximize bed functionality (optimize bed height, firm/flat surface)   Use safe patient handling equipment as appropriate

## 2023-11-24 NOTE — Progress Notes
 Heart Failure Progress Note    NAME:Jacob Dickerson             MRN: [id number]                 DOB:23-Nov-1945          AGE: 78 y.o.  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 male was admitted on 11/20/2023 for acute on chronic heart failure exacerbation.    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     He was seen by Dr. Glean Hess and cardiology fellow Dr. Allena Katz over the weekend.  They recommended resuming his torsemide 40 mg twice daily and increasing metolazone to 5 mg 3 times a week was previously taking once weekly on Fridays.  Primary team is continue to hold diuretics due to worsening creatinine today 2.76 with baseline around 2.2.  Heart failure consult team was reengaged for further instruction regarding diuretics. Mr. Kronberg follows with Dr. Sandria Manly from cardiology team. [place] at assisted living [place]     PTA Medications: Atorvastatin 10 mg daily, Eliquis 5 mg twice daily, spironolactone 25 mg daily, Jardiance 10 mg daily, torsemide 40 mg BID, Metolazone 5 mg once/weekly, potassium chloride 40 mEq BID with an extra 20 mEq on metolazone days.    Recommendations 11/24/23:  Diuretics: PO Bumex 4 mg x1 this AM; will hold this afternoon's dose and also give 250 mL 0.9% NS x1 with AKI  HFrEF GDMT: Continue PTA Toprol XL 100 mg daily + Jardiance 10 mg daily; Spironolactone currently on hold with AKI  AFib: continue PTA Eliquis 5 mg BID   Iron panel indicates pt will not qualify for IV iron replacement  HLD: will increase Atorvastatin to 20 mg daily and also add Zetia 10 mg daily (Trigs 273, HDL 26, LDL 72) to optimize  Dietician consulted to review low sodium diet and fluid restrictions with patient   Encourage activity as tolerated   Pt has PCP appointment scheduled on 12/02/23  Will continue to follow along     Ongoing:  BMP once a day. Magnesium level daily.  Keep Potassium greater than 4.0 and Magnesium greater than 2.0.  2000mg  sodium dietary restriction.  Fluid Restriction:2L  Strict I/O. Goal output: net neg 2-3L/24 hour  Daily standing scale weight. Goal Dry Weight: ~290 lbs        Primary team responsible for placing Post-Discharge [facility] Appointment Request order set for Cardiology: Heart Failure appointment request within 24-48 hours of discharge. (Please do not place order any earlier in effort to reduce possible need for cancellation and rescheduling of visit).      Pt seen and examined; discussed  with Dr. Wallie Char      Sherle Poe, APRN-NP  Heart Failure Consult Service   Available on Voalte      Assessment:   Acute on Chronic Systolic HFimpEF EF 45% on 11/21/23    -Echo on 09/19/22 showed an EF of 40% (01/2022 EF 35%, 07/2021 50%).   Major Complications or Comorbidities [facility]): acute/ acute on chronic systolic and/or diastolic heart failure  NYHA functional class III (marked limitation of physical activity - comfortable at rest, but less than ordinary activity causes symptoms of HF e.g., getting dressed or standing from a sitting position),   ACC Stage C (structural heart disease with prior or current symptoms of HF).   He presents with signs of hypervolemia with left  ventricular failure without signs of low flow state.    Lab Results   Component Value Date    HSTROP0HR 41 (H) 02/21/2022    HSTROP2HR 37 (H) 02/21/2022    HSTROP4HR 36 (H) 02/21/2022    HIGHSTROPI 42 (H) 11/22/2023       BNP Labs:   Lab Results   Component Value Date    NTPROBNP 2,169 (H) 11/23/2023    NTPROBNP 2,339 (H) 11/20/2023    NTPROBNP 2,149.0 (H) 04/08/2023          Admission Weight: (!) 138.8 kg (305 lb 14.4 oz)        Most recent weights (inpatient):   Vitals:    11/22/23 0524 11/23/23 1000 11/24/23 0441   Weight: 134.7 kg (297 lb) 135.5 kg (298 lb 12.8 oz) 135.1 kg (297 lb 13.5 oz)        Strict I/Os:   Total Stay Net: -3.7 L  Last 24 hrs Net: -725 mL  UOP in last 24 hrs: 975 mL       Diuretic Therapy    Prior to admission dose Torsemide 40 mg po bid + metolazone 5 mg weekly    Given on admission Bumex 4 mg IV    Daily Dosing 3/1: Bumex 4 mg IV bid  3/2-3/3 AM: diuretic holiday.  3/3: Bumex 4 mg po x1  3/4: Bumex 4 mg po x1   Rec 3/1 to cont torsemide 40 mg bid and increase metolazone to 5 mg MWF (vs weekly Friday) previously     Guideline Directed Medical Therapy PTA Inpatient Changes   ACEI/ARB/ARNI CKD    BB Toprol-XL 100 mg daily cont   SGLT-2 Inhibitor Jardiance 10 mg daily  cont   Mineralocorticoid Receptor Antagonist Spironolactone 25 mg po daily Held with creat rising; consider resumption daily   Hydralazine/Nitrate No EF>40%    Ivabradine No; EF>35% and Not in sinus rhythm    Heart Rhythm Management Therapy No (EF>35%)    Anticoagulation for Afib/flutter Permanent A-fib on Eliquis    Cardiac Rehab Evaluation for LVEF < or equal to 40%  *Referral good for up to a year No; EF>40%    7-Day post hospital follow up scheduled within 24-48 hours of discharge No Discharge not expected within the next 24-48 hours        Most Recent Cardiac Testing/Procedures     ECHO 11/21/23:    Ventricle not well seen. The left ventricular size is normal. Moderate concentric hypertrophy. The left ventricular systolic function is mildly reduced. Beat-to-beat variation with A-fib. The ejection fraction by Simpson's biplane method is 48%. Abnormal septal motion. Left ventricular diastolic dysfunction. Unable to assess left atrial pressure.    Right Ventricle: The right ventricle is mildly dilated.  The right ventricular systolic function is normal.    Left Atrium: Severely dilated. Right Atrium: Severely dilated.    There is a 26 mm Edwards SAPIEN bioprosthetic valve present.  The valve is not well-visualized.  Appears well-seated mean gradient of 7 mmHg unchanged compared to prior study dated 12//2023 no regurgitation.    Technically difficult study with poor visualization, interpretation accuracy could be affected.       Transthoracic echocardiogram 09/19/2022    Technically difficult study.    The left ventricular size is normal. Wall thickness is increased. Mild concentric hypertrophy. The left ventricular systolic function is moderately reduced. The visually estimated ejection fraction is 40%. Abnormal septal motion consistent with left bundle branch block.    The right ventricular size is normal. The right ventricular systolic function is normal.    Left Atrium: Mildly dilated. Right Atrium: Moderately dilated.    Aortic Valve: There is a 26 mm Edwards SAPIEN bioprosthetic valve present. The prosthetic valve is normal. No stenosis.  MG = 7 mmHg, DVI = 0.5.  No regurgitation.    The ascending aorta is moderately dilated.    No pericardial effusion.    Compared with study dated 01/29/2022, no significant change is noted    01/29/22 Echo:  The left ventricle is normal in size, moderately reduced function, estimate ejection fraction 35%.  Global hypokinesis pattern, very difficult endocardial definition despite use of echo contrast, regional wall motion assessment difficult  Unable to assess diastolic function given abnormal underlying rhythm, atrial fibrillation.  The right ventricle is mildly dilated, function is probably normal.  Severe biatrial enlargement  There is a 26 mm Edwards SAPIEN bioprosthesis in aortic position, normal function, no stenosis, no regurgitation, mean gradient 10 mmHg  The estimated PA systolic pressure is 31 mmHg  Moderately dilated ascending aorta     Comparison is made to prior study of 09/20/21.  EF by Simpson's biplane on previous study 38%, LV function appears normal similar.     02/28/22 RHC:  RA pressure 4 mmHg with a V-wave of 7 mmHg.  RV pressure 23/2 mmHg with an RVEDP of 4 mmHg.  PA pressure 23/13 mmHg with a mean PA pressure of 16 mmHg.  Pulmonary capillary wedge pressure of 5 mmHg with a V-wave of 6 mmHg.  Transpulmonary gradient of 11 mmHg.  PA sat 65%, RA sat 65%, pulse ox 94%.  Blood pressure 98/66 mmHg and heart rate 77 beats per minute.  Body surface area 2.27 and hemoglobin 15.9.  Cardiac output by Fick 4.52 L/minute and by thermo 3.4 L/minute.  Cardiac index by Fick 2.0 L/minute/m2 and by thermo 1.5 L/minutes/m2.  PVR by Fick was 2.4.  PVR by thermo was 3.2.     07/29/21 Right and Left heart cath:     HEMODYNAMICS OF RIGHT HEART CATHETERIZATION:    Body surface area 2.20 m2.  Hemoglobin percent is 12 grams/dL.  Blood pressure 123/58 mmHg with a mean of 83 mmHg.  Heart rate 75 beats per minute.     SATURATIONS:    Aorta saturation 99%.  Right atrial saturation 66%.  Pulmonary artery saturation 68%.     PRESSURES:    Right atrial pressure 8 mmHg.  Right ventricular pressure 26/8 mmHg.  Pulmonary artery pressure 31/13 mmHg with a mean of 19 mmHg.  Pulmonary capillary wedge pressure of 13 mmHg.  Transpulmonary gradient of 6 mmHg.  Diastolic pulmonary gradient of 0 mmHg.  Cardiac output by Fick method was 5.47 L/minute and by  thermodilution technique was 4.75 L/minute.  Cardiac index by Fick method was 2.47 L/minute per sq m and by thermodilution technique was 2.16 L/minute per sq m.  Pulmonary vascular resistance was 1.09 Woods unit.  Systemic vascular resistance was 1097 dynes.second.centimeter 5.     HEMODYNAMICS OF LEFT HEART CATHETERIZATION:  The left ventricular systolic pressure was 106 mmHg.  The aortic pressure was 75/33 mmHg.  The gradient across the aortic valve was 31 mmHg, which was suggestive of moderate aortic stenosis.  The left ventricular end-diastolic pressure was 12 mmHg.     SELECTIVE CORONARY ANGIOGRAPHY:    Left main coronary artery:  The left main coronary artery arises normally from the left coronary sinus.  The left main artery bifurcates into the left anterior descending artery and left circumflex artery.  The left main coronary artery is free of angiographically significant disease.  Left anterior descending artery:  The left anterior descending artery is a large caliber vessel.  The mid LAD has a focal area of 30% stenosis after the 1st diagonal branch.  This is a type 3 LAD.  It gives rise to a medium caliber diagonal branch which gives rise to a superior inferior branch which is free of angiographically significant disease.  Left circumflex artery:  The left circumflex artery is a large caliber vessel which gives rise to a high 1st obtuse marginal branch followed by two additional obtuse marginal branches.  The left circumflex artery as well as the branches are free of angiographically significant disease.  Right coronary artery:  The right coronary artery is a large caliber dominant vessel which arises normally from the right coronary sinus.  The right coronary artery distally bifurcates into the right posterior descending artery and right posterolateral branch.  The right coronary as well as its branches are free of angiographically significant disease.     FINAL IMPRESSION:  Normal right and left-sided filling pressures.  Normal cardiac output and cardiac index by Fick method.  The gradient across the aortic valve was 31 mmHg, which is suggestive of moderate aortic stenosis.  Mild nonobstructive coronary artery disease as described above.    The left ventricular end-diastolic pressure was 12 mmHg.     07/17/21 PYP scan:  This study is normal and not suggestive of ATTR cardiac amyloidosis.This study does not exclude AL Amyloidosis.  If the clinical suspicion of amyloidosis is intermediate to high consider serum free light chain and serum and urine immunofixation.      Amyloid work-up (negative)  - pt w/ multiple risk factors including: h/o carpal tunnel surgery, AVR, afib, CKD  - 10/25: light chains 11.2/5.48/2.01 ratio, serum immunofixation- no paraprotein, urine immunofixation with not enough protein to perform.   - 10/26 PYP scan: normal and not suggestive of ATTR cardiac amyloidosis          HTN  BP Readings from Last 1 Encounters:   11/24/23 104/62   > continue PTA Toprol XL 100 mg daily     Hx of bicuspid valve  Aortic Stenosis  Aortic regurgitation  Aortic Regurgitation s/p SAVR (2009) with repeat TAVR (2022):   - h/o bicuspid aortic valve  - s/p AVR with 27 mm ThermaFix pericardial aortic valve in 2009 by Dr. [name]  - s/p TAVR 08/08/21 by Dr. Helen Hashimoto  - Echocardiogram 01/29/22 showed normal AV function with no stenosis, no regurgitation, mean gradient 10 mmHg     Permanent atrial fibrillation  Frequent PVCs  - currently on metoprolol XL 100mg  po daily for  rate control  - anticoagulation: eliquis 5mg  po BID. CHADS-Vasc score is 4 (CHF, HTN, age (2))  > continue PTA medications     Nonobstructive CAD  - LHC completed 07/29/2021 as part of pre-TAVR workup: 30% stenosis of mid LAD  > continue asa, statin      AKI on CKD stage III  Baseline creatinine ~2  Follows with Dr. Fredricka Bonine outpatient from nephrology  Recent Labs     11/22/23  0422 11/23/23  0428 11/24/23  0554   CR 2.51* 2.76* 2.97*   BUN 66* 82* 94*   NA 137 136* 138       HLD  Lab Results   Component Value Date    CHOL 121 11/24/2023    TRIG 273 (H) 11/24/2023    HDL 26 (L) 11/24/2023    LDL 72 11/24/2023    VLDL 96.0 11/24/2023    NONHDLCHOL 95 11/24/2023   3/4 increase Atorvastatin  up to 20 mg daily and add Zetia 10 mg daily   > pt may benefit from       DM II  Lab Results   Component Value Date    HGBA1C 6.7 (H) 05/07/2023 SGLT2 inhibitor as above      Anemia  Lab Results   Component Value Date    IRON 61 11/23/2023    HGB 13.8 11/24/2023    PSAT 16 (L) 11/23/2023    FERRITIN 540 (H) 11/23/2023    TIBC 378 11/23/2023   Re-check iron studies    Hx COPD  Baseline 2 LNC      Hypokalemia  40 mEq bid pta  Total of 100 MEq Kdur on metolazone days  Lab Results   Component Value Date/Time    K 4.2 11/24/2023 05:54 AM    K 4.7 11/23/2023 04:28 AM    K 3.8 11/22/2023 04:22 AM       _____________________________________________________________________________    Subjective:    He currently complains of fatigue, lying flat sleeping soundly. He awakens appropriately, denies having any pain. He feels like he still has some abdominal distention and lower extremity edema however symptoms have improved significantly since he came to hospital.     He currently denies shortness of breath, dyspnea on exertion, paroxysmal nocturnal dyspnea, orthopnea, lightheadedness, positional lightheadedness, near syncope, palpitations, chest pain, abdominal fullness, ringing in the ears, and muscle cramping.      Objective:    Allergies:   Allergies   Allergen Reactions    Hydrochlorothiazide NAUSEA AND VOMITING and SEE COMMENTS     Allergy recorded in SMS: HYDROCHLOROTHIA~Reactions: NAUSEA, causes gout    Niacin SEE COMMENTS and EDEMA     Eye swelling, it bothered my eyes      Pneumococcal Vaccine UNKNOWN                           Vital Signs:  Last Filed                Vital Signs: 24 Hour Range   BP: 85/59 (03/04 0445)  Temp: 36.3 ?C (97.4 ?F) (03/04 0445)  Pulse: 98 (03/04 0445)  Respirations: 18 PER MINUTE (03/04 0445)  SpO2: 95 % (03/04 0445)  O2 Device: Nasal cannula (03/04 0445)  O2 Liter Flow: 2 Lpm (03/04 0445)  BP: (85-133)/(54-79)   Temp:  [36.3 ?C (97.3 ?F)-36.9 ?C (98.5 ?F)]   Pulse:  [93-98]   Respirations:  [18 PER MINUTE]   SpO2:  [  92 %-96 %]   O2 Device: Nasal cannula  O2 Liter Flow: 2 Lpm    Intensity Pain Scale (Self Report): 10 (11/24/23 0445) Wt Readings from Last 10 Encounters:   11/24/23 135.1 kg (297 lb 13.5 oz)   08/17/23 (!) (P) 136.7 kg (301 lb 4.8 oz)   06/23/23 136.1 kg (300 lb)   04/08/23 (!) 137.3 kg (302 lb 12.8 oz)   03/27/23 133.6 kg (294 lb 9.6 oz)   03/20/23 (!) 139.5 kg (307 lb 9.6 oz)   11/17/22 134.4 kg (296 lb 3.2 oz)   09/25/22 132.5 kg (292 lb)   09/19/22 130.2 kg (287 lb)   09/19/22 130.2 kg (287 lb)       Physical Exam:    General Appearance: no distress, obese  Neck Veins: Unable to assess due to body habitus  Auscultation: breathing comfortably on 2L O2 per n/c, lungs clear to all lung fields   Cardiac Auscultation: Regular rhythm, S1, S2, no S3 or S4, no murmur  Lower Extremity Edema: generalized lower extremity edema  Abdominal Exam: soft, mildly distended, non-tender, bowel sounds present   Gait & Station: did not assess  Orientation: clear historian, good insight    Laboratory Review:   CBC w/Diff    Lab Results   Component Value Date/Time    WBC 7.80 11/24/2023 05:54 AM    RBC 4.29 (L) 11/24/2023 05:54 AM    HGB 13.8 11/24/2023 05:54 AM    HCT 40.7 11/24/2023 05:54 AM    MCV 95.0 11/24/2023 05:54 AM    MCH 32.1 11/24/2023 05:54 AM    MCHC 33.8 11/24/2023 05:54 AM    RDW 17.4 (H) 11/24/2023 05:54 AM    PLTCT 160 11/24/2023 05:54 AM    MPV 8.9 11/24/2023 05:54 AM    Lab Results   Component Value Date/Time    NEUT 68.5 11/24/2023 05:54 AM    ANC 5.30 11/24/2023 05:54 AM    LYMA 16.8 (L) 11/24/2023 05:54 AM    ALC 1.30 11/24/2023 05:54 AM    MONA 11.6 11/24/2023 05:54 AM    AMC 0.90 (H) 11/24/2023 05:54 AM    EOSA 2.2 11/24/2023 05:54 AM    AEC 0.20 11/24/2023 05:54 AM    BASA 0.9 11/24/2023 05:54 AM    ABC 0.10 11/24/2023 05:54 AM         Chemistry    Lab Results   Component Value Date/Time    NA 138 11/24/2023 05:54 AM    K 4.2 11/24/2023 05:54 AM    CL 87 (L) 11/24/2023 05:54 AM    CO2 41 (H) 11/24/2023 05:54 AM    GAP 10 11/24/2023 05:54 AM    BUN 94 (H) 11/24/2023 05:54 AM    CR 2.97 (H) 11/24/2023 05:54 AM    GLU 122 (H) 11/24/2023 05:54 AM    MG 2.1 11/24/2023 05:54 AM    BNP 113 (H) 06/10/2023 12:00 AM    Lab Results   Component Value Date/Time    CA 9.2 11/24/2023 05:54 AM    PO4 4.7 (H) 11/24/2023 05:54 AM    ALBUMIN 3.6 11/24/2023 05:54 AM    TOTPROT 7.1 11/24/2023 05:54 AM    ALKPHOS 110 11/24/2023 05:54 AM    AST 30 11/24/2023 05:54 AM    ALT 17 11/24/2023 05:54 AM    TOTBILI 0.5 11/24/2023 05:54 AM    GFR 21 (L) 11/24/2023 05:54 AM    GFRAA >60 02/24/2014 11:39 AM  Renal Function    Lab Results   Component Value Date/Time    NA 138 11/24/2023 05:54 AM    K 4.2 11/24/2023 05:54 AM    CL 87 (L) 11/24/2023 05:54 AM    CO2 41 (H) 11/24/2023 05:54 AM    GAP 10 11/24/2023 05:54 AM    BUN 94 (H) 11/24/2023 05:54 AM    BUN 82 (H) 11/23/2023 04:28 AM    BUN 66 (H) 11/22/2023 04:22 AM    Lab Results   Component Value Date/Time    CR 2.97 (H) 11/24/2023 05:54 AM    CR 2.76 (H) 11/23/2023 04:28 AM    CR 2.51 (H) 11/22/2023 04:22 AM    GLU 122 (H) 11/24/2023 05:54 AM    CA 9.2 11/24/2023 05:54 AM    PO4 4.7 (H) 11/24/2023 05:54 AM    ALBUMIN 3.6 11/24/2023 05:54 AM        Lipid Profile INR   Lab Results   Component Value Date    CHOL 121 11/24/2023    TRIG 273 (H) 11/24/2023    HDL 26 (L) 11/24/2023    LDL 72 11/24/2023    VLDL 16.1 11/24/2023    NONHDLCHOL 95 11/24/2023    CHOLHDLC 6 (H) 10/06/2023         Lab Results   Component Value Date    INR 1.1 08/08/2021          Chest X-Ray 11/20/2023:   Increased mild diffuse interstitial opacities, likely representing   pulmonary edema.      Tele:  AFib, HR 90s    11/20/23 EKG:  AFib with RVR, HR 106 bpm, QTc 510    Echocardiogram Details:   Echo Results  (Last 3 results in the past 3 years)      Echo EF LVIDD LVIDS LA Size IVS LVPW Rest PAP    (09/19/22)   40    (11/21/23)   5.3    (11/21/23)   4.0    (11/21/23)   5.7    (11/21/23)   1.5    (11/21/23)   1.4    (09/19/22)   15       (01/29/22)   35    (09/19/22)   5.5    (09/19/22)   4.2    (09/19/22)   6.0    (09/19/22)   1.4 (09/19/22)   1.4    (01/29/22)   31       (09/20/21)   40    (01/29/22)   5.0    (01/29/22)   3.7    (09/20/21)   5.25    (01/29/22)   1.1    (01/29/22)   1.1    (09/20/21)   42

## 2023-11-24 NOTE — Procedures
 Vascular Access Team consulted to obtain lab specimen.    Ultrasound Used: No    How Many Attempts: 0    Location of Unsuccessful Attempts: na    At 0550 approximately 5 ml of blood obtained from existing RAC PIV. Patient tolerated the procedure well. Specimen labeled and sent to lab by VAT.

## 2023-11-24 NOTE — Consults
 CLINICAL NUTRITION                                                        Clinical Nutrition Education Consult     Name: Jacob Dickerson   MRN: [id number]     DOB: [birth year]      Age: 78 y.o.  Admission Date: 11/20/2023     LOS: 4 days     Date of Service: 11/24/2023        Recommendation:  Continue cardiac diet   Adhere to 2000 ml fluid restrictions  Encourage consistent p.o intakes     Comments:  This is a 78 years old CAD, atrial fibrillation on Eliquis, hypertension, status post SAVR 2009 and TAVR 2022, chronic combined CHF, stage IV COPD, BPH, who presented to [facility] ER with leg swelling/fluid overload along with worsening shortness of breath.    Dietitian consulted for diet education on low sodium/fluid restrictions. Patient reports he tries not to overload himself with his meals. He has been eating some toast, eggs, and coffee for breakfast and has light lunch and dinner. Patient doesn't drink lots of fluids and avoids beverages high in sodium. He limits the amount of starches and sodium he has daily. He likes to eat fruits and vegetables and sometimes eats potato chips. Provided heart healthy and fluid restriction handouts. Discussed aiming for <2,000 mg of sodium a day and limiting adding table salt to meals. Patient did not have any other questions at this time. Patient not at acute nutritional risk at this time. Will continue to follow.     Nutrition Assessment of Patient:  Desired Weight: 67.8 kg  BMI (Calculated): 48.26    Pertinent Allergies/Intolerances: none noted      Oral Diet Order: Cardiac                 Weight Used for Calculation: 67.8 kg  Estimated Calorie Needs: 1695-2034 kcal (25-30 kcal/kg DBW)  Estimated Protein Needs: 68-81 g (1-1.2 g/kg DBW)    Malnutrition Assessment:   Does not meet criteria    Physical Assessment:  RLE Edema: Pitting 1+  LLE Edema: Pitting 1+    Intervention / Plan:  monitor p.o intakes for tolerance/adequacy  monitor wt trends, meds, labs, GI function  discussed low sodium/fluid restriction nutrition interventions    Ellan Lambert MS, RD, LD  Clinical Dietitian   Available on Voalte

## 2023-11-24 NOTE — Progress Notes
 [facility] END OF SHIFT/ JHFRAT NOTE    Admission Date: 11/20/2023    Acute events, interventions, provider communication: N/A    Patient Interventions and Education  Fall Risk/JHFRAT Interventions and Education: (Charting when applicable)  Elimination Interventions : Place male/male urinal within reach  and Bed pan available in room  Medications : Educate patient on medication side effects  Patient Care Equipment: Needs assistance with patient care equipment when ambulating and Ensure environment is free of clutter and walkways are clear from tripping hazards  Mobility: Assist x1, Gait belt in use when ambulating, and Utilize walker, cane, or additional walking aid for ambulation  Cognition: N/A  Risk for Moderate/Major Injury: Age: >65 yrs and Risk for fracture    2. Restraints:  No     Restraint Goal: Patient will be free from injury while physically restrained.  See Docflowsheet for restraint documentation, interventions, education, etc.    Intake and Output:       Intake/Output Summary (Last 24 hours) at 11/24/2023 0554  Last data filed at 11/24/2023 0447  Gross per 24 hour   Intake 250 ml   Output 975 ml   Net -725 ml              Last Bowel Movement Date: 11/21/23

## 2023-11-25 ENCOUNTER — Inpatient Hospital Stay: Admit: 2023-11-25 | Discharge: 2023-11-25 | Payer: MEDICARE

## 2023-11-25 MED ORDER — IPRATROPIUM-ALBUTEROL 0.5 MG-3 MG(2.5 MG BASE)/3 ML IN NEBU
3 mL | RESPIRATORY_TRACT | 0 refills | Status: DC | PRN
Start: 2023-11-25 — End: 2023-11-27

## 2023-11-25 NOTE — Progress Notes
 Heart Failure Nursing Progress Note    Admission Date: 11/20/2023  LOS: 5 days    Admission Weight: (!) 138.8 kg (305 lb 14.4 oz)        Most recent weights (inpatient):   Vitals:    11/24/23 0441 11/24/23 0900 11/25/23 0659   Weight: 135.1 kg (297 lb 13.5 oz) 135.3 kg (298 lb 3.2 oz) 133.7 kg (294 lb 12.8 oz)     Weight change from previous day:1.6 kg    Fluid restriction ordered: 2 L    Intake/Output Summary: (Last 24 hours)1.6 kg    Intake/Output Summary (Last 24 hours) at 11/25/2023 1934  Last data filed at 11/25/2023 1800  Gross per 24 hour   Intake 896 ml   Output 1585 ml   Net -689 ml       Is patient incontinent No    Heart Failure Education provided: Yes     - Heart Failure Zone Sheet and daily weight discussed  - Medications (including diuretics) reviewed during administration  - Fluid restriction discussed  - Low sodium diet discussed    Anticipated discharge date: 3/7  Discharge goals: Restart PO Diurectic, Diuresis, Monitor Creatinine      Daily Assessment of Patient Stated Goals:    Short Term Goal Identified by patient (Short Term=during hospitalization):  na

## 2023-11-25 NOTE — Progress Notes
 Heart Failure Progress Note    NAME:Jacob Dickerson             MRN: [id number]                 DOB:[birth year]          AGE: 78 y.o.  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 male was admitted on 11/20/2023 for acute on chronic heart failure exacerbation.    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     He was seen by Dr. Glean Hess and cardiology fellow Dr. Allena Katz over the weekend.  They recommended resuming his torsemide 40 mg twice daily and increasing metolazone to 5 mg 3 times a week was previously taking once weekly on Fridays.  Primary team is continue to hold diuretics due to worsening creatinine today 2.76 with baseline around 2.2.  Heart failure consult team was reengaged for further instruction regarding diuretics. Jacob Dickerson follows with Dr. Sandria Manly from cardiology team. [place]     PTA Medications: Atorvastatin 10 mg daily, Eliquis 5 mg twice daily, spironolactone 25 mg daily, Jardiance 10 mg daily, torsemide 40 mg BID, Metolazone 5 mg once/weekly, potassium chloride 40 mEq BID with an extra 20 mEq on metolazone days.    Recommendations 11/25/23:  Diuretics: None today; tomorrow start Torsemide 60 mg BID for maintenance diuretic along with Kdur 40 mEq po BID. Would also increase Metolazone 5 mg up to 2x/weekly (Mondays + Fridays) along with extra Kdur 20 mEq on Metolazone days  HFimpEF GDMT: Continue PTA Toprol XL 100 mg daily + Jardiance 10 mg daily; Spironolactone currently on hold with AKI-->would try to resume prior to discharge if renal function continues to improve  AFib: continue PTA Eliquis 5 mg BID   HLD: Atorvastatin 20 mg daily (dose increased) and started  Zetia 10 mg daily (Trigs 273, HDL 26, LDL 72) to optimize lipids   Will sign off; please call with questions.   Pt has appointment scheduled on 12/02/23 @ 0920 with his PCP Dr. Verneda Skill; will need to have routine labs checked at this visit to follow renal function. Please fax discharge summary and pertinent notes to Dr. Verlee Monte office at [phone]    Ongoing:  BMP once a day. Magnesium level daily.  Keep Potassium greater than 4.0 and Magnesium greater than 2.0.  2000mg  sodium dietary restriction.  Fluid Restriction:2L  Strict I/O. Goal output: net even  Daily standing scale weight. Goal Dry Weight: ~290 lbs    Please check NT-pro BNP on day of discharge for comparison to admission result in order to establish baseline.  Pt seen and examined; discussed with Dr. Wallie Char and primary team       Judge Duque D [place] Throckmorton, APRN-NP  Heart Failure Consult Service   Available on Voalte      Assessment:   Acute on Chronic Systolic HFimpEF EF 45% on 11/21/23    -Echo on 09/19/22 showed an EF of 40% (01/2022 EF 35%, 07/2021 50%).   Major Complications or Comorbidities [facility]): acute/ acute on chronic systolic and/or diastolic heart failure  NYHA functional class III (marked limitation of physical activity - comfortable at rest, but less than ordinary activity causes symptoms of HF e.g., getting dressed or standing from a sitting position),   ACC Stage C (structural heart disease with prior or current symptoms of HF).   He presents with signs of hypervolemia with left  ventricular failure without signs of low flow state.    Lab Results   Component Value Date    HSTROP0HR 41 (H) 02/21/2022    HSTROP2HR 37 (H) 02/21/2022    HSTROP4HR 36 (H) 02/21/2022    HIGHSTROPI 42 (H) 11/22/2023       BNP Labs:   Lab Results   Component Value Date    NTPROBNP 2,169 (H) 11/23/2023    NTPROBNP 2,339 (H) 11/20/2023    NTPROBNP 2,149.0 (H) 04/08/2023        Admission Weight: (!) 138.8 kg (305 lb 14.4 oz)        Most recent weights (inpatient):   Vitals:    11/24/23 0441 11/24/23 0900 11/25/23 0659   Weight: 135.1 kg (297 lb 13.5 oz) 135.3 kg (298 lb 3.2 oz) 133.7 kg (294 lb 12.8 oz)        Strict I/Os: **inaccurate   Total Stay Net: -4.5 L  Last 24 hrs Net: -836 mL  UOP in last 24 hrs: 2180 mL     Diuretic Therapy    Prior to admission dose Torsemide 40 mg po bid + metolazone 5 mg weekly    Given on admission Bumex 4 mg IV    Daily Dosing 3/1: Bumex 4 mg IV bid  3/2-3/3 AM: diuretic holiday.  3/3: Bumex 4 mg po x1  3/4: Bumex 4 mg po x1, then IVF 250 mL x1   3/5: none   Rec 3/1 to cont torsemide 40 mg bid and increase metolazone to 5 mg MWF (vs weekly Friday) previously     Guideline Directed Medical Therapy PTA Inpatient Changes   ACEI/ARB/ARNI CKD    BB Toprol-XL 100 mg daily cont   SGLT-2 Inhibitor Jardiance 10 mg daily  cont   Mineralocorticoid Receptor Antagonist Spironolactone 25 mg po daily Held with creat rising; consider resumption after renal function improves   Hydralazine/Nitrate No EF>40%    Ivabradine No; EF>35% and Not in sinus rhythm    Heart Rhythm Management Therapy No (EF>35%)    Anticoagulation for Afib/flutter Permanent A-fib on Eliquis    Cardiac Rehab Evaluation for LVEF < or equal to 40%  *Referral good for up to a year No; EF>40%    7-Day post hospital follow up scheduled within 24-48 hours of discharge No Discharge not expected within the next 24-48 hours        Most Recent Cardiac Testing/Procedures     ECHO 11/21/23:    Ventricle not well seen. The left ventricular size is normal. Moderate concentric hypertrophy. The left ventricular systolic function is mildly reduced. Beat-to-beat variation with A-fib. The ejection fraction by Simpson's biplane method is 48%. Abnormal septal motion. Left  ventricular diastolic dysfunction. Unable to assess left atrial pressure.    Right Ventricle: The right ventricle is mildly dilated. The right ventricular systolic function is normal.    Left Atrium: Severely dilated. Right Atrium: Severely dilated.    There is a 26 mm Edwards SAPIEN bioprosthetic valve present.  The valve is not well-visualized.  Appears well-seated mean gradient of 7 mmHg unchanged compared to prior study dated 12//2023 no regurgitation.    Technically difficult study with poor visualization, interpretation accuracy could be affected.       Transthoracic echocardiogram 09/19/2022    Technically difficult study.    The left ventricular size is normal. Wall thickness is increased. Mild concentric hypertrophy. The left ventricular systolic function is moderately reduced. The visually estimated ejection fraction is 40%. Abnormal septal motion consistent with left bundle branch block.    The right ventricular size is normal. The right ventricular systolic function is normal.    Left Atrium: Mildly dilated. Right Atrium: Moderately dilated.    Aortic Valve: There is a 26 mm Edwards SAPIEN bioprosthetic valve present. The prosthetic valve is normal. No stenosis.  MG = 7 mmHg, DVI = 0.5.  No regurgitation.    The ascending aorta is moderately dilated.    No pericardial effusion.    Compared with study dated 01/29/2022, no significant change is noted    01/29/22 Echo:  The left ventricle is normal in size, moderately reduced function, estimate ejection fraction 35%.  Global hypokinesis pattern, very difficult endocardial definition despite use of echo contrast, regional wall motion assessment difficult  Unable to assess diastolic function given abnormal underlying rhythm, atrial fibrillation.  The right ventricle is mildly dilated, function is probably normal.  Severe biatrial enlargement  There is a 26 mm Edwards SAPIEN bioprosthesis in aortic position, normal function, no stenosis, no regurgitation, mean gradient 10 mmHg  The estimated PA systolic pressure is 31 mmHg  Moderately dilated ascending aorta     Comparison is made to prior study of 09/20/21.  EF by Simpson's biplane on previous study 38%, LV function appears normal similar.     02/28/22 RHC:  RA pressure 4 mmHg with a V-wave of 7 mmHg.  RV pressure 23/2 mmHg with an RVEDP of 4 mmHg.  PA pressure 23/13 mmHg with a mean PA pressure of 16 mmHg.  Pulmonary capillary wedge pressure of 5 mmHg with a V-wave of 6 mmHg.  Transpulmonary gradient of 11 mmHg.  PA sat 65%, RA sat 65%, pulse ox 94%.  Blood pressure 98/66 mmHg and heart rate 77 beats per minute.  Body surface area 2.27 and hemoglobin 15.9.  Cardiac output by Fick 4.52 L/minute and by thermo 3.4 L/minute.  Cardiac index by Fick 2.0 L/minute/m2 and by thermo 1.5 L/minutes/m2.  PVR by Fick was 2.4.  PVR by thermo was 3.2.     07/29/21 Right and Left heart cath:     HEMODYNAMICS OF RIGHT HEART CATHETERIZATION:    Body surface area 2.20 m2.  Hemoglobin percent is 12 grams/dL.  Blood pressure 123/58 mmHg with a mean of 83 mmHg.  Heart rate 75 beats per minute.     SATURATIONS:    Aorta saturation 99%.  Right atrial saturation 66%.  Pulmonary artery saturation 68%.     PRESSURES:    Right atrial pressure 8 mmHg.  Right ventricular pressure 26/8 mmHg.  Pulmonary artery pressure 31/13 mmHg with a mean of 19 mmHg.  Pulmonary capillary wedge pressure of 13 mmHg.  Transpulmonary gradient of  6 mmHg.  Diastolic pulmonary gradient of 0 mmHg.  Cardiac output by Fick method was 5.47 L/minute and by thermodilution technique was 4.75 L/minute.  Cardiac index by Fick method was 2.47 L/minute per sq m and by thermodilution technique was 2.16 L/minute per sq m.  Pulmonary vascular resistance was 1.09 Woods unit.  Systemic vascular resistance was 1097 dynes.second.centimeter 5.     HEMODYNAMICS OF LEFT HEART CATHETERIZATION:  The left ventricular systolic pressure was 106 mmHg.  The aortic pressure was 75/33 mmHg.  The gradient across the aortic valve was 31 mmHg, which was suggestive of moderate aortic stenosis.  The left ventricular end-diastolic pressure was 12 mmHg.     SELECTIVE CORONARY ANGIOGRAPHY:    Left main coronary artery:  The left main coronary artery arises normally from the left coronary sinus.  The left main artery bifurcates into the left anterior descending artery and left circumflex artery.  The left main coronary artery is free of angiographically significant disease.  Left anterior descending artery:  The left anterior descending artery is a large caliber vessel.  The mid LAD has a focal area of 30% stenosis after the 1st diagonal branch.  This is a type 3 LAD.  It gives rise to a medium caliber diagonal branch which gives rise to a superior inferior branch which is free of angiographically significant disease.  Left circumflex artery:  The left circumflex artery is a large caliber vessel which gives rise to a high 1st obtuse marginal branch followed by two additional obtuse marginal branches.  The left circumflex artery as well as the branches are free of angiographically significant disease.  Right coronary artery:  The right coronary artery is a large caliber dominant vessel which arises normally from the right coronary sinus.  The right coronary artery distally bifurcates into the right posterior descending artery and right posterolateral branch.  The right coronary as well as its branches are free of angiographically significant disease.     FINAL IMPRESSION:  Normal right and left-sided filling pressures.  Normal cardiac output and cardiac index by Fick method.  The gradient across the aortic valve was 31 mmHg, which is suggestive of moderate aortic stenosis.  Mild nonobstructive coronary artery disease as described above.    The left ventricular end-diastolic pressure was 12 mmHg.     07/17/21 PYP scan:  This study is normal and not suggestive of ATTR cardiac amyloidosis.This study does not exclude AL Amyloidosis.  If the clinical suspicion of amyloidosis is intermediate to high consider serum free light chain and serum and urine immunofixation.      Amyloid work-up (negative)  - pt w/ multiple risk factors including: h/o carpal tunnel surgery, AVR, afib, CKD  - 10/25: light chains 11.2/5.48/2.01 ratio, serum immunofixation- no paraprotein, urine immunofixation with not enough protein to perform.   - 10/26 PYP scan: normal and not suggestive of ATTR cardiac amyloidosis          HTN  BP Readings from Last 1 Encounters:   11/25/23 95/47   > continue PTA Toprol XL 100 mg daily     Hx of bicuspid valve  Aortic Stenosis  Aortic regurgitation  Aortic Regurgitation s/p SAVR (2009) with repeat TAVR (2022):   - h/o bicuspid aortic valve  - s/p AVR with 27 mm ThermaFix pericardial aortic valve in 2009 by Dr. [name]  - s/p TAVR 08/08/21 by Dr. Helen Hashimoto  - Echocardiogram 01/29/22 showed normal AV function with no stenosis, no regurgitation, mean gradient 10 mmHg  Permanent atrial fibrillation  Frequent PVCs  - currently on metoprolol XL 100mg  po daily for rate control  - anticoagulation: eliquis 5mg  po BID. CHADS-Vasc score is 4 (CHF, HTN, age (2))  > continue PTA medications     Nonobstructive CAD  - LHC completed 07/29/2021 as part of pre-TAVR workup: 30% stenosis of mid LAD  > continue asa, statin      AKI on CKD stage III  Baseline creatinine ~2  Follows with Dr. Fredricka Bonine outpatient from nephrology  Recent Labs     11/23/23  0428 11/24/23  0554 11/25/23  0506   CR 2.76* 2.97* 2.60*   BUN 82* 94* 91*   NA 136* 138 137       HLD  Lab Results   Component Value Date    CHOL 121 11/24/2023    TRIG 273 (H) 11/24/2023    HDL 26 (L) 11/24/2023    LDL 72 11/24/2023    VLDL 29.5 11/24/2023    NONHDLCHOL 95 11/24/2023   3/4 increase Atorvastatin  up to 20 mg daily and add Zetia 10 mg daily   > pt may benefit from       DM II  Lab Results   Component Value Date    HGBA1C 6.7 (H) 05/07/2023   SGLT2 inhibitor as above      Anemia  Lab Results   Component Value Date    IRON 61 11/23/2023    HGB 13.7 11/25/2023    PSAT 16 (L) 11/23/2023    FERRITIN 540 (H) 11/23/2023    TIBC 378 11/23/2023   Re-check iron studies    Hx COPD  Baseline 2 LNC      Hypokalemia  40 mEq bid pta  Total of 100 MEq Kdur on metolazone days  Lab Results   Component Value Date/Time    K 3.8 11/25/2023 05:06 AM    K 4.2 11/24/2023 05:54 AM    K 4.7 11/23/2023 04:28 AM       _____________________________________________________________________________    Subjective:    He currently complains of  pain to feet and back , he is lying flat. He continues to have ongoing chronic back pain; wondering if there is something which will help this.      He currently denies fatigue, shortness of breath, dyspnea on exertion, paroxysmal nocturnal dyspnea, orthopnea, lightheadedness, positional lightheadedness, near syncope, palpitations, chest pain, abdominal fullness, ringing in the ears, muscle cramping, and peripheral edema.      Objective:    Allergies:   Allergies   Allergen Reactions    Hydrochlorothiazide NAUSEA AND VOMITING and SEE COMMENTS     Allergy recorded in SMS: HYDROCHLOROTHIA~Reactions: NAUSEA, causes gout    Niacin SEE COMMENTS and EDEMA     Eye swelling, it bothered my eyes      Pneumococcal Vaccine UNKNOWN                           Vital Signs:  Last Filed                Vital Signs: 24 Hour Range   BP: 95/47 (03/05 0529)  Temp: 36.3 ?C (97.3 ?F) (03/05 2841)  Pulse: 87 (03/05 0529)  Respirations: 16 PER MINUTE (03/05 0529)  SpO2: 93 % (03/05 0529)  O2 Device: Nasal cannula (03/05 0529)  O2 Liter Flow: 2 Lpm (03/05 0529)  BP: (94-114)/(47-66)   Temp:  [36.3 ?C (97.3 ?F)-36.9 ?C (98.4 ?F)]  Pulse:  [82-100]   Respirations:  [16 PER MINUTE-20 PER MINUTE]   SpO2:  [92 %-94 %]   O2 Device: Nasal cannula  O2 Liter Flow: 2 Lpm    Intensity Pain Scale (Self Report): 10 (11/25/23 0236)      Wt Readings from Last 10 Encounters:   11/25/23 133.7 kg (294 lb 12.8 oz)   08/17/23 (!) (P) 136.7 kg (301 lb 4.8 oz)   06/23/23 136.1 kg (300 lb)   04/08/23 (!) 137.3 kg (302 lb 12.8 oz)   03/27/23 133.6 kg (294 lb 9.6 oz)   03/20/23 (!) 139.5 kg (307 lb 9.6 oz)   11/17/22 134.4 kg (296 lb 3.2 oz)   09/25/22 132.5 kg (292 lb)   09/19/22 130.2 kg (287 lb)   09/19/22 130.2 kg (287 lb)       Physical Exam:    General Appearance: no distress, obese  Neck Veins: Unable to assess due to body habitus  Auscultation: breathing comfortably on 2L O2 per n/c, lungs clear to all lung fields   Cardiac Auscultation: Regular rhythm, S1, S2, no S3 or S4, no murmur  Lower Extremity Edema: no lower extremity edema  Abdominal Exam: soft, non-distended, non-tender, bowel sounds present   Gait & Station: did not assess  Orientation: clear historian, good insight    Laboratory Review:   CBC w/Diff    Lab Results   Component Value Date/Time    WBC 7.90 11/25/2023 05:06 AM    RBC 4.28 (L) 11/25/2023 05:06 AM    HGB 13.7 11/25/2023 05:06 AM    HCT 41.1 11/25/2023 05:06 AM    MCV 95.9 11/25/2023 05:06 AM    MCH 32.1 11/25/2023 05:06 AM    MCHC 33.5 11/25/2023 05:06 AM    RDW 17.4 (H) 11/25/2023 05:06 AM    PLTCT 161 11/25/2023 05:06 AM    MPV 8.7 11/25/2023 05:06 AM    Lab Results   Component Value Date/Time    NEUT 67.6 11/25/2023 05:06 AM    ANC 5.40 11/25/2023 05:06 AM    LYMA 15.8 (L) 11/25/2023 05:06 AM    ALC 1.30 11/25/2023 05:06 AM    MONA 13.7 (H) 11/25/2023 05:06 AM    AMC 1.10 (H) 11/25/2023 05:06 AM    EOSA 1.9 11/25/2023 05:06 AM    AEC 0.10 11/25/2023 05:06 AM    BASA 1.0 11/25/2023 05:06 AM    ABC 0.10 11/25/2023 05:06 AM         Chemistry    Lab Results   Component Value Date/Time    NA 137 11/25/2023 05:06 AM    K 3.8 11/25/2023 05:06 AM    CL 87 (L) 11/25/2023 05:06 AM    CO2 39 (H) 11/25/2023 05:06 AM    GAP 11 11/25/2023 05:06 AM    BUN 91 (H) 11/25/2023 05:06 AM    CR 2.60 (H) 11/25/2023 05:06 AM    GLU 132 (H) 11/25/2023 05:06 AM    MG 2.3 11/25/2023 05:06 AM    BNP 113 (H) 06/10/2023 12:00 AM    Lab Results   Component Value Date/Time    CA 8.7 11/25/2023 05:06 AM    PO4 5.0 (H) 11/25/2023 05:06 AM    ALBUMIN 3.6 11/25/2023 05:06 AM    TOTPROT 6.9 11/25/2023 05:06 AM    ALKPHOS 112 (H) 11/25/2023 05:06 AM    AST 29 11/25/2023 05:06 AM    ALT 17 11/25/2023 05:06 AM    TOTBILI 0.4 11/25/2023 05:06 AM  GFR 25 (L) 11/25/2023 05:06 AM    GFRAA >60 02/24/2014 11:39 AM            Renal Function    Lab Results   Component Value Date/Time    NA 137 11/25/2023 05:06 AM    K 3.8 11/25/2023 05:06 AM    CL 87 (L) 11/25/2023 05:06 AM    CO2 39 (H) 11/25/2023 05:06 AM    GAP 11 11/25/2023 05:06 AM    BUN 91 (H) 11/25/2023 05:06 AM    BUN 94 (H) 11/24/2023 05:54 AM    BUN 82 (H) 11/23/2023 04:28 AM    Lab Results   Component Value Date/Time    CR 2.60 (H) 11/25/2023 05:06 AM    CR 2.97 (H) 11/24/2023 05:54 AM    CR 2.76 (H) 11/23/2023 04:28 AM    GLU 132 (H) 11/25/2023 05:06 AM    CA 8.7 11/25/2023 05:06 AM    PO4 5.0 (H) 11/25/2023 05:06 AM    ALBUMIN 3.6 11/25/2023 05:06 AM        Lipid Profile INR   Lab Results   Component Value Date    CHOL 121 11/24/2023    TRIG 273 (H) 11/24/2023    HDL 26 (L) 11/24/2023    LDL 72 11/24/2023    VLDL 25.9 11/24/2023    NONHDLCHOL 95 11/24/2023    CHOLHDLC 6 (H) 10/06/2023         Lab Results   Component Value Date    INR 1.1 08/08/2021          Chest X-Ray 11/20/2023:   Increased mild diffuse interstitial opacities, likely representing   pulmonary edema.      Tele:  AFib, HR 80s    11/20/23 EKG:  AFib with RVR, HR 106 bpm, QTc 510    Echocardiogram Details:   Echo Results  (Last 3 results in the past 3 years)      Echo EF LVIDD LVIDS LA Size IVS LVPW Rest PAP    (09/19/22)   40    (11/21/23)   5.3    (11/21/23)   4.0    (11/21/23)   5.7    (11/21/23)   1.5    (11/21/23)   1.4    (09/19/22)   15       (01/29/22)   35 (09/19/22)   5.5    (09/19/22)   4.2    (09/19/22)   6.0    (09/19/22)   1.4    (09/19/22)   1.4    (01/29/22)   31       (09/20/21)   40    (01/29/22)   5.0    (01/29/22)   3.7    (09/20/21)   5.25    (01/29/22)   1.1    (01/29/22)   1.1    (09/20/21)   42

## 2023-11-25 NOTE — Progress Notes
 Pharmacy High Risk Medication Review    A high risk medication review has been performed for Christen Bame by a pharmacy team member.      Medication class(es) to review on inpatient med list:  Muscle relaxant and Mood stabilizer      The medications class(es) are noted to be high risk to mobility or mentation in patients >78 years of age. If your patient has been CAM positive (diagnosed with delirium) at any point during this hospitalization then it would be particularly helpful to review these medications. Each medication has its own unique risks and benefits to the patient, we ask that these medications are reviewed and considered for de-prescribing where appropriate.     Consider reviewing [email or web address] for anticholinergic burden calculation.  If you need further assistance identifying or de-prescribing, please contact pharmacy.      Current Inpatient Medications:  Scheduled Meds:allopurinoL (ZYLOPRIM) tablet 100 mg, 100 mg, Oral, BID  apixaban (ELIQUIS) tablet 5 mg, 5 mg, Oral, BID  atorvastatin (LIPITOR) tablet 20 mg, 20 mg, Oral, QDAY  divalproex (DEPAKOTE) DR tablet 750 mg, 750 mg, Oral, QHS  empagliflozin (JARDIANCE) tablet 10 mg, 10 mg, Oral, QDAY  ezetimibe (ZETIA) tablet 10 mg, 10 mg, Oral, QDAY  gabapentin (NEURONTIN) capsule 200 mg, 200 mg, Oral, BID  levothyroxine (SYNTHROID) tablet 75 mcg, 75 mcg, Oral, QDAY 30 min before breakfast  lidocaine (LIDODERM) 5 % topical patch 2 patch, 2 patch, Topical, QDAY  metoprolol succinate XL (TOPROL XL) tablet 100 mg, 100 mg, Oral, QDAY  sertraline (ZOLOFT) tablet 50 mg, 50 mg, Oral, QAM8  [Held by Provider] spironolactone (ALDACTONE) tablet 25 mg, 25 mg, Oral, QDAY  tamsulosin (FLOMAX) capsule 0.4 mg, 0.4 mg, Oral, QAM8    Continuous Infusions:  PRN and Respiratory Meds:acetaminophen Q6H PRN, alum-mag hydroxide-simeth Q4H PRN **AND** lidocaine hcl viscous Q4H PRN, artificial tears (PF) single dose PRN, eucalyptus-menthoL Q2H PRN, fluticasone propionate BID PRN, melatonin QHS PRN, methocarbamoL Q8H PRN, ondansetron Q6H PRN **OR** ondansetron (ZOFRAN) IV Q6H PRN, polyethylene glycol 3350 QDAY PRN, sennosides-docusate sodium QDAY PRN        Thank you,  Valetta Close, [facility]  11/25/2023

## 2023-11-25 NOTE — Progress Notes
 BH 45 END OF SHIFT/ JHFRAT NOTE    Admission Date: 11/20/2023    Acute events, interventions, provider communication: N/A    Patient Interventions and Education  Fall Risk/JHFRAT Interventions and Education: (Charting when applicable)  Elimination Interventions : Place male/male urinal within reach   Medications : Use of gait belt  and Educate patient on medication side effects  Patient Care Equipment: Needs assistance with patient care equipment when ambulating and Ensure environment is free of clutter and walkways are clear from tripping hazards  Mobility: Assist x1, Gait belt in use when ambulating, and Utilize walker, cane, or additional walking aid for ambulation  Cognition: N/A  Risk for Moderate/Major Injury: Age: >65 yrs and Risk for fracture    2. Restraints:  No     Restraint Goal: Patient will be free from injury while physically restrained.  See Docflowsheet for restraint documentation, interventions, education, etc.    Intake and Output:       Intake/Output Summary (Last 24 hours) at 11/25/2023 0559  Last data filed at 11/25/2023 0530  Gross per 24 hour   Intake 1344 ml   Output 1980 ml   Net -636 ml              Last Bowel Movement Date: 11/24/23

## 2023-11-25 NOTE — Progress Notes
 PHYSICAL THERAPY  PROGRESS NOTE        Name: Jacob Dickerson   MRN: [id number]     DOB: Jul 27, 1946      Age: 78 y.o.  Admission Date: 11/20/2023     LOS: 5 days     Date of Service: 11/25/2023      Mobility  Patient Turn/Position: Self;Supine  Mobility Level [facility] Highest Level of Mobility (JH-HLM): Walk 25 feet or more (to doorway/hallway)  Distance Walked (feet): 100 ft  Level of Assistance: Assist X1  Assistive Device: Walker  Activity Limited By: Fatigue;Shortness of air;Weakness    Subjective  Reason for Admission and Past Medical Hx: 78 y.o. male admitted with acute on chronic HF, fluid overload, shortness of air. PMH: s/p AVR, CKD, HLD, COPD, s/p TAVR (2022), A-fib, hypothyroid  Special Considerations: Current oxygen requirement;Hearing impaired (2L via NC)  Comments: Pt. said he does not need O2 at baseline.  Mental / Cognitive: Alert;Oriented;To person;To place;To situation;Cooperative;Follows commands  Pain: Complains of pain  Pain level: During activity;7/10  Pain Location: Back  Pain Description: Aching  Pain Interventions: Patient agrees to participate in therapy with current pain level;Patient assisted into position of comfort    Home Living Situation  Lives With: Alone;Has assistance available as needed  Comments: Independent apartment at assisted living facility.  Type of Home: Assisted living  Comments: meals provided; needed assist with bathing  Entry Stairs: No stairs  In-Home Stairs: No stairs  Bathroom Setup: Walk in shower  Patient Owned Equipment: Bathing: Shower chair;Cane: Pyramid;Walker: Rollator  Comments: Bed alarm active and call light in reach upon PT departure.    Prior Level of Function  Level Of Independence: Independent with ADL and community mobility with device  Required Assist For: Bathing;Dressing  History of Falls in Past 3 Months: No  Occupation/Education: Retired    ROM  ROM Position Assessed: Seated  R LE ROM: WFL  R LE ROM Method: Active  L LE ROM: WFL  L LE ROM Method: Active    Strength  Overall Strength: WFL  Strength Comments: No MMT done secondary to low back pain. He has sufficient anti-gravity strength to transfer with SBA.    Posture/Neurological  Head Control: Independent  Posture: Forward head;Rounded shoulders;Obesity  Overall Tone: Normal    Bed Mobility/Transfer  Bed Mobility: Supine to Sit: Standby Assist;Head of Bed Elevated;No Rail  Bed Mobility: Sit to Supine: Standby Assist;Bed Flat;No Rail  Comments: Pt. said he prefers sleeping in a recliner at home.  Transfer Type: Sit to/from Stand  Transfer: Assistance Level: To/From;Bed;Standby Assist  Transfer: Assistive Device: Nurse, adult  Transfers: Type Of Assistance: For Safety Considerations  End Of Activity Status: In Bed;Instructed Patient to Request Assist with Mobility;Instructed Patient to Use Call Light;Nursing Notified    Balance  Sitting Balance: Dynamic Sitting Balance;1 UE Support;Independent  Standing Balance: Dynamic Standing Balance;2 UE support;Standby Assist  [employer] Balance Scale: 3/5:   Independent stands without support for up to 30 seconds    Gait  Gait Distance: 100 feet  Gait: Assistance Level: Minimal Assist;Safety Considerations (CGA)  Gait: Assistive Device: Nurse, adult  Gait: Descriptors: Pace: Futures trader;No balance loss;Decreased step length  Comments: Pt requesting to be placed back on O2 after bout of ambulation d/t feeling SOA.    Activity/Exercise  Sit Edge Of Bed: 5 minutes  Sit Edge Of Bed Assist: Independent  Stand At Bedside : 2 minutes  Stand At Bedside Assist: Stand By Assist  Education  Persons Educated: Patient  Patient Barriers To Learning: Decreased Hearing  Interventions: Repetition of Instructions;Louder Voice Required;Demonstration Provided  Teaching Methods: Verbal Instruction;Demonstration  Patient Response: Verbalized Understanding;Return Demonstration  Topics: Plan/Goals of PT Interventions;Use of Assistive Device/Orthosis;Mobility Progression;Importance of Increasing Activity;Ambulate With Nursing;Equipment Recommendations;Recommend Continued Therapy;Therapy Schedule    Assessment/Progress  Impaired Mobility Due To: Decreased Activity Tolerance;Medical Status Limitation  Assessment/Progress: Should Improve w/ Continued PT  Comments: Pt with good tolerance of PT on this date.  Pt states he feels he is nearing his baseline but he is hopeful that [name] can assess his back pain and assist in some way.  Pt states he uses a 4WW at baseline.  PT will continue to follow to progress mobility.    AM-PAC 6 Clicks Basic Mobility Inpatient  Turning from your back to your side while in a flat bed without using bed rails: None  Moving from lying on your back to sitting on the side of a flat bed without using bedrails : None  Moving to and from a bed to a chair (including a wheelchair): A Little  Standing up from a chair using your arms (e.g. wheelchair, or bedside chair): A Little  To walk in hospital room: A Little  Climbing 3-5 steps with a railing: A Little  Basic Mobility Inpatient Raw Score: 20  Standardized (T-scale) Score: 43.99  Mobility Goal [facility]: JH-HLM 6 Walk >= 10 steps    Goals  Goal Formulation: With Patient  Time For Goal Achievement: 3 days  Patient Will Transfer Bed/Chair: Independently, Ongoing  Patient Will Transfer Sit to Stand: Independently, Ongoing  Patient Will Ambulate: 101-150 Feet, w/ Dan Humphreys, Independently, Ongoing    Plan  Treatment Interventions: Mobility training  Plan Frequency: 1-2 Days per Week  PT Plan for Next Visit: continue progressing independence with mobility and ambulation    PT Discharge Recommendations  Recommendation: Home/prior living situation  Patient Currently Requires Physical Assist With: Bathing;Ambulation;In and out of house  Patient Currently Requires Equipment: Owns what is needed      Therapist: Orvis Brill, PT, DPT  Date: 11/25/2023

## 2023-11-25 NOTE — Progress Notes
 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  Vital signs in the ER showed slight hypoxemia/stable creatinine elevated NT proBNP at 2339.  Chest x-ray obtained on admission showed cardiomegaly/worsening interstitial opacities compared to last chest x-ray.     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 Sandria Manly.  Most recent echo LVH well-seated aortic valve.  Prior to admission medication regimen:  Eliquis, aspirin, Lipitor 20, Jardiance, metolazone every Friday, Toprol 100 Dickerson, Aldactone 25 Dickerson and torsemide 40 mg twice Dickerson.  - The patient is presenting with bilateral lower extremity edema fluid overload/shortness of breath.  His weight now is up to 303, he says he was as low as 280 when he was dry  -Patient does not know the majority of his medications, he lives in an assisted living facility and the medications are often just brought to them and he also feels like there is not much flexibility in his overall meals  -2D echocardiogram had poor visualization of the LV, EF was estimated at 48% there is severely dilated LA and RA  Plan:  > Cardiology was consulted, had signed off on 3/1 with recommendation transition to p.o. diuresis on 3/2.  However, with Cr increased had given a diuretic holiday. Weight has increased steadily and Cardiology re-engaged on 3/3 and have placed on PO diuretic, his creatinine continued to increase so diuretic once again held on 3/4  > Cards ordered gentle IVF x 1 on 3/4 and had improvement in creatinine.  Cardiology recommending starting torsemide on 3/6+ metolazone Mondays and Fridays moving forward with potassium replacement on the days he takes metolazone  - Continue Jardiance/Toprol  > Hold ladactone due to renal function    AKI on CKD stage III  - Will monitor Cr with diuresis, baseline creatinine approximately 1.8-2, Cr increased to 2.5 on 3/2  - Continue Gabapentin for neuropathy  - Hold Aldactone due to renal function     Paroxysmal atrial fibrillation  Atypical chest pain -intermittent for several months  - Continue anticoagulation with Eliquis, monitor on telemetry  - Continue PTA metoprolol  - Last ischemic evaluation was in November 2022 when he  was noted to have nonobstructive coronary disease and medical management was recommended  - Reports atypical chest pain that is described of left-sided chest tightness as well as dyspnea this is atypical in the sense that this also is reproducible at times when he presses on his left chest  - Cardiology follow-up given atypical nature of the chest pain do not suspect ischemic etiology warranting any ischemic work up  - Lidocaine patches prn     COPD stage  Acute on chronic hypoxemic respiratory failure  - Baseline is 2 L of oxygen,, presentation did require 3 to 4 L transiently suspected likely secondary to fluid overload.  Did improve back to 2 L with diuresis  - No wheezing on exam  - Duonebs PRN     Mood disorder  - Continue Depakote and sertraline     Hypothyroidism-continue PTA Synthroid  BPH-continue PTA tamsulosin     Code: DNAR-FI, see ACP note from 3/1    Dispo: Continued patient hospitalization    Patient was discussed at multidisciplinary rounds which included nurse, pharmacist, social work, and nurse case management who provided additional information that affected the patient's care.    High medical decision making due to the following:  drug therapy requiring intensive monitoring for toxicity, IV controlled substances, and decision regarding hospitalization     Note to patient: The 21st Century Cures Act makes medical notes like these available to patients in the interest of transparency. However, be advised this is a medical document. It is intended as peer to peer communication. It is written in medical language and may contain abbreviations or verbiage that are unfamiliar. It may appear blunt or direct. Medical documents are intended to carry relevant information, facts as evident, and the clinical opinion of the practitioner.    Clearence Ped, DO  Pager [phone]    __________________________________________________________________________________  Subjective: Jacob Dickerson is a 78 y.o. male no acute events overnight, his leg still felt tight.  Denies any chest pain or dyspnea.  He continues to have chronic back pain that remains unchanged.  No nausea or emesis.    Medications:  Scheduled Meds:allopurinoL (ZYLOPRIM) tablet 100 mg, 100 mg, Oral, BID  apixaban (ELIQUIS) tablet 5 mg, 5 mg, Oral, BID  atorvastatin (LIPITOR) tablet 20 mg, 20 mg, Oral, QDAY  divalproex (DEPAKOTE) DR tablet 750 mg, 750 mg, Oral, QHS  empagliflozin (JARDIANCE) tablet 10 mg, 10 mg, Oral, QDAY  ezetimibe (ZETIA) tablet 10 mg, 10 mg, Oral, QDAY  gabapentin (NEURONTIN) capsule 200 mg, 200 mg, Oral, BID  levothyroxine (SYNTHROID) tablet 75 mcg, 75 mcg, Oral, QDAY 30 min before breakfast  lidocaine (LIDODERM) 5 % topical patch 2 patch, 2 patch, Topical, QDAY  metoprolol succinate XL (TOPROL XL) tablet 100 mg, 100 mg, Oral, QDAY  sertraline (ZOLOFT) tablet 50 mg, 50 mg, Oral, QAM8  [Held by Provider] spironolactone (ALDACTONE) tablet 25 mg, 25 mg, Oral, QDAY  tamsulosin (FLOMAX) capsule 0.4 mg, 0.4 mg, Oral, QAM8    Continuous Infusions:  PRN and Respiratory Meds:acetaminophen Q6H PRN, alum-mag hydroxide-simeth Q4H PRN **AND** lidocaine hcl viscous Q4H PRN, artificial tears (PF) single dose PRN, eucalyptus-menthoL Q2H PRN, fluticasone propionate BID PRN, melatonin QHS PRN, methocarbamoL Q8H PRN, ondansetron Q6H PRN **OR** ondansetron (ZOFRAN) IV Q6H PRN, polyethylene glycol 3350 QDAY PRN, sennosides-docusate sodium QDAY PRN      Physical Exam:  Vital Signs: Last Filed In 24 Hours Vital Signs: 24 Hour Range   BP: 105/59 (03/05 1158)  Temp: 36.6 ?C (97.8 ?F) (03/05  1158)  Pulse: 85 (03/05 1158)  Respirations: 16 PER MINUTE (03/05 1158)  SpO2: 97 % (03/05 1158)  O2 Device: Nasal cannula (03/05 1158)  O2 Liter Flow: 2 Lpm (03/05 1158) BP: (85-114)/(47-64)   Temp:  [36.3 ?C (97.3 ?F)-36.9 ?C (98.4 ?F)]   Pulse:  [85-100]   Respirations:  [16 PER MINUTE-20 PER MINUTE]   SpO2:  [92 %-97 %]   O2 Device: Nasal cannula  O2 Liter Flow: 2 Lpm   Intensity Pain Scale (Self Report): 9 (11/25/23 0981)      General:  Alert, cooperative, no distress, appears stated age  Neck: Unable to appreciate JVD due to body habitus  Lungs:  Clear to auscultation bilaterally  Chest wall: Chest pain elicited with palpation in the left chest wall  Heart:  RRR, S1, S2, no other murmurs  Abdomen:  (+)BS, soft, nt/nd  Extremities: 1+ lower extremity edema    Lab/Radiology/Other Diagnostic Tests:  24-hour labs:    Results for orders placed or performed during the hospital encounter of 11/20/23 (from the past 24 hours)   CBC AND DIFF    Collection Time: 11/25/23  5:06 AM   Result Value Ref Range    White Blood Cells 7.90 4.50 - 11.00 10*3/uL    Red Blood Cells 4.28 (L) 4.40 - 5.50 10*6/uL Hemoglobin 13.7 13.5 - 16.5 g/dL    Hematocrit 19.1 47.8 - 50.0 %    MCV 95.9 80.0 - 100.0 fL    MCH 32.1 26.0 - 34.0 pg    MCHC 33.5 32.0 - 36.0 g/dL    RDW 29.5 (H) 62.1 - 15.0 %    Platelet Count 161 150 - 400 10*3/uL    MPV 8.7 7.0 - 11.0 fL    Neutrophils 67.6 41.0 - 77.0 %    Lymphocytes 15.8 (L) 24.0 - 44.0 %    Monocytes 13.7 (H) 4.0 - 12.0 %    Eosinophils 1.9 0.0 - 5.0 %    Basophils 1.0 0.0 - 2.0 %    Absolute Neutrophil Count 5.40 1.80 - 7.00 10*3/uL    Absolute Lymph Count 1.30 1.00 - 4.80 10*3/uL    Absolute Monocyte Count 1.10 (H) 0.00 - 0.80 10*3/uL    Absolute Eosinophil Count 0.10 0.00 - 0.45 10*3/uL    Absolute Basophil Count 0.10 0.00 - 0.20 10*3/uL   COMPREHENSIVE METABOLIC PANEL    Collection Time: 11/25/23  5:06 AM   Result Value Ref Range    Sodium 137 137 - 147 mmol/L    Potassium 3.8 3.5 - 5.1 mmol/L    Chloride 87 (L) 98 - 110 mmol/L    Glucose 132 (H) 70 - 100 mg/dL    Blood Urea Nitrogen 91 (H) 7 - 25 mg/dL    Creatinine 3.08 (H) 0.40 - 1.24 mg/dL    Calcium 8.7 8.5 - 65.7 mg/dL    Total Protein 6.9 6.0 - 8.0 g/dL    Total Bilirubin 0.4 0.2 - 1.3 mg/dL    Albumin 3.6 3.5 - 5.0 g/dL    Alk Phosphatase 846 (H) 25 - 110 U/L    AST 29 7 - 40 U/L    ALT 17 7 - 56 U/L    CO2 39 (H) 21 - 30 mmol/L    Anion Gap 11 3 - 12    Glomerular Filtration Rate (GFR) 25 (L) >60 mL/min   MAGNESIUM    Collection Time: 11/25/23  5:06 AM   Result Value Ref Range    Magnesium  2.3 1.6 - 2.6 mg/dL   PHOSPHORUS    Collection Time: 11/25/23  5:06 AM   Result Value Ref Range    Phosphorus 5.0 (H) 2.0 - 4.5 mg/dL     Glucose: (!) 782 (95/62/13 0506)  Pertinent radiology reviewed.    Sydnee Levans, DO  Pager [phone]

## 2023-11-25 NOTE — Progress Notes
 RT Adult Assessment Note    NAME:Jacob Dickerson             MRN: [id number]             DOB:1945/12/24          AGE: 78 y.o.  ADMISSION DATE: 11/20/2023             DAYS ADMITTED: LOS: 5 days    Additional Comments:  Impressions of the patient: patient sitting in room, vitals stable  Intervention(s)/outcome(s): continue current plan  Patient education that was completed: none  Recommendations to the care team: none    Vital Signs:  Pulse: 101  RR: 16 PER MINUTE  SpO2: 95 %  O2 Device: Nasal cannula  Liter Flow: 2 Lpm  O2%:      Breath Sounds:      Respiratory Effort:      Comments:

## 2023-11-26 MED ORDER — MORPHINE 2 MG/ML IV SYRG
1 mg | Freq: Once | INTRAVENOUS | 0 refills | Status: CP
Start: 2023-11-26 — End: ?
  Administered 2023-11-26: 1 mg via INTRAVENOUS

## 2023-11-26 MED ORDER — TORSEMIDE 20 MG PO TAB
60 mg | Freq: Two times a day (BID) | ORAL | 0 refills | Status: DC
Start: 2023-11-26 — End: 2023-11-27
  Administered 2023-11-26 – 2023-11-27 (×3): 60 mg via ORAL

## 2023-11-26 NOTE — Progress Notes
 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  Vital signs in the ER showed slight hypoxemia/stable creatinine elevated NT proBNP at 2339.  Chest x-ray obtained on admission showed cardiomegaly/worsening interstitial opacities compared to last chest x-ray.     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 Sandria Manly.  Most recent echo LVH well-seated aortic valve.  Prior to admission medication regimen:  Eliquis, aspirin, Lipitor 20, Jardiance, metolazone every Friday, Toprol 100 daily, Aldactone 25 daily and torsemide 40 mg twice daily.  - The patient is presenting with bilateral lower extremity edema fluid overload/shortness of breath.  His weight now is up to 303, he says he was as low as 280 when he was dry  -Patient does not know the majority of his medications, he lives in an assisted living facility and the medications are often just brought to them and he also feels like there is not much flexibility in his overall meals  -2D echocardiogram had poor visualization of the LV, EF was estimated at 48% there is severely dilated LA and RA  Plan:  > Cardiology was consulted, had signed off on 3/1 with recommendation transition to p.o. diuresis on 3/2.  However, with Cr increased had given a diuretic holiday. Weight has increased steadily and Cardiology re-engaged on 3/3 and have placed on PO diuretic, his creatinine continued to increase so diuretic once again held on 3/4  > Cards ordered gentle IVF x 1 on 3/4 and had improvement in creatinine.  Cardiology recommending starting torsemide on 3/6+ metolazone Mondays and Fridays moving forward with potassium replacement on the days he takes metolazone  - Continue Jardiance/Toprol  > Hold pta aldactone due to renal function  > at time of discharge has fu with PCP < 1 week after     AKI on CKD stage III  - Will monitor Cr with diuresis, baseline creatinine approximately 1.8-2, Cr increased to 2.5 on 3/2  - Continue Gabapentin for neuropathy  - Hold Aldactone due to renal function     Paroxysmal atrial fibrillation  Atypical chest pain -intermittent for several months  - Continue anticoagulation with Eliquis, monitor on telemetry  -  Continue PTA metoprolol  - Last ischemic evaluation was in November 2022 when he was noted to have nonobstructive coronary disease and medical management was recommended  - Reports atypical chest pain that is described of left-sided chest tightness as well as dyspnea this is atypical in the sense that this also is reproducible at times when he presses on his left chest  - Cardiology follow-up given atypical nature of the chest pain do not suspect ischemic etiology warranting any ischemic work up  - Lidocaine patches prn     COPD stage  Acute on chronic hypoxemic respiratory failure  - Baseline is 2 L of oxygen,, presentation did require 3 to 4 L transiently suspected likely secondary to fluid overload.  Did improve back to 2 L with diuresis  - No wheezing on exam  - Duonebs PRN     Mood disorder  - Continue Depakote and sertraline     Hypothyroidism-continue PTA Synthroid  BPH-continue PTA tamsulosin     Code: DNAR-FI, see ACP note from 3/1    Dispo: Continued patient hospitalization    Patient was discussed at multidisciplinary rounds which included nurse, pharmacist, social work, and nurse case management who provided additional information that affected the patient's care.    High medical decision making due to the following:  drug therapy requiring intensive monitoring for toxicity, IV controlled substances, and decision regarding hospitalization     Note to patient: The 21st Century Cures Act makes medical notes like these available to patients in the interest of transparency. However, be advised this is a medical document. It is intended as peer to peer communication. It is written in medical language and may contain abbreviations or verbiage that are unfamiliar. It may appear blunt or direct. Medical documents are intended to carry relevant information, facts as evident, and the clinical opinion of the practitioner.    Clearence Ped, DO  Pager [phone]    __________________________________________________________________________________  Subjective: Jacob Dickerson is a 78 y.o. male no acute events overnight, his leg still felt tight but overall stable and improved from when he first came.  Denies any chest pain or dyspnea.  He continues to have chronic back pain that remains unchanged.  No nausea or emesis.    Medications:  Scheduled Meds:allopurinoL (ZYLOPRIM) tablet 100 mg, 100 mg, Oral, BID  apixaban (ELIQUIS) tablet 5 mg, 5 mg, Oral, BID  atorvastatin (LIPITOR) tablet 20 mg, 20 mg, Oral, QDAY  divalproex (DEPAKOTE) DR tablet 750 mg, 750 mg, Oral, QHS  empagliflozin (JARDIANCE) tablet 10 mg, 10 mg, Oral, QDAY  ezetimibe (ZETIA) tablet 10 mg, 10 mg, Oral, QDAY  gabapentin (NEURONTIN) capsule 200 mg, 200 mg, Oral, BID  levothyroxine (SYNTHROID) tablet 75 mcg, 75 mcg, Oral, QDAY 30 min before breakfast  lidocaine (LIDODERM) 5 % topical patch 2 patch, 2 patch, Topical, QDAY  metoprolol succinate XL (TOPROL XL) tablet 100 mg, 100 mg, Oral, QDAY  sertraline (ZOLOFT) tablet 50 mg, 50 mg, Oral, QAM8  [Held by Provider] spironolactone (ALDACTONE) tablet 25 mg, 25 mg, Oral, QDAY  tamsulosin (FLOMAX) capsule 0.4 mg, 0.4 mg, Oral, QAM8  torsemide (DEMADEX) tablet 60 mg, 60 mg, Oral, BID(9-17)    Continuous Infusions:  PRN and Respiratory Meds:acetaminophen Q6H PRN, albuterol-ipratropium Q4H PRN, alum-mag hydroxide-simeth Q4H PRN **AND** lidocaine hcl viscous Q4H PRN, artificial tears (PF) single dose PRN, eucalyptus-menthoL Q2H PRN, fluticasone propionate BID PRN, melatonin QHS PRN, methocarbamoL Q8H PRN, ondansetron Q6H PRN **OR** ondansetron (ZOFRAN) IV Q6H PRN, polyethylene glycol 3350 QDAY PRN,  sennosides-docusate sodium QDAY PRN      Physical Exam:  Vital Signs: Last Filed In 24 Hours Vital Signs: 24 Hour Range   BP: 142/73 (03/06 1133)  Temp: 36.2 ?C (97.1 ?F) (03/06 1133)  Pulse: 85 (03/06 1133)  Respirations: 21 PER MINUTE (03/06 1133)  SpO2: 92 % (03/06 1133)  O2 Device: None (Room air) (03/06 1133)  O2 Liter Flow: 2 Lpm (03/06 0735) BP: (104-142)/(56-73)   Temp:  [36.2 ?C (97.1 ?F)-37.2 ?C (98.9 ?F)]   Pulse:  [84-101]   Respirations:  [16 PER MINUTE-21 PER MINUTE]   SpO2:  [92 %-97 %]   O2 Device: None (Room air)  O2 Liter Flow: 2 Lpm   Intensity Pain Scale (Self Report): 5 (11/26/23 0900)      General:  Alert, cooperative, no distress, appears stated age  Neck: Unable to appreciate JVD due to body habitus  Lungs:  Clear to auscultation bilaterally  Chest wall: Chest pain elicited with palpation in the left chest wall  Heart:  RRR, S1, S2, no other murmurs  Abdomen:  (+)BS, soft, nt/nd  Extremities: 1+ lower extremity edema    Lab/Radiology/Other Diagnostic Tests:  24-hour labs:    Results for orders placed or performed during the hospital encounter of 11/20/23 (from the past 24 hours)   CBC AND DIFF    Collection Time: 11/26/23  4:59 AM   Result Value Ref Range    White Blood Cells 7.90 4.50 - 11.00 10*3/uL    Red Blood Cells 4.50 4.40 - 5.50 10*6/uL    Hemoglobin 14.6 13.5 - 16.5 g/dL    Hematocrit 13.0 86.5 - 50.0 %    MCV 97.5 80.0 - 100.0 fL    MCH 32.5 26.0 - 34.0 pg    MCHC 33.3 32.0 - 36.0 g/dL    RDW 78.4 (H) 69.6 - 15.0 %    Platelet Count 177 150 - 400 10*3/uL    MPV 9.3 7.0 - 11.0 fL    Neutrophils 61.1 41.0 - 77.0 %    Lymphocytes 22.1 (L) 24.0 - 44.0 %    Monocytes 14.1 (H) 4.0 - 12.0 %    Eosinophils 2.2 0.0 - 5.0 %    Basophils 0.5 0.0 - 2.0 %    Absolute Neutrophil Count 4.80 1.80 - 7.00 10*3/uL    Absolute Lymph Count 1.70 1.00 - 4.80 10*3/uL    Absolute Monocyte Count 1.10 (H) 0.00 - 0.80 10*3/uL    Absolute Eosinophil Count 0.20 0.00 - 0.45 10*3/uL    Absolute Basophil Count 0.00 0.00 - 0.20 10*3/uL   COMPREHENSIVE METABOLIC PANEL    Collection Time: 11/26/23  4:59 AM   Result Value Ref Range    Sodium 140 137 - 147 mmol/L    Potassium 4.1 3.5 - 5.1 mmol/L    Chloride 89 (L) 98 - 110 mmol/L    Glucose 88 70 - 100 mg/dL    Blood Urea Nitrogen 91 (H) 7 - 25 mg/dL    Creatinine 2.95 (H) 0.40 - 1.24 mg/dL    Calcium 9.5 8.5 - 28.4 mg/dL    Total Protein 7.4 6.0 - 8.0 g/dL    Total Bilirubin 0.5 0.2 - 1.3 mg/dL    Albumin 3.7 3.5 - 5.0 g/dL    Alk Phosphatase 132 (H) 25 - 110 U/L    AST 30 7 - 40 U/L    ALT 15 7 - 56 U/L    CO2 43 (H) 21 - 30 mmol/L  Anion Gap 8 3 - 12    Glomerular Filtration Rate (GFR) 28 (L) >60 mL/min   MAGNESIUM    Collection Time: 11/26/23  4:59 AM   Result Value Ref Range    Magnesium 2.5 1.6 - 2.6 mg/dL   PHOSPHORUS    Collection Time: 11/26/23  4:59 AM   Result Value Ref Range    Phosphorus 4.5 2.0 - 4.5 mg/dL     Glucose: 88 (16/10/96 0459)  Pertinent radiology reviewed.    Sydnee Levans, DO  Pager [phone]

## 2023-11-26 NOTE — Progress Notes
 [facility] 45 END OF SHIFT/ JHFRAT NOTE    Admission Date: 11/20/2023    Acute events, interventions, provider communication: NA  Patient Interventions and Education  Fall Risk/JHFRAT Interventions and Education: (Charting when applicable)  Elimination Interventions : Place male/male urinal within reach   Medications : Educate patient on medication side effects  Patient Care Equipment: Needs assistance with patient care equipment when ambulating and Ensure environment is free of clutter and walkways are clear from tripping hazards  Mobility: Assist x1  Cognition: N/A  Risk for Moderate/Major Injury: Age: >65 yrs    2. Restraints:  No     Restraint Goal: Patient will be free from injury while physically restrained.  See Docflowsheet for restraint documentation, interventions, education, etc.    Intake and Output:       Intake/Output Summary (Last 24 hours) at 11/26/2023 1546  Last data filed at 11/26/2023 1358  Gross per 24 hour   Intake 1160 ml   Output 2000 ml   Net -840 ml              Last Bowel Movement Date: 11/24/23

## 2023-11-26 NOTE — Progress Notes
 [facility] 45 END OF SHIFT/ JHFRAT NOTE    Admission Date: 11/20/2023    Acute events, interventions, provider communication: n/a    Patient Interventions and Education  Fall Risk/JHFRAT Interventions and Education: (Charting when applicable)  Elimination Interventions : Commode at bedside and Place male/male urinal within reach   Medications : Bedside commode (i.e., urgency, frequency, dizziness)  and Educate patient on medication side effects  Patient Care Equipment: N/A  Mobility: Assist x1, Gait belt in use when ambulating, Utilize walker, cane, or additional walking aid for ambulation, and Elimination equipment at bedside (urinal or commode)   Cognition: N/A  Risk for Moderate/Major Injury: Age: >65 yrs    2. Restraints:  No     Restraint Goal: Patient will be free from injury while physically restrained.  See Docflowsheet for restraint documentation, interventions, education, etc.    Intake and Output:       Intake/Output Summary (Last 24 hours) at 11/26/2023 0639  Last data filed at 11/26/2023 0414  Gross per 24 hour   Intake 674 ml   Output 1655 ml   Net -981 ml              Last Bowel Movement Date: 11/24/23

## 2023-11-26 NOTE — Case Management (ED)
 Case Management Progress Note    NAME:Gunther A Kaivon Livesey                          MRN: [id number]              DOB:1946/03/23          AGE: 78 y.o.  ADMISSION DATE: 11/20/2023             DAYS ADMITTED: LOS: 6 days      Today's Date: 11/26/2023    PLAN: Patient anticipated to discharge home tomorrow with no identified NCM needs.    Expected Discharge Date: 11/27/2023 2:00 PM  Is Patient Medically Stable: No, Please explain: changed to PO  diuretics.  Are there Barriers to Discharge? No    INTERVENTION/DISPOSITION:  Discharge Planning              Discharge Planning: No Needs Identified  NCM reviewed EMR.  NCM attended and participated in [facility] huddle.  Patient anticipated to be stable for discharge home tomorrow. Patient changed to PO diuretics today for anticipated discharge tomorrow.  PT/OT following and not currently recommending any post acute service needs/DME.  No identified NCM needs upon discharge. Will continue to follow.  Transportation              Does the Patient Need Case Management to Arrange Discharge Transport? (ex: facility, ambulance, wheelchair/stretcher, Medicaid, cab, other): Yes  Type of Transport: Medicaid  Will the Patient Use Family Transport?: No  Support              Support: Huddle/team update, No needs identified  Info or Referral              Information or Referral to [facility]: No Needs Identified  Positive SDOH Domains and Potential Barriers     Medication Needs              Medication Needs: No Needs Identified  Financial              Financial: No Needs Identified  Legal              Legal: No Needs Identified  Other              Other/None: No needs identified  Discharge Disposition                                Selected Continued Care - Admitted Since 11/20/2023    No services have been selected for the patient.       Lilli Few, BSN, RN  Med [facility] Nurse Case Manager  [phone] and available on [place]

## 2023-11-26 NOTE — Progress Notes
 1730 RN notified provider Regan that patient was having chest pain and back pain. Pt stated that is felt like pressure on his chest. Pts vitals were stable. O2 however was 86% on room air. RN placed patient back on 2L NC, and O2 went up to 93%.   Provider ordered a 1x dose of morphine.

## 2023-11-27 ENCOUNTER — Encounter: Admit: 2023-11-27 | Discharge: 2023-11-27

## 2023-11-27 DIAGNOSIS — Z953 Presence of xenogenic heart valve: Secondary | ICD-10-CM

## 2023-11-27 DIAGNOSIS — Q2381 Bicuspid aortic valve: Secondary | ICD-10-CM

## 2023-11-27 DIAGNOSIS — H9193 Unspecified hearing loss, bilateral: Secondary | ICD-10-CM

## 2023-11-27 DIAGNOSIS — E781 Pure hyperglyceridemia: Secondary | ICD-10-CM

## 2023-11-27 DIAGNOSIS — Z79899 Other long term (current) drug therapy: Secondary | ICD-10-CM

## 2023-11-27 DIAGNOSIS — Z7982 Long term (current) use of aspirin: Secondary | ICD-10-CM

## 2023-11-27 DIAGNOSIS — G629 Polyneuropathy, unspecified: Secondary | ICD-10-CM

## 2023-11-27 DIAGNOSIS — M7989 Other specified soft tissue disorders: Secondary | ICD-10-CM

## 2023-11-27 DIAGNOSIS — N179 Acute kidney failure, unspecified: Secondary | ICD-10-CM

## 2023-11-27 DIAGNOSIS — Z7901 Long term (current) use of anticoagulants: Secondary | ICD-10-CM

## 2023-11-27 DIAGNOSIS — M1710 Unilateral primary osteoarthritis, unspecified knee: Secondary | ICD-10-CM

## 2023-11-27 DIAGNOSIS — J9621 Acute and chronic respiratory failure with hypoxia: Secondary | ICD-10-CM

## 2023-11-27 DIAGNOSIS — Z6841 Body Mass Index (BMI) 40.0 and over, adult: Secondary | ICD-10-CM

## 2023-11-27 DIAGNOSIS — Z7984 Long term (current) use of oral hypoglycemic drugs: Secondary | ICD-10-CM

## 2023-11-27 DIAGNOSIS — N401 Enlarged prostate with lower urinary tract symptoms: Secondary | ICD-10-CM

## 2023-11-27 DIAGNOSIS — E039 Hypothyroidism, unspecified: Secondary | ICD-10-CM

## 2023-11-27 DIAGNOSIS — Z79890 Hormone replacement therapy (postmenopausal): Secondary | ICD-10-CM

## 2023-11-27 DIAGNOSIS — F39 Unspecified mood [affective] disorder: Secondary | ICD-10-CM

## 2023-11-27 DIAGNOSIS — J4489 Other specified chronic obstructive pulmonary disease (HCC): Secondary | ICD-10-CM

## 2023-11-27 DIAGNOSIS — I428 Other cardiomyopathies: Secondary | ICD-10-CM

## 2023-11-27 DIAGNOSIS — G8929 Other chronic pain: Secondary | ICD-10-CM

## 2023-11-27 DIAGNOSIS — Z8249 Family history of ischemic heart disease and other diseases of the circulatory system: Secondary | ICD-10-CM

## 2023-11-27 DIAGNOSIS — Z66 Do not resuscitate: Secondary | ICD-10-CM

## 2023-11-27 DIAGNOSIS — Z87891 Personal history of nicotine dependence: Secondary | ICD-10-CM

## 2023-11-27 DIAGNOSIS — I130 Hypertensive heart and chronic kidney disease with heart failure and stage 1 through stage 4 chronic kidney disease, or unspecified chronic kidney disease: Secondary | ICD-10-CM

## 2023-11-27 DIAGNOSIS — I493 Ventricular premature depolarization: Secondary | ICD-10-CM

## 2023-11-27 DIAGNOSIS — I2510 Atherosclerotic heart disease of native coronary artery without angina pectoris: Secondary | ICD-10-CM

## 2023-11-27 LAB — ECG 12-LEAD: T AXIS: 142 degrees

## 2023-11-27 MED ORDER — MIDODRINE 5 MG PO TAB
5 mg | Freq: Once | ORAL | 0 refills | Status: CP
Start: 2023-11-27 — End: ?
  Administered 2023-11-27: 12:00:00 5 mg via ORAL

## 2023-11-27 MED ORDER — POTASSIUM CHLORIDE 20 MEQ PO TBTQ
40 meq | ORAL_TABLET | Freq: Two times a day (BID) | ORAL | 3 refills | 30.00000 days | Status: AC
Start: 2023-11-27 — End: ?

## 2023-11-27 MED ORDER — EZETIMIBE 10 MG PO TAB
10 mg | ORAL_TABLET | Freq: Every day | ORAL | 1 refills | Status: AC
Start: 2023-11-27 — End: ?

## 2023-11-27 MED ORDER — POTASSIUM CHLORIDE 20 MEQ PO TBTQ
20 meq | ORAL_TABLET | Freq: Two times a day (BID) | ORAL | 3 refills | 30.00000 days | Status: DC
Start: 2023-11-27 — End: 2023-11-27

## 2023-11-27 MED ORDER — METOLAZONE 5 MG PO TAB
5 mg | ORAL_TABLET | ORAL | 3 refills | 84.00000 days | Status: AC
Start: 2023-11-27 — End: ?

## 2023-11-27 MED ORDER — TORSEMIDE 20 MG PO TAB
60 mg | ORAL_TABLET | Freq: Two times a day (BID) | ORAL | 3 refills | 67.50000 days | Status: AC
Start: 2023-11-27 — End: ?

## 2023-11-27 NOTE — Progress Notes
 [facility] 45 END OF SHIFT/ JHFRAT NOTE    Admission Date: 11/20/2023    Acute events, interventions, provider communication: 0510 patient blood pressure low at 87/40  and asymptomatic, Provider Arrie Eastern notified and patient given an order of Midodrine 5 mg one time dose. patient current blood pressure is 98/61 with MAP  of 68    Patient Interventions and Education  Fall Risk/JHFRAT Interventions and Education: (Charting when applicable)  Elimination Interventions : Place male/male urinal within reach   Medications : Use of gait belt  and Educate patient on medication side effects  Patient Care Equipment: Needs assistance with patient care equipment when ambulating, Ensure environment is free of clutter and walkways are clear from tripping hazards, and Assess need for patient equipment and remove if not in use  Mobility: Assist x1, Gait belt in use when ambulating, Utilize walker, cane, or additional walking aid for ambulation, and Elimination equipment at bedside (urinal or commode)   Cognition: N/A  Risk for Moderate/Major Injury: Age: >65 yrs    2. Restraints:  No     Restraint Goal: Patient will be free from injury while physically restrained.  See Docflowsheet for restraint documentation, interventions, education, etc.    Intake and Output:       Intake/Output Summary (Last 24 hours) at 11/27/2023 0555  Last data filed at 11/27/2023 0445  Gross per 24 hour   Intake 598 ml   Output 2100 ml   Net -1502 ml              Last Bowel Movement Date: 11/24/23
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
## 2023-11-27 NOTE — Progress Notes
.  Christen Bame discharged on 11/27/2023.   Marland Kitchen  Discharge instructions reviewed with patient.  Valuables returned: yes  ADL Belongings at Bedside: None.  Home medications: NA   .  Functional assessment at discharge complete: Yes .  RN went over medications and removed patients IV. Patient went home via cab.

## 2023-12-02 ENCOUNTER — Encounter: Admit: 2023-12-02 | Discharge: 2023-12-02

## 2023-12-02 NOTE — Telephone Encounter
 [facility] Discharge Patient    Date of Discharge: 11/27/23    Discharging Physician: Dr. Clearence Ped    Situation:   Marcelino Duster, DON at [facility], calling as patient was prescribed ezetimibe at discharge and they did not receive the medication. Advised that the prescription was sent to [facility] on 11/27/23. Marcelino Duster will call them to have the medication sent over.    Follow Up Required: No

## 2023-12-04 ENCOUNTER — Encounter: Admit: 2023-12-04 | Discharge: 2023-12-04 | Payer: MEDICARE

## 2023-12-07 ENCOUNTER — Encounter: Admit: 2023-12-07 | Discharge: 2023-12-07 | Payer: MEDICARE

## 2023-12-07 DIAGNOSIS — I5040 Unspecified combined systolic (congestive) and diastolic (congestive) heart failure: Secondary | ICD-10-CM

## 2023-12-07 MED ORDER — TORSEMIDE 20 MG PO TAB
40 mg | ORAL_TABLET | Freq: Two times a day (BID) | ORAL | 3 refills
Start: 2023-12-07 — End: ?

## 2023-12-25 ENCOUNTER — Encounter: Admit: 2023-12-25 | Discharge: 2023-12-25

## 2023-12-25 ENCOUNTER — Ambulatory Visit: Admit: 2023-12-25 | Discharge: 2023-12-25

## 2023-12-28 ENCOUNTER — Encounter: Admit: 2023-12-28 | Discharge: 2023-12-28

## 2023-12-28 DIAGNOSIS — I5040 Unspecified combined systolic (congestive) and diastolic (congestive) heart failure: Secondary | ICD-10-CM

## 2023-12-28 DIAGNOSIS — I5043 Acute on chronic combined systolic (congestive) and diastolic (congestive) heart failure: Secondary | ICD-10-CM

## 2023-12-28 MED ORDER — TORSEMIDE 20 MG PO TAB
40 mg | Freq: Two times a day (BID) | ORAL | 0 refills | 67.50000 days | Status: AC
Start: 2023-12-28 — End: ?

## 2023-12-28 MED ORDER — METOLAZONE 5 MG PO TAB
5 mg | ORAL | 0 refills | 84.00000 days | Status: AC
Start: 2023-12-28 — End: ?

## 2023-12-28 NOTE — Telephone Encounter
 Spoke with Moira Andrews, nurse at [facility]. Gave instructions per Jan for decrease of torsemide and metolazone, no change to K+, repeat lab in one week. Orders faxed to [phone]

## 2023-12-30 ENCOUNTER — Encounter: Admit: 2023-12-30 | Discharge: 2023-12-30

## 2024-01-05 ENCOUNTER — Encounter: Admit: 2024-01-05 | Discharge: 2024-01-05 | Payer: MEDICARE

## 2024-01-05 DIAGNOSIS — I5043 Acute on chronic combined systolic (congestive) and diastolic (congestive) heart failure: Secondary | ICD-10-CM

## 2024-01-05 DIAGNOSIS — I5040 Unspecified combined systolic (congestive) and diastolic (congestive) heart failure: Secondary | ICD-10-CM

## 2024-01-05 LAB — BASIC METABOLIC PANEL
ANION GAP: 14
BLD UREA NITROGEN: 83 — ABNORMAL HIGH (ref 8.4–25.7)
CALCIUM: 9.4
CHLORIDE: 89 — ABNORMAL LOW (ref 98–107)
CO2: 33 — ABNORMAL HIGH (ref 23–31)
CREATININE: 2.3 — ABNORMAL HIGH (ref 0.72–1.25)
GFR ESTIMATED: 28 — ABNORMAL LOW (ref >59)
GLUCOSE,PANEL: 180 — ABNORMAL HIGH (ref 70–105)
POTASSIUM: 3.5
SODIUM: 136

## 2024-01-05 MED ORDER — METOLAZONE 5 MG PO TAB
5 mg | ORAL_TABLET | ORAL | 0 refills | 84.00000 days | Status: AC | PRN
Start: 2024-01-05 — End: ?

## 2024-01-05 NOTE — Telephone Encounter
 This RN called patient facility using dual identifiers to discuss recommendations from JA: Let's ask his facility to change the metolazone  to PRN only for weight gain of 3 lbs overnight OR 5 lbs in 1 week. They should be weighing him daily. Repeat BMP in 1 week to follow his labs.    RN spoke to [place], Fish farm manager. RN reviewed recommendations from [facility] with Sherrlyn Dolores. Sherrlyn Dolores confirmed that med orders in facility are received from [facility]. Metolazone  order changes sent to [facility]. RN reviewed that facility should be weighing patient daily and also verified fax number to send lab orders. No further questions or concerns at this time.
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
## 2024-01-11 ENCOUNTER — Ambulatory Visit: Admit: 2024-01-11 | Discharge: 2024-01-11 | Payer: MEDICARE

## 2024-01-11 ENCOUNTER — Encounter: Admit: 2024-01-11 | Discharge: 2024-01-11 | Payer: MEDICARE

## 2024-01-11 DIAGNOSIS — I5022 Chronic systolic (congestive) heart failure: Secondary | ICD-10-CM

## 2024-01-11 DIAGNOSIS — Z952 Presence of prosthetic heart valve: Secondary | ICD-10-CM

## 2024-01-11 DIAGNOSIS — M47816 Spondylosis without myelopathy or radiculopathy, lumbar region: Secondary | ICD-10-CM

## 2024-01-11 DIAGNOSIS — R399 Unspecified symptoms and signs involving the genitourinary system: Secondary | ICD-10-CM

## 2024-01-11 DIAGNOSIS — I10 Essential (primary) hypertension: Secondary | ICD-10-CM

## 2024-01-11 DIAGNOSIS — N1831 Chronic kidney disease, stage 3a (CMS-HCC): Secondary | ICD-10-CM

## 2024-01-11 DIAGNOSIS — M4306 Spondylolysis, lumbar region: Secondary | ICD-10-CM

## 2024-01-11 DIAGNOSIS — G8929 Other chronic pain: Secondary | ICD-10-CM

## 2024-01-11 DIAGNOSIS — M47812 Spondylosis without myelopathy or radiculopathy, cervical region: Secondary | ICD-10-CM

## 2024-01-11 DIAGNOSIS — M5489 Other dorsalgia: Secondary | ICD-10-CM

## 2024-01-11 NOTE — Progress Notes
 History      CC: CKD    HPI: Returns to clinic for routine follow up. He was recently hospitalized from 2/28-3/7 with decompensated heart failure. Improved with IV diuretics. Renal function was stable during that admission. Recent follow up labs on 4/15 show stable renal function with Cr 2.3mg /dL and no electrolyte abnormalities. He reports his weights are stable. He has no episodes of hypotension. He does have some intermittent sharp pain in his penis with associated lower abdominal pressure. No problems passing urine. Feels like he is able to empty his bladder fully. No hematuria.            Medication    HOME MEDS   acetaminophen  (TYLENOL  EXTRA STRENGTH) 500 mg tablet Take one tablet by mouth every 4 hours as needed for Pain. Max of 4,000 mg of acetaminophen  in 24 hours.    allopurinoL  (ZYLOPRIM ) 100 mg tablet Take one tablet by mouth twice daily. Indications: treatment to prevent acute gout attack    aluminum-magnesium  hydroxide-simethicone (MAALOX MAXIMUM STRENGTH) 400-400-40 mg/5 mL suspension Take 15 mL by mouth every 6 hours as needed.    apixaban  (ELIQUIS ) 5 mg tablet Take one tablet by mouth twice daily.    atorvastatin  (LIPITOR) 10 mg tablet Take one tablet by mouth daily.    divalproex  (DEPAKOTE  EC) 250 mg DR tablet Take three tablets by mouth at bedtime daily. Take with food.    docusate (COLACE) 100 mg capsule Take one capsule by mouth twice daily as needed.    empagliflozin  (JARDIANCE ) 10 mg tablet TAKE ONE TABLET BY MOUTH EVERY DAY    ERGOcalciferoL  (vitamin D2) (DRISDOL ) 1,250 mcg (50,000 unit) capsule Take one capsule by mouth every 7 days. Indications: vitamin D  deficiency (high dose therapy)    ezetimibe  (ZETIA ) 10 mg tablet Take one tablet by mouth daily. Indications: excessive fat in the blood    fluticasone  propionate (FLONASE ) 50 mcg/actuation nasal spray, suspension Apply two sprays to each nostril as directed every morning.    gabapentin  (NEURONTIN ) 100 mg capsule Take two capsules by mouth twice daily.    levothyroxine  (SYNTHROID ) 75 mcg tablet Take one tablet by mouth daily 30 minutes before breakfast.    lidocaine  (LIDODERM ) 5 % topical patch Apply two patches topically to affected area daily. Apply patch for 12 hours, then remove for 12 hours before repeating.    metOLazone  (ZAROXOLYN ) 5 mg tablet Take one tablet by mouth as Needed. Take only for weight gain of 3 lbs overnight or 5 lbs in one week.    metoprolol  succinate XL (TOPROL  XL) 100 mg extended release tablet TAKE ONE TABLET BY MOUTH EVERY DAY    milk of magnesium  400 mg/5 mL oral suspension Take 30 mL by mouth every 24 hours as needed for Heartburn.    nystatin (MYCOSTATIN) 100,000 unit/g topical cream Apply  topically to affected area twice daily.    nystatin (NYSTOP) 100,000 unit/g topical powder Apply  topically to affected area twice daily.    potassium chloride  SR (K-DUR) 20 mEq tablet Take two tablets by mouth twice daily. TAKE TWO TABLETS(63meq) BY MOUTH TWICE DAILY WITH MEALS and a full glass of water ; TAKE 1 extra TABLET(49meq)on mondays and fridays with Metolazone   Indications: prevention of low potassium in the blood    sertraline  (ZOLOFT ) 50 mg tablet Take one tablet by mouth every morning.    tamsulosin  (FLOMAX ) 0.4 mg capsule Take one capsule by mouth every morning. Do not crush, chew or open capsules. Take 30 minutes  following the same meal each day.    torsemide (DEMADEX) 20 mg tablet Take two tablets by mouth twice daily. Indications: accumulation of fluid resulting from chronic heart failure            Physical Exam        Vitals:    01/11/24 0849   BP: 116/62   BP Source: Arm, Left Upper   Pulse: 112   PainSc: Zero   Weight: 132.5 kg (292 lb)   Height: 165.1 cm (5' 5)     Body mass index is 48.59 kg/m?Jacob Dickerson    GEN: obese male, alert  HEENT: MMM  NECK: no JVD  HEART: irreg rhythm, no murmurs  LUNGS: CTAB, nonlabored respirations on room air  ABD: soft, nondistended  EXT: trace LE edema, radial pulses 2+  SKIN: warm, dry  NEURO: follows commands    Basic Metabolic Profile    Lab Results   Component Value Date/Time    NA 136 01/05/2024 12:00 AM    K 3.5 01/05/2024 12:00 AM    CA 9.4 01/05/2024 12:00 AM    CL 89 (L) 01/05/2024 12:00 AM    CO2 33 (H) 01/05/2024 12:00 AM    GAP 14 01/05/2024 12:00 AM    Lab Results   Component Value Date/Time    BUN 83.8 (H) 01/05/2024 12:00 AM    CR 2.32 (H) 01/05/2024 12:00 AM    GLU 180 (H) 01/05/2024 12:00 AM        CBC w diff    Lab Results   Component Value Date/Time    WBC 7.20 11/27/2023 04:36 AM    RBC 4.39 (L) 11/27/2023 04:36 AM    HGB 14.0 11/27/2023 04:36 AM    HCT 42.5 11/27/2023 04:36 AM    MCV 96.8 11/27/2023 04:36 AM    MCH 31.9 11/27/2023 04:36 AM    MCHC 32.9 11/27/2023 04:36 AM    RDW 17.5 (H) 11/27/2023 04:36 AM    PLTCT 160 11/27/2023 04:36 AM    MPV 8.8 11/27/2023 04:36 AM    Lab Results   Component Value Date/Time    NEUT 62.1 11/27/2023 04:36 AM    ANC 4.50 11/27/2023 04:36 AM    LYMA 20.5 (L) 11/27/2023 04:36 AM    ALC 1.50 11/27/2023 04:36 AM    MONA 14.0 (H) 11/27/2023 04:36 AM    AMC 1.00 (H) 11/27/2023 04:36 AM    EOSA 2.5 11/27/2023 04:36 AM    AEC 0.20 11/27/2023 04:36 AM    BASA 0.9 11/27/2023 04:36 AM    ABC 0.10 11/27/2023 04:36 AM              Assessment and Plan        Jacob Dickerson is a 78 y.o. male with HTN, HLD, Afib, CAD, HFpEF, bicuspid aortic valve s/p AVR/TAVR, and CKD-stage 3 presenting for routine follow up.    CKD-stage3a  - CKD likely the result of cardiorenal physiology with recurrent admissions for fluid overload  - no proteinuria or hematuria  - ultrasound shows normal sized kidneys with slight cortical thinning and mild echogenicity; 6cm right simple cyst noted; no hydronephrosis or stones  - variable creatinine as expected with a cardiorenal state; baseline Cr around 1.8-2.0mg /dL  - stable renal function on recent labs with Cr 2.3mg /dL; may have some mild CKD progression over the last year    Hypertension  - BP well controlled  - currently on metoprolol, torsemide; also on empagliflozin  - previously on  lisinopril (on hold since admission for AKI)    Volume status  - weight stable  - on torsemide  60mg  bid, metolazone  5mg  once weekly    BPH  LUTS  - symptoms managed well with tamsulosin   - unclear cause for his lower abdominal fullness and intermittent pain in his penis; no stones on last renal imaging, though this has not been evaluated for some time now    Bicuspid AV  - AVR in 2009  - s/p TAVR in November 2022    HFrEF  - EF 48% on last echo  - on torsemide , metoprolol , empagliflozin     Afib  - rate controlled with metoprolol   - AC apixiban    Chronic respiratory failure  - not using nocturnal O2 currently        PLAN  - reviewed available lab results with patient and discussed risk factors for CKD progression  - obtain renal ultrasound for evaluation of any prostate enlargement, obstruction, or stones  - continue current antihypertensive regimen; goal systolic 100-120  - continue torsemide  and metolazone  while monitoring daily weight to determine need for additional diuretic doses  - hold on use of lisinopril given his mild hypotension   - continue empagliflozin ; discussed holding this medication on days that his oral intake is limited to avoid risk for AKI and ketosis  - discussed low sodium diet considerations  - avoid all NSAID use    RTC 6 months    Tarry Farmer  pager [phone]

## 2024-01-14 ENCOUNTER — Encounter: Admit: 2024-01-14 | Discharge: 2024-01-14 | Payer: MEDICARE

## 2024-01-26 ENCOUNTER — Encounter: Admit: 2024-01-26 | Discharge: 2024-01-26 | Payer: MEDICARE

## 2024-02-12 ENCOUNTER — Encounter: Admit: 2024-02-12 | Discharge: 2024-02-12 | Payer: MEDICARE

## 2024-02-12 ENCOUNTER — Ambulatory Visit: Admit: 2024-02-12 | Discharge: 2024-02-12 | Payer: MEDICARE

## 2024-05-02 ENCOUNTER — Encounter: Admit: 2024-05-02 | Discharge: 2024-05-02 | Payer: MEDICARE

## 2024-05-03 ENCOUNTER — Encounter: Admit: 2024-05-03 | Discharge: 2024-05-03 | Payer: MEDICARE

## 2024-08-22 ENCOUNTER — Encounter: Admit: 2024-08-22 | Discharge: 2024-08-22 | Payer: MEDICARE

## 2024-08-23 ENCOUNTER — Encounter: Admit: 2024-08-23 | Discharge: 2024-08-23 | Payer: MEDICARE

## 2024-08-23 ENCOUNTER — Emergency Department: Admit: 2024-08-23 | Discharge: 2024-08-23 | Payer: MEDICARE

## 2024-08-23 DIAGNOSIS — R1084 Generalized abdominal pain: Secondary | ICD-10-CM

## 2024-08-23 DIAGNOSIS — I5021 Acute systolic (congestive) heart failure: Secondary | ICD-10-CM

## 2024-08-23 DIAGNOSIS — Z952 Presence of prosthetic heart valve: Secondary | ICD-10-CM

## 2024-08-23 DIAGNOSIS — K922 Gastrointestinal hemorrhage, unspecified: Secondary | ICD-10-CM

## 2024-08-23 DIAGNOSIS — R195 Other fecal abnormalities: Secondary | ICD-10-CM

## 2024-08-23 DIAGNOSIS — N1832 Stage 3b chronic kidney disease (CMS-HCC): Secondary | ICD-10-CM

## 2024-08-23 DIAGNOSIS — I4819 Other persistent atrial fibrillation: Secondary | ICD-10-CM

## 2024-08-23 DIAGNOSIS — I5040 Unspecified combined systolic (congestive) and diastolic (congestive) heart failure: Secondary | ICD-10-CM

## 2024-08-23 DIAGNOSIS — K921 Melena: Principal | ICD-10-CM

## 2024-08-23 LAB — CBC AND DIFF
~~LOC~~ BKR ABSOLUTE BASO COUNT: 0.1 10*3/uL (ref 0.00–0.20)
~~LOC~~ BKR ABSOLUTE BASO COUNT: 0.1 10*3/uL (ref 0.00–0.20)
~~LOC~~ BKR ABSOLUTE EOS COUNT: 0.3 10*3/uL (ref 0.00–0.45)
~~LOC~~ BKR ABSOLUTE EOS COUNT: 0.3 10*3/uL (ref 0.00–0.45)
~~LOC~~ BKR ABSOLUTE LYMPH COUNT: 1.2 10*3/uL — ABNORMAL LOW (ref 1.00–4.80)
~~LOC~~ BKR ABSOLUTE LYMPH COUNT: 1.4 10*3/uL (ref 1.00–4.80)
~~LOC~~ BKR ABSOLUTE MONO COUNT: 0.9 10*3/uL — ABNORMAL HIGH (ref 0.00–0.80)
~~LOC~~ BKR ABSOLUTE NEUTROPHIL: 6.5 10*3/uL — ABNORMAL HIGH (ref 1.80–7.00)
~~LOC~~ BKR ABSOLUTE NEUTROPHIL: 6.4 10*3/uL (ref 1.80–7.00)
~~LOC~~ BKR BASOPHILS %: 0.6 % — ABNORMAL HIGH (ref 0.0–2.0)
~~LOC~~ BKR BASOPHILS %: 0.7 % (ref 0.0–2.0)
~~LOC~~ BKR EOSINOPHILS %: 3.1 % (ref 0.0–5.0)
~~LOC~~ BKR HEMOGLOBIN: 14 g/dL — ABNORMAL HIGH (ref 13.5–16.5)
~~LOC~~ BKR LYMPHOCYTES %: 15 % — ABNORMAL LOW (ref 24.0–44.0)
~~LOC~~ BKR MCH: 30 pg (ref 26.0–34.0)
~~LOC~~ BKR MDW (MONOCYTE DISTRIBUTION WIDTH): 20 (ref <=20.6)
~~LOC~~ BKR MDW (MONOCYTE DISTRIBUTION WIDTH): 20 (ref <=20.6)
~~LOC~~ BKR MONOCYTES %: 9.8 % (ref 4.0–12.0)
~~LOC~~ BKR MPV: 9 fL (ref 7.0–11.0)
~~LOC~~ BKR NEUTROPHILS %: 71 % (ref 41.0–77.0)
~~LOC~~ BKR RBC COUNT: 4.8 10*6/uL (ref 4.40–5.50)
~~LOC~~ BKR WBC COUNT: 9 10*3/uL (ref 4.50–11.00)

## 2024-08-23 LAB — BASIC METABOLIC PANEL
~~LOC~~ BKR ANION GAP: 12 % — ABNORMAL HIGH (ref 3–12)
~~LOC~~ BKR BLD UREA NITROGEN: 95 mg/dL — ABNORMAL HIGH (ref 7–25)
~~LOC~~ BKR CHLORIDE: 94 mmol/L — ABNORMAL LOW (ref 98–110)
~~LOC~~ BKR CO2: 34 mmol/L — ABNORMAL HIGH (ref 21–30)
~~LOC~~ BKR CREATININE: 2.9 mg/dL — ABNORMAL HIGH (ref 0.40–1.24)
~~LOC~~ BKR GLOMERULAR FILTRATION RATE (GFR): 21 mL/min — ABNORMAL LOW (ref >60–400)
~~LOC~~ BKR POTASSIUM: 3.5 mmol/L (ref 3.5–5.1)
~~LOC~~ BKR SODIUM, SERUM: 140 mmol/L (ref 137–147)

## 2024-08-23 LAB — KC ED MAIN ECG TRIAGE ONLY
Q-T INTERVAL: 438 ms
QRS DURATION: 134 ms
QTC CALCULATION (BAZETT): 508 ms
R AXIS: -37 degrees
T AXIS: 64 degrees
VENTRICULAR RATE: 81 {beats}/min

## 2024-08-23 LAB — SODIUM-URINE RANDOM: ~~LOC~~ BKR UR SODIUM, RAN: 39 mmol/L

## 2024-08-23 LAB — PROTIME INR (PT)
~~LOC~~ BKR INR: 1.8 — ABNORMAL HIGH (ref 0.9–1.2)
~~LOC~~ BKR PROTIME: 20 s — ABNORMAL HIGH (ref 9.9–14.2)

## 2024-08-23 LAB — HIGH SENSITIVITY TROPONIN I 0 HOUR: ~~LOC~~ BKR HIGH SENSITIVITY TROPONIN I 0 HOUR: 42 ng/L — ABNORMAL HIGH (ref 0.00–<20.0)

## 2024-08-23 LAB — IRON + BINDING CAPACITY + %SAT+ FERRITIN
~~LOC~~ BKR % SATURATION: 15 % — ABNORMAL LOW (ref 28–42)
~~LOC~~ BKR FERRITIN: 317 ng/mL — ABNORMAL HIGH (ref 30–300)
~~LOC~~ BKR IRON BINDING: 381 ug/dL — ABNORMAL HIGH (ref 270–380)
~~LOC~~ BKR IRON: 57 g/dL (ref 50–185)
~~LOC~~ BKR TRANSFERRIN: 256 mg/dL (ref 185–336)

## 2024-08-23 LAB — URINALYSIS DIPSTICK REFLEX TO CULTURE
~~LOC~~ BKR GLUCOSE,UA: NEGATIVE
~~LOC~~ BKR NITRITE: POSITIVE — AB
~~LOC~~ BKR URINE BILE: NEGATIVE /HPF — AB
~~LOC~~ BKR URINE PH: 6 (ref 5.0–8.0)
~~LOC~~ BKR URINE SPEC GRAVITY: 1 (ref 1.005–1.030)

## 2024-08-23 LAB — HIGH SENSITIVITY TROPONIN I 4 HR
~~LOC~~ BKR HI SEN TNI DELTA 4-2: 7.3
~~LOC~~ BKR HIGH SENSITIVITY TROPONIN I 4 HOUR: 44 ng/L — ABNORMAL HIGH (ref <20.0)

## 2024-08-23 LAB — POC CREATININE: ~~LOC~~ BKR POC CREATININE: 3.2 mg/dL — ABNORMAL HIGH (ref 0.4–1.24)

## 2024-08-23 LAB — COMPREHENSIVE METABOLIC PANEL
~~LOC~~ BKR ALBUMIN: 3.9 g/dL — ABNORMAL HIGH (ref 3.5–5.0)
~~LOC~~ BKR ALK PHOSPHATASE: 141 U/L — ABNORMAL HIGH (ref 25–110)
~~LOC~~ BKR ALT: 22 U/L (ref 7–56)
~~LOC~~ BKR AST: 43 U/L — ABNORMAL HIGH (ref 7–40)
~~LOC~~ BKR TOTAL BILIRUBIN: 0.8 mg/dL (ref 0.2–1.3)
~~LOC~~ BKR TOTAL PROTEIN: 7.7 g/dL (ref 6.0–8.0)

## 2024-08-23 LAB — POC LACTATE
~~LOC~~ BKR POC LACTIC ACID: 2.3 mmol/L — ABNORMAL HIGH (ref 0.5–2.0)
~~LOC~~ BKR POC LACTIC ACID: 1.3 mmol/L (ref 0.5–2.0)
~~LOC~~ BKR POC LACTIC ACID: 1 mmol/L (ref 0.5–2.0)

## 2024-08-23 LAB — HIGH SENSITIVITY TROPONIN I 2 HOUR
~~LOC~~ BKR HIGH SENSITIVITY TROPONIN I 2 HOUR: 37 ng/L — ABNORMAL HIGH (ref <20.0)
~~LOC~~ BKR HIGH SENSITIVITY TROPONIN I DELTA VALUE: -5

## 2024-08-23 LAB — TYPE & CROSSMATCH
~~LOC~~ BKR ANTIBODY SCREEN: NEGATIVE pg/mL — ABNORMAL HIGH (ref 40.0–<450)
~~LOC~~ BKR UNITS ORDERED: 0 mg/dL — ABNORMAL HIGH (ref 1.6–2.6)

## 2024-08-23 LAB — CREATININE-URINE RANDOM: ~~LOC~~ BKR UR CREATININE, RAN: 80 mg/dL

## 2024-08-23 LAB — UREA NITROGEN-URINE RANDOM: ~~LOC~~ BKR UR UREA NIT, RAN: 722 mg/dL

## 2024-08-23 MED ORDER — ALLOPURINOL 100 MG PO TAB
100 mg | Freq: Every day | ORAL | 0 refills | Status: DC
Start: 2024-08-23 — End: 2024-08-27
  Administered 2024-08-24 – 2024-08-27 (×4): 100 mg via ORAL

## 2024-08-23 MED ORDER — ATORVASTATIN 10 MG PO TAB
10 mg | Freq: Every day | ORAL | 0 refills | Status: DC
Start: 2024-08-23 — End: 2024-08-27
  Administered 2024-08-23 – 2024-08-27 (×5): 10 mg via ORAL

## 2024-08-23 MED ORDER — EZETIMIBE 10 MG PO TAB
10 mg | Freq: Every day | ORAL | 0 refills | Status: DC
Start: 2024-08-23 — End: 2024-08-27
  Administered 2024-08-23 – 2024-08-27 (×5): 10 mg via ORAL

## 2024-08-23 MED ORDER — DIVALPROEX 500 MG PO TB24
500 mg | Freq: Every evening | ORAL | 0 refills | Status: DC
Start: 2024-08-23 — End: 2024-08-27
  Administered 2024-08-24 – 2024-08-27 (×4): 500 mg via ORAL

## 2024-08-23 MED ORDER — APIXABAN 5 MG PO TAB
5 mg | Freq: Two times a day (BID) | ORAL | 0 refills | Status: DC
Start: 2024-08-23 — End: 2024-08-27
  Administered 2024-08-25 – 2024-08-27 (×5): 5 mg via ORAL

## 2024-08-23 MED ORDER — FLUTICASONE PROPIONATE 50 MCG/ACTUATION NA SPSN
2 | Freq: Every morning | NASAL | 0 refills | Status: DC
Start: 2024-08-23 — End: 2024-08-27
  Administered 2024-08-24: 14:00:00 2 via NASAL

## 2024-08-23 MED ORDER — NYSTATIN 100,000 UNIT/GRAM TP CREA
Freq: Two times a day (BID) | TOPICAL | 0 refills | Status: DC
Start: 2024-08-23 — End: 2024-08-24

## 2024-08-23 MED ORDER — NYSTATIN 100,000 UNIT/GRAM TP POWD
Freq: Two times a day (BID) | TOPICAL | 0 refills | Status: DC
Start: 2024-08-23 — End: 2024-08-24

## 2024-08-23 MED ORDER — METOLAZONE 5 MG PO TAB
5 mg | ORAL | 0 refills | Status: DC | PRN
Start: 2024-08-23 — End: 2024-08-24

## 2024-08-23 MED ORDER — DOCUSATE SODIUM 100 MG PO CAP
100 mg | Freq: Two times a day (BID) | ORAL | 0 refills | Status: DC | PRN
Start: 2024-08-23 — End: 2024-08-27

## 2024-08-23 MED ORDER — SERTRALINE 50 MG PO TAB
50 mg | Freq: Every morning | ORAL | 0 refills | Status: DC
Start: 2024-08-23 — End: 2024-08-27
  Administered 2024-08-24 – 2024-08-27 (×4): 50 mg via ORAL

## 2024-08-23 MED ORDER — MELATONIN 5 MG PO TAB
5 mg | Freq: Every evening | ORAL | 0 refills | Status: DC | PRN
Start: 2024-08-23 — End: 2024-08-27
  Administered 2024-08-24 – 2024-08-26 (×3): 5 mg via ORAL

## 2024-08-23 MED ORDER — ONDANSETRON HCL (PF) 4 MG/2 ML IJ SOLN
4 mg | INTRAVENOUS | 0 refills | Status: DC | PRN
Start: 2024-08-23 — End: 2024-08-27

## 2024-08-23 MED ORDER — POTASSIUM CHLORIDE 20 MEQ PO TBTQ
40 meq | Freq: Once | ORAL | 0 refills | Status: CP
Start: 2024-08-23 — End: ?
  Administered 2024-08-23: 21:00:00 40 meq via ORAL

## 2024-08-23 MED ORDER — TORSEMIDE 20 MG PO TAB
40 mg | Freq: Two times a day (BID) | ORAL | 0 refills | Status: DC
Start: 2024-08-23 — End: 2024-08-25

## 2024-08-23 MED ORDER — LIDOCAINE 5 % TP PTMD
2 | Freq: Every day | TOPICAL | 0 refills | Status: DC
Start: 2024-08-23 — End: 2024-08-27
  Administered 2024-08-24 – 2024-08-27 (×4): 2 via TOPICAL

## 2024-08-23 MED ORDER — POLYETHYLENE GLYCOL 3350 17 GRAM PO PWPK
1 | Freq: Every day | ORAL | 0 refills | Status: DC | PRN
Start: 2024-08-23 — End: 2024-08-27

## 2024-08-23 MED ORDER — SODIUM CHLORIDE 0.9% IV SOLP
INTRAVENOUS | 0 refills | Status: DC
Start: 2024-08-23 — End: 2024-08-23

## 2024-08-23 MED ORDER — LACTATED RINGERS IV BOLUS
250 mL | Freq: Once | INTRAVENOUS | 0 refills | Status: CP
Start: 2024-08-23 — End: ?

## 2024-08-23 MED ORDER — TAMSULOSIN 0.4 MG PO CAP
.4 mg | Freq: Every morning | ORAL | 0 refills | Status: DC
Start: 2024-08-23 — End: 2024-08-27
  Administered 2024-08-24 – 2024-08-27 (×4): 0.4 mg via ORAL

## 2024-08-23 MED ORDER — DIVALPROEX 250 MG PO TBEC
750 mg | Freq: Every evening | ORAL | 0 refills | Status: DC
Start: 2024-08-23 — End: 2024-08-23

## 2024-08-23 MED ORDER — CEFTRIAXONE INJ 2GM IVP
2 g | INTRAVENOUS | 0 refills | Status: DC
Start: 2024-08-23 — End: 2024-08-25
  Administered 2024-08-24 – 2024-08-25 (×2): 2 g via INTRAVENOUS

## 2024-08-23 MED ORDER — LACTATED RINGERS IV SOLP
INTRAVENOUS | 0 refills | Status: AC
Start: 2024-08-23 — End: ?
  Administered 2024-08-23: 20:00:00 1000.0000 mL via INTRAVENOUS

## 2024-08-23 MED ORDER — PANTOPRAZOLE 40 MG IV SOLR
40 mg | Freq: Two times a day (BID) | INTRAVENOUS | 0 refills | Status: DC
Start: 2024-08-23 — End: 2024-08-23

## 2024-08-23 MED ORDER — POLYETHYLENE GLYCOL 3350 17 GRAM PO PWPK
1 | Freq: Every day | ORAL | 0 refills | Status: DC
Start: 2024-08-23 — End: 2024-08-27
  Administered 2024-08-23: 17 g via ORAL

## 2024-08-23 MED ORDER — METOPROLOL SUCCINATE 50 MG PO TB24
50 mg | Freq: Every day | ORAL | 0 refills | Status: DC
Start: 2024-08-23 — End: 2024-08-27
  Administered 2024-08-25 – 2024-08-27 (×3): 50 mg via ORAL

## 2024-08-23 MED ORDER — ONDANSETRON 4 MG PO TBDI
4 mg | ORAL | 0 refills | Status: DC | PRN
Start: 2024-08-23 — End: 2024-08-27

## 2024-08-23 MED ORDER — ERGOCALCIFEROL (VITAMIN D2) 1,250 MCG (50,000 UNIT) PO CAP
50000 [IU] | ORAL | 0 refills | Status: DC
Start: 2024-08-23 — End: 2024-08-27
  Administered 2024-08-27: 14:00:00 50000 [IU] via ORAL

## 2024-08-23 MED ORDER — LEVOTHYROXINE 75 MCG PO TAB
75 ug | Freq: Every day | ORAL | 0 refills | Status: DC
Start: 2024-08-23 — End: 2024-08-27
  Administered 2024-08-24 – 2024-08-27 (×4): 75 ug via ORAL

## 2024-08-23 MED ORDER — SENNOSIDES-DOCUSATE SODIUM 8.6-50 MG PO TAB
1 | Freq: Every day | ORAL | 0 refills | Status: DC | PRN
Start: 2024-08-23 — End: 2024-08-27
  Administered 2024-08-25: 23:00:00 1 via ORAL

## 2024-08-23 MED ORDER — IOHEXOL 350 MG IODINE/ML IV SOLN
100 mL | Freq: Once | INTRAVENOUS | 0 refills | Status: CP
Start: 2024-08-23 — End: ?
  Administered 2024-08-23: 18:00:00 100 mL via INTRAVENOUS

## 2024-08-23 MED ORDER — POTASSIUM CHLORIDE 20 MEQ PO TBTQ
40 meq | Freq: Two times a day (BID) | ORAL | 0 refills | Status: DC
Start: 2024-08-23 — End: 2024-08-27
  Administered 2024-08-27: 16:00:00 40 meq via ORAL

## 2024-08-23 MED ORDER — PANTOPRAZOLE 40 MG IV SOLR
40 mg | Freq: Two times a day (BID) | INTRAVENOUS | 0 refills | Status: DC
Start: 2024-08-23 — End: 2024-08-24
  Administered 2024-08-24 (×2): 40 mg via INTRAVENOUS

## 2024-08-23 MED ORDER — SODIUM CHLORIDE 0.9% IV BOLUS
500 mL | Freq: Once | INTRAVENOUS | 0 refills | Status: CP
Start: 2024-08-23 — End: ?
  Administered 2024-08-23: 18:00:00 500 mL via INTRAVENOUS

## 2024-08-23 MED ORDER — SODIUM CHLORIDE 0.9 % IJ SOLN
50 mL | Freq: Once | INTRAVENOUS | 0 refills | Status: CP
Start: 2024-08-23 — End: ?
  Administered 2024-08-23: 18:00:00 50 mL via INTRAVENOUS

## 2024-08-23 MED ORDER — MAGNESIUM HYDROXIDE 400 MG/5 ML PO SUSP
30 mL | ORAL | 0 refills | Status: DC | PRN
Start: 2024-08-23 — End: 2024-08-27

## 2024-08-23 MED ORDER — PANTOPRAZOLE 40 MG IV SOLR
80 mg | Freq: Once | INTRAVENOUS | 0 refills | Status: CP
Start: 2024-08-23 — End: ?
  Administered 2024-08-23: 18:00:00 80 mg via INTRAVENOUS

## 2024-08-23 MED ORDER — EMPAGLIFLOZIN 10 MG PO TAB
10 mg | Freq: Every day | ORAL | 0 refills | Status: DC
Start: 2024-08-23 — End: 2024-08-27
  Administered 2024-08-25 – 2024-08-27 (×3): 10 mg via ORAL

## 2024-08-23 MED ORDER — ACETAMINOPHEN 500 MG PO TAB
500 mg | ORAL | 0 refills | Status: DC | PRN
Start: 2024-08-23 — End: 2024-08-27
  Administered 2024-08-24 – 2024-08-27 (×8): 500 mg via ORAL

## 2024-08-23 MED ORDER — GABAPENTIN 100 MG PO CAP
200 mg | Freq: Two times a day (BID) | ORAL | 0 refills | Status: DC
Start: 2024-08-23 — End: 2024-08-24
  Administered 2024-08-24 (×2): 200 mg via ORAL

## 2024-08-23 MED ADMIN — LACTATED RINGERS IV SOLP [4318]: 250 mL | INTRAVENOUS | @ 22:00:00 | Stop: 2024-08-23 | NDC 00338011703

## 2024-08-23 NOTE — ED Notes [6]
 Dr. Sanna notified and aware of continuous MAP <65 and blood in urine after foley exchange. Per Moudgal, repeat labs at this time and increase maintaince IVF rate/  Encouraged physician a rapid response would be appropriate. Dr. Elois declined. SEE MAR. Pt A&Ox4 at bedside.

## 2024-08-23 NOTE — Progress Notes [1]
 Chaplain Note:    Admit Date: 08/23/2024     Responded to rapid page.    Patient requested prayer, which was performed. No other spiritual care needs were requested.       Date/Time:                      User:                                      08/23/2024 5:54 PM Redell Pines      PCU 2 PCU    The [facility] is available on Voalte or can be paged via the switchboard [phone]) for urgent and emergent needs.   The Spiritual Care team responds to other requests within 24-hours when submitted as a Chaplain Consult in O2.
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
## 2024-08-23 NOTE — Care Plan [600008]
 Notified by RN at 16:06 that patient has had intermittent MAPs <65.   Rapid response activated at 16:33 due to ongoing low MAPs.     Evaluated patient at bedside with MICU RN team. Patient is alert, oriented, and interactive. Patient denies any symptoms of lightheadedness or dizziness. States his blood pressures have been erratic recently. Believes his blood pressures are usually 100s/60s. RN exchanged foley and noted bloody urine output with some clots.     Vitals - Obtained 2 blood pressures on left arm with MAPs >65 (1st BP 87/54 at 16:46, 2nd BP 86/65 at 16:50). HR 80s. Satting >90% on RA.     Exam -  General Appearance: Appears well nourished, stated age and in no distress  HEENT: Normocephalic and atraumatic  CV: S1 S2 regular rate, normal rhythm. Distant heart sounds.   PULM: Anterior lung fields CTAB  SKIN: Warm and dry to touch.  EXT: +1 bilateral LE edema, sensitive to touch   NEURO: Alert and interactive, tracks to voice.  PSYCH: Normal behavior, goal directed thinking, normal speech (rate and volume)     Labs:    Hgb 14.5 --> 14.2   Platelets 198.   Lactate 1.3 --> 1.0   K 3.3, CO2 35, Cr 3.18 (baseline ~2.3)   UA + Nitrite, 1+ leuk, packed WBC and RBC     Imaging:  CT A/P - no e/o GI bleed    Assessment/Plan:    Hypotension: Etiology multifactorial - could be 2/2 GI bleed, hematuria, or hypovolemia from UTI. Hgb reassuringly stable. Interestingly, patient does not have compensatory tachycardia (on PTA Metoprolol  XL --> currently held which could be contributing). He is mentating well and overall asymptomatic. Lactate WNL. Most recent BP with MAP >65.   Patient safe to transition to inpatient floor bed, defer MICU transfer for now  S/p 250 cc bolus of LR  Increase mIVF to 125 cc/hr x 10 hours --> need to be cautious with IVF iso HFpEF. Can discontinue early if patient shows signs of volume overload  HOLD PTA Metoprolol  XL   Hematuria: likely 2/2 acute urinary retention and traumatic foley placement. Hgb stable. PTA apixaban  held. If does not resolve, consider Urology consult in AM.   Trend CBC   Melena: GI consulted - plan for EGD on 12/3. Trend CBC per above.   Dirty UA: +Nitrite and WBC.   Start CTX 2 g QD (12/2 - )  Urine culture pending     Donzell Shorter, MD  Clinical Assistant Professor  Internal Medicine - Hospitalist     Critical Care Attestation: A total of 31 minutes of critical care time was provided exclusive of time spent on separately billable procedures. Time spent includes physical examination, discussion of patient's care with other medical staff, and review of the following: events over past 24 hours, labs, imaging, fluid balance, hemodynamic data, prior medical records, nutrition status, medications, and microbiologic culture results.
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
## 2024-08-23 NOTE — ED Notes [6]
 Dr. Elois agreeable to rapid response. Rapid response paged.
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
## 2024-08-24 ENCOUNTER — Inpatient Hospital Stay: Admit: 2024-08-24 | Discharge: 2024-08-24 | Payer: MEDICARE

## 2024-08-24 ENCOUNTER — Encounter: Admit: 2024-08-24 | Discharge: 2024-08-24 | Payer: MEDICARE

## 2024-08-24 LAB — CULTURE-URINE W/SENSITIVITY

## 2024-08-24 LAB — HEMOGLOBIN
~~LOC~~ BKR HEMOGLOBIN: 13 g/dL (ref 13.5–16.5)
~~LOC~~ BKR HEMOGLOBIN: 13 g/dL — ABNORMAL LOW (ref 13.5–16.5)

## 2024-08-24 LAB — EGD REPORT

## 2024-08-24 MED ORDER — PROPOFOL 10 MG/ML IV EMUL 20 ML (INFUSION)(AM)(OR)
INTRAVENOUS | 0 refills | Status: DC
Start: 2024-08-24 — End: 2024-08-24

## 2024-08-24 MED ORDER — GABAPENTIN 300 MG PO CAP
300 mg | Freq: Two times a day (BID) | ORAL | 0 refills | Status: DC
Start: 2024-08-24 — End: 2024-08-27
  Administered 2024-08-25 – 2024-08-27 (×6): 300 mg via ORAL

## 2024-08-24 MED ORDER — LIDOCAINE (PF) 20 MG/ML (2 %) IJ SOLN
INTRAVENOUS | 0 refills | Status: DC
Start: 2024-08-24 — End: 2024-08-24

## 2024-08-24 MED ORDER — PANTOPRAZOLE 40 MG PO TBEC
40 mg | Freq: Every day | ORAL | 0 refills | Status: DC
Start: 2024-08-24 — End: 2024-08-27
  Administered 2024-08-25 – 2024-08-27 (×3): 40 mg via ORAL

## 2024-08-24 MED ORDER — PROPOFOL INJ 10 MG/ML IV VIAL
INTRAVENOUS | 0 refills | Status: DC
Start: 2024-08-24 — End: 2024-08-24

## 2024-08-24 MED ORDER — LACTATED RINGERS IV SOLP
INTRAVENOUS | 0 refills | Status: DC
Start: 2024-08-24 — End: 2024-08-24

## 2024-08-24 NOTE — Case Mgmt DC Plan [600024]
 Case Management Admission Assessment    NAME:Jacob Dickerson                          MRN: [id number]             DOB:Nov 16, 1945          AGE: 78 y.o.  ADMISSION DATE: 08/23/2024             DAYS ADMITTED: LOS: 1 day      Today?s Date: 08/24/2024    Per emr, Jacob Dickerson is a 78 y.o. 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     Source of Information: emr and ALF staff since pt was not available today since he was off the unit.     Plan  Plan: Case Management Assessment, Discharge Planning for Home with Post-Acute Care Needs    Spoke to patient and explained role in r/t d/c planning and provided contact information.  Reviewed [facility], Preparing for Discharge, and Preferred Provider Network hand-outs. Provided opportunity for questions and discussion. Encouraged to contact Case Management team with questions and concerns during hospitalization if any assist is needed.      Pt lives at ALF called [facility] ph [phone]. Pt uses Rolator walker for ambulation, but will place lots of items on the seat part of the walker. This lead to a recent fall at the facility in the dining room. Something fell off the seat and pt reached down to pick up item and fell over. This was a few days ago per staff.   Pt is able to complete most of his own ADLs to include bathing and dressing. Pt has meds setup by the staff for him. They also provide meals and then laundry service and housecleaning weekly.   Any new meds pt will need to provide since ALF does not use a specialty pharmacy for the meds. D/c AVS that pt takes with him at d/c will be the only thing the ALF will need for ongoing med setup per discussion with Jacob Dickerson today.   ALF does not provide transportation out of town so medicaid transport will be needed for d/c.   Issue was brought up regarding the foley catheter pt had placed when he went to the [facility] a few days ago for urinary retention. The ALF was having a difficult time managing this with the limited supplies that they have at the ALF and recommending HH for pt if he will need to d/c with this to the ALF. Pt has used [facility] in the past.   Plan:MPL team huddle today. EGD today, monitoring H&H. Will follow up with pt tomorrow regarding d/c needs and possible HH needs.     Patient Address/Phone  [address]  [place] [address]  [phone] (home)     Emergency Contact  Extended Emergency Contact Information  Primary Emergency Contact: [facility] Phone: [phone]  Mobile Phone: [phone]  Relation: Daughter    Healthcare Directive  Healthcare Directive: No, patient does not have a healthcare directive    Transportation  Does the Patient Need Case Management to Arrange Discharge Transport? (ex: facility, ambulance, wheelchair/stretcher, Medicaid, cab, other): Yes  Type of Transport: Wheelchair fleeta  Will the  Patient Use Family Transport?: No    Expected Discharge Date  08/26/2024     Living Situation Prior to Admission  Living Arrangements  Type of Residence: Assisted living facility  Living Arrangements: Alone  Financial Risk Analyst / Tub: Walk-in Shower  How many levels in the residence?: 1  Can patient live on one level if needed?: Yes  Level of Function   Prior level of function: Needs assist with ADLs  Which ADLs require assistance?: homemaking, meals, meds, transportation  Who assists with ADLs?: ALF staff  Cognitive Abilities   Cognitive Abilities: Continue to Assess    Financial Resources  Coverage  Primary Insurance: Medicare Replacement  Secondary Insurance: Medicaid  Medicaid State: [place]   Additional Coverage: None  Medication Coverage    Medication Coverage: Medicaid  Have you experienced a noticeable increase in your copay costs recently?: No  Are current medications affordable?: Yes  Do You Use a Co-Pay Card or a Medication Assistance Program to Help Manage Medication Costs?: No  Do You Manage Your Own Medications?: No  Who is Responsible for Ordering and Setting up Medications?: ALF manages  Source of Income   Source Of Income: Other retirement income, SSI  Financial Assistance Needed?  No needs reported    Psychosocial Needs  Mental Health  Mental Health History: Yes  Mental Health Symptoms: Feeling depressed  Substance Use History  Substance Use History Screen: No  Other  na    Current/Previous Services  PCP  Dickerson, Jacob, [phone], [phone]  Pharmacy    [facility] [place], [place] - [address]  [address]  [place] [address]  Phone: [phone] Fax: [phone]    [id number] [place], [place] - [address]  [address] [place]  [place] [address]  Phone: [phone] Fax: [phone]    [facility] [address], [place] - [address]  [address] [place]  [address]  Phone: [phone] Fax: [phone]    Durable Medical Equipment   Durable Medical Equipment at home: Rollator  Home Health  Receiving home health: In the past  Agency name: [facility]  Would patient use this agency again?: Yes likely, but would have to confirm with pt when available  Hemodialysis or Peritoneal Dialysis  Undergoing hemodialysis or peritoneal dialysis: No  Tube/Enteral Feeds  Receive tube/enteral feeds: No  Infusion  Receive infusions: No  Private Duty  Private duty help used: No  Home and Community Based Services  Home and community based services: No  Ryan White  Ryan White: N/A  Hospice  Hospice: No   Outpatient Therapy  PT: No  OT: No  SLP: No  Skilled Nursing Facility/Nursing Home  SNF: No  NH: No  Inpatient Rehab  IPR: No  Long-Term Acute Care Hospital  LTACH: No  Acute Hospital Stay  Acute Hospital Stay: In the past  Was patient's stay within the last 30 days?: No      Jacob Dickerson Patient Nurse Case Manager [facility]  Ph [phone]  Available on Voalte

## 2024-08-24 NOTE — Anesthesia Postprocedure Evaluation [25]
 Post-Anesthesia Evaluation    Name: Jacob Dickerson      MRN: [id number]     DOB: [birth year]     Age: 78 y.o.     Sex: male   __________________________________________________________________________     Procedure Information       Anesthesia Start Date/Time: 08/24/24 1030    Procedure: ESOPHAGOGASTRODUODENOSCOPY WITH BIOPSY - FLEXIBLE    Location: ENDO 3 / ENDO/GI    Surgeons: Derril Both, MD            Post-Anesthesia Vitals  BP: 113/72 (12/03 1130)  Pulse: 94 (12/03 1130)  Respirations: 18 PER MINUTE (12/03 1130)  SpO2: 94 % (12/03 1130)  O2 Device: (P) Nasal cannula (12/03 1130)   Vitals Value Taken Time   BP 113/72 08/24/24 11:30   Temp 36.3 ?C (97.3 ?F) 08/24/24 11:00   Pulse 94 08/24/24 11:30   Respirations 18 PER MINUTE 08/24/24 11:30   SpO2 94 % 08/24/24 11:30   O2 Device Nasal cannula 08/24/24 11:10   ABP     ART BP           Post Anesthesia Evaluation Note    Evaluation location: Pre/Post  Patient participation: recovered; patient participated in evaluation  Level of consciousness: sleepy but conscious    Pain score: 0  Pain management: adequate    Hydration: normovolemia  Temperature: 36.0?C - 38.4?C  Airway patency: adequate    Perioperative Events       Post-op nausea and vomiting: no PONV    Postoperative Status  Cardiovascular status: hemodynamically stable  Respiratory status: spontaneous ventilation  Follow-up needed: none        Perioperative Events  There were no known complications for this encounter.

## 2024-08-24 NOTE — Progress Notes [1]
 General Progress Note    Name: Jacob Dickerson        MRN: [id number]          DOB: 04-02-1946            Age: 78 y.o.  Admission Date: 08/23/2024       LOS: 1 day    Date of Service: 08/24/2024    Assessment/Plan:      Jacob Dickerson is a 78 y.o. 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      Erosive gastropathy  Melena, UGIB  -melena started Saturday, 11/29. On eliquis  pta for a fib  -hgb stable on admit at 14.5; BUN elevated to 104  -CTA a/p 12/2: no evidence of active GIB. No specific findings to explain abdominal tenderness.   -s/p IV pantoprazole  80 mg x1 in ED; type and crossed   Plan  -cont IV PPI 40 mg BID  -GI consulted for consideration for EGD. Will keep NPO pending formal GI recs  -check iron  studies  -hold pta eliquis  for now   - EGD on 12/2 with erosive gastropathy, no active bleeding  - continue PPI daily for 3 months  - ADAT    Hypotension, resolving  -holding antihypertensives for now, resume as able     AKI on CKD Stage III a  Lactic acidosis, mild -resolved  -follows with [facility]  -bl Cr ~2.3; Cr on admit 3.18 with elevated BUN to 104; LA 2.3 --> 1.3 s/p 500 cc IVF in ED  -suspect pre-renal AKI in setting of GIB w/ ongoing diuretic use +/- obstructive  in setting of acute urinary retention   Plan  -cont foley placement for now  -check UA, urine Na, urine Cr and urea  -ctm strict I's and O's  -avoid nephrotoxins  -cont gentle mIVF while NPO  -hold pta torsemide  40 mg BID, metolazone  5 mg prn and jardiance  10 mg daily for now  -further workup with renal US  and nephrology consult if Cr continues to worsen 12/3     BPH  Acute urinary retention  Pyuria  -required foley placement in ED  -cont pta tamsulosin    -cont foley placement for now  -empiric IV CTX started on admission, follow urine culture  -voiding trial prior to d/c     Hypokalemia  -K 3.3, give K-dur 40 mEq x1  -hold pta K-dur 40 mEq BID for now given holding pta diuretics  -ctm daily metabolic panel and replace as needed     Chronic HFimpEF  -cont pta Toprol  XL 50 mg daily with hold parameters  -hold pta jardiance , torsemide  and metolazone  for now with GIB and suspected pre-renal AKI  -ctm volume status closely   -ctm daily weights, Is and Os     A fib  -cont rate control with metoprolol   -holding pta eliquis  with GIB     HLD  -cont pta atorvastatin  10 mg daily and zetia  10 mg daily      Dep/anx  -cont pta sertraline  50 mg daily      Gout  -renally dose reduce pta allopurinol  given AKI       Diet: Regular  VTE ppx: SCDs; hold eliquis  with GIB  LDA: foley    Code status: DNAR-FI, discussed on admit  Disposition/discharge planning: admit to inpatient        [facility] Medicine    Voalte is the preferred method of communication for Internal Medicine. Please search voalte directory for Med Private First Call assigned team name to find the correct covering doctor or nurse practitioner 24/7. You can find the assigned team in the information bar to the left in the patient chart under service.  Personal Voaltes and pagers are not answered at all hours.    Total Time Today was 55 minutes in the following activities: Preparing to see the patient, Obtaining and/or reviewing separately obtained history, Performing a medically appropriate examination and/or evaluation, Counseling and educating the patient/family/caregiver, Ordering medications, tests, or procedures, Referring and communication with other health care professionals (when not separately reported), Documenting clinical information in the electronic or other health record, and Care coordination (not separately reported)    Assessment & Plan  AKI (acute kidney injury)    Primary hypertension    Hyperlipidemia    Atrial fibrillation (CMS-HCC)    Coronary artery disease involving native coronary artery of native heart without angina pectoris    Acute kidney injury    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, adult (CMS-HCC)    BPH (benign prostatic hyperplasia)    Gout    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)    Chronic systolic heart failure (CMS-HCC)    Lower urinary tract symptoms (LUTS)    Hypotension due to hypovolemia       Present on Admission:   Lower urinary tract symptoms (LUTS)   Chronic systolic heart failure (CMS-HCC)   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)   Gout   BPH (benign prostatic hyperplasia)   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, adult (CMS-HCC)   Acute kidney injury   Atrial fibrillation (CMS-HCC)   Coronary artery disease involving native coronary artery of native heart without angina pectoris   Primary hypertension   Hyperlipidemia   AKI (acute kidney injury)     _______________________________________________________________________    Subjective     Seen and evaluated after his EGD.  The EGD revealed erosive gastropathy but no signs of active or recent bleeding.  His H&H is stable.  He did have a rapid response last night for hypotension.  His blood pressure has improved.  His antihypertensives are on hold.  He is feeling thirsty.  Will advance his diet as able.  Continuing to hold his Eliquis .            Malnutrition Details:                                                      Active Wounds                            Wounds Blister (Not pressure) Right;Lower;Lateral;Anterior;Posterior;Medial Leg (Active)   08/24/24 0000   Wound Type: Blister (Not pressure)   Orientation: Right;Lower;Lateral;Anterior;Posterior;Medial   Location: Leg   Wound Location Comments:    Initial Wound Site Closure:    Initial Dressing Placed:    Initial Cycle:    Initial Suction Setting (mmHg):    Pressure Injury Stages:    Pressure Injury Present Within 24 Hours of Hospital Admission:    If  This Pressure Injury Is Suspected to Be Device Related, Please Select the Device::    Is the Wound Open or Closed:    Wound Assessment Moist;Red 08/24/24 0445   Peri-wound Assessment Dry;Flaky;Pink 08/24/24 0445   Wound Drainage Amount Scant 08/24/24 0445   Wound Drainage Description Serosanguineous 08/24/24 0445   Wound Dressing Status Intact 08/24/24 0445   Wound Care Dressing changed or new application 08/24/24 0000   Wound Dressing and/or Treatment Foam 08/24/24 0445   Dressing Change Due 08/31/24 08/24/24 0445   Number of days: 0       Wounds Blister (Not pressure) Left;Lower;Lateral;Medial;Anterior;Posterior Leg (Active)   08/24/24 0000   Wound Type: Blister (Not pressure)   Orientation: Left;Lower;Lateral;Medial;Anterior;Posterior   Location: Leg   Wound Location Comments:    Initial Wound Site Closure:    Initial Dressing Placed:    Initial Cycle:    Initial Suction Setting (mmHg):    Pressure Injury Stages:    Pressure Injury Present Within 24 Hours of Hospital Admission:    If This Pressure Injury Is Suspected to Be Device Related, Please Select the Device::    Is the Wound Open or Closed:    Wound Assessment Dry;Eschar;Pink 08/24/24 0445   Peri-wound Assessment Bruised;Dry;Flaky;Pink 08/24/24 0445   Wound Drainage Amount None 08/24/24 0445   Wound Dressing Status None/open to air 08/24/24 0445   Number of days: 0                    Medications  Scheduled Meds:allopurinoL  (ZYLOPRIM ) tablet 100 mg, 100 mg, Oral, QDAY  [Held by Provider] apixaban  (ELIQUIS ) tablet 5 mg, 5 mg, Oral, BID  atorvastatin  (LIPITOR) tablet 10 mg, 10 mg, Oral, QDAY  cefTRIAXone  (ROCEPHIN ) IVP 2 g, 2 g, Intravenous, Q24H*  divalproex  (DEPAKOTE  ER) ER tablet 500 mg, 500 mg, Oral, QHS  [Held by Provider] empagliflozin  (JARDIANCE ) tablet 10 mg, 10 mg, Oral, QDAY  [START ON 08/27/2024] ERGOcalciferoL  (vitamin D2) (DRISDOL ) capsule 50,000 Units, 50,000 Units, Oral, Q7 Days  ezetimibe  (ZETIA ) tablet 10 mg, 10 mg, Oral, QDAY  fluticasone  propionate (FLONASE ) nasal spray 2 spray, 2 spray, Each Nostril, QAM8  gabapentin  (NEURONTIN ) capsule 200 mg, 200 mg, Oral, BID  levothyroxine  (SYNTHROID ) tablet 75 mcg, 75 mcg, Oral, QDAY 30 min before breakfast  lidocaine  (LIDODERM ) 5 % topical patch 2 patch, 2 patch, Topical, QDAY  [Held by Provider] metoprolol  succinate XL (TOPROL  XL) tablet 50 mg, 50 mg, Oral, QDAY  pantoprazole  (PROTONIX ) injection 40 mg, 40 mg, Intravenous, API(88-78)  polyethylene glycol 3350  (MIRALAX ) packet 17 g, 1 packet, Oral, QDAY  [Held by Provider] potassium chloride  SR (K-DUR) tablet 40 mEq, 40 mEq, Oral, BID  sertraline  (ZOLOFT ) tablet 50 mg, 50 mg, Oral, QAM8  tamsulosin  (FLOMAX ) capsule 0.4 mg, 0.4 mg, Oral, QAM8  [Held by Provider] torsemide  (DEMADEX ) tablet 40 mg, 40 mg, Oral, BID    Continuous Infusions:  PRN and Respiratory Meds:acetaminophen  Q4H PRN, docusate BID PRN, melatonin QHS PRN, [Held by Provider] metOLazone  PRN, milk of magnesium  Q24H PRN, [Held by Provider] ondansetron  Q6H PRN **OR** [Held by Provider] ondansetron  Q6H PRN, polyethylene glycol 3350  QDAY PRN, sennosides-docusate sodium  QDAY PRN                Objective                        Vital Signs: Last Filed  Vital Signs: 24 Hour Range   BP: 96/56 (12/03 0835)  Temp: 36.6 ?C (97.8 ?F) (12/03 9164)  Pulse: 93 (12/03 0835)  Respirations: 17 PER MINUTE (12/03 0835)  SpO2: 94 % (12/03 0835)  O2 Device: Nasal cannula (12/03 0835)  O2 Liter Flow: 2 Lpm (12/03 0835) BP: (69-139)/(39-124)   Temp:  [36.3 ?C (97.4 ?F)-36.6 ?C (97.8 ?F)]   Pulse:  [69-103]   Respirations:  [13 PER MINUTE-25 PER MINUTE]   SpO2:  [84 %-94 %]   O2 Device: Nasal cannula  O2 Liter Flow: 2 Lpm   Intensity Pain Scale (Self Report): 5 (08/23/24 1027) Vitals:    08/23/24 1023 08/24/24 0831   Weight: 134.1 kg (295 lb 11.2 oz) 123.4 kg (272 lb 0.8 oz)       Intake/Output Summary:  (Last 24 hours)    Intake/Output Summary (Last 24 hours) at 08/24/2024 0912  Last data filed at 08/24/2024 0445  Gross per 24 hour   Intake 1850 ml   Output 1826 ml   Net 24 ml     Stool Occurrence: 1        Physical Exam  GEN: Awake, alert, NAD  HEENT: EOMI, No scleral icterus,   CV: S1S2 RRR, no murmurs  RESP: No respiratory distress, clear to auscultation, no wheezes or rhonchi  ABD: Soft, mild diffuse TTP, no distention or guarding  EXT: No LE edema    Lab Review  24-hour labs:    Results for orders placed or performed during the hospital encounter of 08/23/24 (from the past 24 hours)   CBC AND DIFF    Collection Time: 08/23/24 11:05 AM   Result Value Ref Range    White Blood Cells 9.00 4.50 - 11.00 10*3/uL    Red Blood Cells 4.82 4.40 - 5.50 10*6/uL    Hemoglobin 14.5 13.5 - 16.5 g/dL    Hematocrit 54.7 59.9 - 50.0 %    MCV 93.8 80.0 - 100.0 fL    MCH 30.2 26.0 - 34.0 pg    MCHC 32.2 32.0 - 36.0 g/dL    RDW 83.2 (H) 88.9 - 15.0 %    Platelet Count 210 150 - 400 10*3/uL    MPV 9.2 7.0 - 11.0 fL    Neutrophils 71.7 41.0 - 77.0 %    Lymphocytes 13.6 (L) 24.0 - 44.0 %    Monocytes 11.1 4.0 - 12.0 %    Eosinophils 3.0 0.0 - 5.0 %    Basophils 0.6 0.0 - 2.0 %    Absolute Neutrophil Count 6.50 1.80 - 7.00 10*3/uL    Absolute Lymph Count 1.20 1.00 - 4.80 10*3/uL    Absolute Monocyte Count 1.00 (H) 0.00 - 0.80 10*3/uL    Absolute Eosinophil Count 0.30 0.00 - 0.45 10*3/uL    Absolute Basophil Count 0.10 0.00 - 0.20 10*3/uL    MDW (Monocyte Distribution Width) 20.2 <=20.6   COMPREHENSIVE METABOLIC PANEL    Collection Time: 08/23/24 11:05 AM   Result Value Ref Range    Sodium 139 137 - 147 mmol/L    Potassium 3.3 (L) 3.5 - 5.1 mmol/L    Chloride 91 (L) 98 - 110 mmol/L    Glucose 169 (H) 70 - 100 mg/dL    Blood Urea Nitrogen 104 (H) 7 - 25 mg/dL    Creatinine 6.81 (H) 0.40 - 1.24 mg/dL    Calcium  8.9 8.5 - 10.6 mg/dL    Total Protein 7.7 6.0 - 8.0 g/dL    Total Bilirubin  0.8 0.2 - 1.3 mg/dL    Albumin 3.9 3.5 - 5.0 g/dL    Alk Phosphatase 858 (H) 25 - 110 U/L    AST 43 (H) 7 - 40 U/L    ALT 22 7 - 56 U/L    CO2 35 (H) 21 - 30 mmol/L Anion Gap 13 (H) 3 - 12    Glomerular Filtration Rate (GFR) 19 (L) >60 mL/min   TYPE & CROSSMATCH    Collection Time: 08/23/24 11:05 AM   Result Value Ref Range    ABO/RH(D) O POS     Antibody Screen NEG     Crossmatch Expires 08/26/2024,2359     Units Ordered 0     Electronic Crossmatch YES     Record Check FOUND    NT-PRO-BNP    Collection Time: 08/23/24 11:05 AM   Result Value Ref Range    NT-Pro-BNP 1,898 (H) <450 pg/mL   LIPASE    Collection Time: 08/23/24 11:05 AM   Result Value Ref Range    Lipase 40 11 - 82 U/L   MAGNESIUM     Collection Time: 08/23/24 11:05 AM   Result Value Ref Range    Magnesium  2.5 1.6 - 2.6 mg/dL   PHOSPHORUS    Collection Time: 08/23/24 11:05 AM   Result Value Ref Range    Phosphorus 4.1 2.0 - 4.5 mg/dL   PTT (APTT)    Collection Time: 08/23/24 11:05 AM   Result Value Ref Range    APTT 33.1 24.0 - 36.5 Seconds   HIGH SENSITIVITY TROPONIN I 0 HOUR    Collection Time: 08/23/24 11:05 AM   Result Value Ref Range    hs Troponin I 0 Hour 42.9 (H) <20.0 ng/L   PROTIME INR (PT)    Collection Time: 08/23/24 11:05 AM   Result Value Ref Range    Protime 20.2 (H) 9.9 - 14.2 Seconds    INR 1.8 (H) 0.9 - 1.2   IRON  + BINDING CAPACITY + %SAT+ FERRITIN    Collection Time: 08/23/24 11:05 AM   Result Value Ref Range    Iron  57 50 - 185 ?g/dL    Iron  Binding-TIBC 381 (H) 270 - 380 mcg/dL    % Saturation 15 (L) 28 - 42 %    Transferrin 256 185 - 336 mg/dL    Ferritin 682 (H) 30 - 300 ng/mL   POC CREATININE    Collection Time: 08/23/24 11:07 AM   Result Value Ref Range    Creatinine, POC 3.2 (H) 0.4 - 1.24 mg/dL   POC LACTATE    Collection Time: 08/23/24 11:07 AM   Result Value Ref Range    LACTIC ACID POC 2.3 (H) 0.5 - 2.0 mmol/L   POC LACTATE    Collection Time: 08/23/24 12:26 PM   Result Value Ref Range    LACTIC ACID POC 1.3 0.5 - 2.0 mmol/L   HIGH SENSITIVITY TROPONIN I 2 HOUR    Collection Time: 08/23/24  2:04 PM   Result Value Ref Range    hs Troponin I 2 Hour 37.3 (H) <20.0 ng/L    hs Troponin I 2-0hr Delta Value -5.6    URINALYSIS DIPSTICK REFLEX TO CULTURE    Collection Time: 08/23/24  3:08 PM    Specimen: Midstream; Urine   Result Value Ref Range    Color,UA Red     Turbidity,UA Clear Clear    Specific Gravity-Urine 1.029 1.005 - 1.030    pH,UA 6.0 5.0 - 8.0  Protein,UA 3+ (A) Negative    Glucose,UA Negative Negative    Ketones,UA Trace (A) Negative    Bilirubin,UA Negative Negative    Blood,UA 2+ (A) Negative    Urobilinogen,UA Normal Normal    Nitrite,UA Positive (A) Negative    Leukocytes,UA 1+ (A) Negative   URINALYSIS MICROSCOPIC REFLEX TO CULTURE    Collection Time: 08/23/24  3:08 PM    Specimen: Midstream; Urine   Result Value Ref Range    WBCs,UA Packed (A) None, 0 - 2  /HPF    RBCs,UA Packed (A) None, 0 - 2  /HPF    Bacteria,UA Few (A) None /HPF    WBC Clumps Present (A) None /HPF   SODIUM-URINE RANDOM    Collection Time: 08/23/24  3:08 PM   Result Value Ref Range    Urine Sodium 39 mmol/L   CREATININE-URINE RANDOM    Collection Time: 08/23/24  3:08 PM   Result Value Ref Range    Urine Creatinine 80 mg/dL   UREA NITROGEN-URINE RANDOM    Collection Time: 08/23/24  3:08 PM   Result Value Ref Range    Urine Urea Nitrogen 722 mg/dL   HIGH SENSITIVITY TROPONIN I 4 HR    Collection Time: 08/23/24  4:16 PM   Result Value Ref Range    hs Troponin I 4 Hour 44.6 (H) <20.0 ng/L    hs Troponin I 4-2hr Delta Value 7.3    BASIC METABOLIC PANEL    Collection Time: 08/23/24  4:16 PM   Result Value Ref Range    Sodium 140 137 - 147 mmol/L    Potassium 3.5 3.5 - 5.1 mmol/L    Chloride 94 (L) 98 - 110 mmol/L    Glucose 101 (H) 70 - 100 mg/dL    Blood Urea Nitrogen 95 (H) 7 - 25 mg/dL    Creatinine 7.07 (H) 0.40 - 1.24 mg/dL    Calcium  8.7 8.5 - 10.6 mg/dL    CO2 34 (H) 21 - 30 mmol/L    Anion Gap 12 3 - 12    Glomerular Filtration Rate (GFR) 21 (L) >60 mL/min   CBC AND DIFF    Collection Time: 08/23/24  4:16 PM   Result Value Ref Range    White Blood Cells 8.90 4.50 - 11.00 10*3/uL    Red Blood Cells 4.70 4.40 - 5.50 10*6/uL    Hemoglobin 14.2 13.5 - 16.5 g/dL    Hematocrit 56.2 59.9 - 50.0 %    MCV 93.0 80.0 - 100.0 fL    MCH 30.1 26.0 - 34.0 pg    MCHC 32.4 32.0 - 36.0 g/dL    RDW 83.4 (H) 88.9 - 15.0 %    Platelet Count 198 150 - 400 10*3/uL    MPV 9.0 7.0 - 11.0 fL    Neutrophils 71.0 41.0 - 77.0 %    Lymphocytes 15.4 (L) 24.0 - 44.0 %    Monocytes 9.8 4.0 - 12.0 %    Eosinophils 3.1 0.0 - 5.0 %    Basophils 0.7 0.0 - 2.0 %    Absolute Neutrophil Count 6.40 1.80 - 7.00 10*3/uL    Absolute Lymph Count 1.40 1.00 - 4.80 10*3/uL    Absolute Monocyte Count 0.90 (H) 0.00 - 0.80 10*3/uL    Absolute Eosinophil Count 0.30 0.00 - 0.45 10*3/uL    Absolute Basophil Count 0.10 0.00 - 0.20 10*3/uL    MDW (Monocyte Distribution Width) 20.4 <=20.6   POC LACTATE    Collection Time:  08/23/24  4:19 PM   Result Value Ref Range    LACTIC ACID POC 1.0 0.5 - 2.0 mmol/L   HEMOGLOBIN    Collection Time: 08/24/24 12:30 AM   Result Value Ref Range    Hemoglobin 13.6 13.5 - 16.5 g/dL   CBC    Collection Time: 08/24/24  4:28 AM   Result Value Ref Range    White Blood Cells 8.90 4.50 - 11.00 10*3/uL    Red Blood Cells 4.56 4.40 - 5.50 10*6/uL    Hemoglobin 13.5 13.5 - 16.5 g/dL    Hematocrit 57.5 59.9 - 50.0 %    MCV 92.9 80.0 - 100.0 fL    MCH 29.6 26.0 - 34.0 pg    MCHC 31.8 (L) 32.0 - 36.0 g/dL    RDW 83.2 (H) 88.9 - 15.0 %    Platelet Count 202 150 - 400 10*3/uL    MPV 9.2 7.0 - 11.0 fL   BASIC METABOLIC PANEL    Collection Time: 08/24/24  4:28 AM   Result Value Ref Range    Sodium 142 137 - 147 mmol/L    Potassium 4.4 3.5 - 5.1 mmol/L    Chloride 99 98 - 110 mmol/L    Glucose 121 (H) 70 - 100 mg/dL    Blood Urea Nitrogen 88 (H) 7 - 25 mg/dL    Creatinine 7.32 (H) 0.40 - 1.24 mg/dL    Calcium  8.6 8.5 - 10.6 mg/dL    CO2 32 (H) 21 - 30 mmol/L    Anion Gap 11 3 - 12    Glomerular Filtration Rate (GFR) 24 (L) >60 mL/min       Point of Care Testing  (Last 24 hours)  Glucose: (!) 121 (08/24/24 0428)    Radiology and other Diagnostics Review:    Last Images past 24 hours:   Radiology  (Last 24 hours)                 08/23/24 1131  CTA ABDOMEN/PELVIS Final result    Impression:          1.  No evidence of active gastrointestinal bleed.   2.  No specific findings to explain the patient's abdominal tenderness.   3.  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       By my electronic signature, I attest that I have personally reviewed the images for this examination and formulated the interpretations and opinions expressed in this report            Finalized by Fonda L. Malinda, M.D. on 08/23/2024 12:03 PM. Dictated by Calla Blackwater, MD on 08/23/2024 11:34 AM.

## 2024-08-24 NOTE — Progress Notes [1]
 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 We are consulted for cardiac clearance prior to EGD.     RCRI score: 3 (CHF, CAD, Creatinine > 2.0); 10% risk of cardiac event.  Jacob Dickerson is at moderate risk for a cardiac event with any procedure given his cardiac history. He denies any chest pain at this time. HS troponin was mildly elevate but downtrending, level similar to prior.  ECG showed atrial fibrillation, but otherwise no concerning findings. Currently, he appears euvolemic and is at his dry weight (295 lbs) . NT-Pro BNP is below his baseline.  Tolerated EGD without difficulty, found to have erosive gastropathy without bleeding.    Recommendations:  > Resume Eliquis  5mg  BID now s/p EGD without evidence of bleeding.  Could consider left atrial appendage occlusion down the road  > Resume metoprolol  succinate 100 mg Daily   > Resume PTA Jardiance   > Continue Atorvastatin  10 mg Daily   > Would restart diuretics at lower than PTA dose, recommend torsemide  20mg  BID starting 12/4, continue this dose at discharge   > Recommend holding Torsemide  for now. Will reassess renal function and volume status tomorrow   > K+ > 4.0, Mg > 2.0  > Continue to monitor on telemetry   > Will arrange close outpatient follow-up     Cardiology service will sign off.    Patient was seen and discussed w/ Dr. Pierpoline.     Jacob Herald, MD  Cardiology Fellow  __________________________________________________________________________________    Subjective: Seen following his EGD.  Currently doing well, eating [employer].  Denies any chest pain or shortness of breath.  No new questions or concerns.      Review of Systems: 10 point ROS negative except for as above.       Allergies:  Hydrochlorothiazide, Niacin, and Pneumococcal vaccine    Medications:  Scheduled Meds:allopurinoL  (ZYLOPRIM ) tablet 100 mg, 100 mg, Oral, QDAY  [Held by Provider] apixaban  (ELIQUIS ) tablet 5 mg, 5 mg, Oral, BID  atorvastatin  (LIPITOR) tablet 10 mg, 10 mg, Oral, QDAY  cefTRIAXone  (ROCEPHIN ) IVP 2 g, 2 g, Intravenous, Q24H*  divalproex  (DEPAKOTE  ER) ER tablet 500 mg, 500 mg, Oral, QHS  [Held by Provider] empagliflozin  (JARDIANCE ) tablet 10 mg, 10 mg, Oral, QDAY  [START ON 08/27/2024] ERGOcalciferoL  (vitamin D2) (DRISDOL ) capsule 50,000 Units, 50,000 Units, Oral, Q7 Days  ezetimibe  (ZETIA ) tablet 10 mg, 10 mg, Oral, QDAY  fluticasone  propionate (FLONASE ) nasal spray 2 spray, 2 spray, Each Nostril, QAM8  gabapentin  (NEURONTIN ) capsule 200 mg, 200 mg, Oral, BID  levothyroxine  (SYNTHROID ) tablet 75 mcg, 75 mcg, Oral, QDAY 30 min before breakfast  lidocaine  (LIDODERM ) 5 % topical patch 2 patch, 2 patch, Topical, QDAY  [Held by Provider] metoprolol  succinate XL (TOPROL  XL) tablet 50 mg, 50 mg, Oral, QDAY  pantoprazole  (PROTONIX ) injection 40 mg, 40 mg, Intravenous, API(88-78)  polyethylene glycol 3350  (MIRALAX ) packet 17 g, 1 packet, Oral, QDAY  [Held by Provider] potassium chloride  SR (K-DUR) tablet 40 mEq, 40 mEq, Oral, BID  sertraline  (ZOLOFT ) tablet 50 mg, 50 mg, Oral, QAM8  tamsulosin  (FLOMAX ) capsule 0.4 mg, 0.4 mg, Oral, QAM8  [Held by Provider] torsemide  (DEMADEX ) tablet 40 mg, 40 mg, Oral, BID    Continuous Infusions:  PRN and Respiratory Meds:acetaminophen  Q4H PRN, docusate BID PRN, melatonin QHS PRN, [Held by Provider] metOLazone  PRN, milk of magnesium  Q24H PRN, [Held by Provider] ondansetron  Q6H PRN **OR** [Held by Provider] ondansetron  Q6H PRN, polyethylene glycol 3350  QDAY PRN, sennosides-docusate sodium  QDAY PRN       Physical Exam:  Vital Signs: Last Filed In 24 Hours Vital Signs: 24 Hour Range   BP: 97/46 (12/03 0415)  Temp: 36.4 ?C (97.5 ?F) (12/03 9584)  Pulse: 69 (12/03 0415)  Respirations: 16 PER MINUTE (12/03 0415)  SpO2: 93 % (12/03 0415)  O2 Device: Nasal cannula (12/03 0415)  O2 Liter Flow: 3 Lpm (12/03 0415) BP: (69-139)/(39-124)   Temp:  [36.3 ?C (97.4 ?F)-36.5 ?C (97.7 ?F)]   Pulse:  [69-103]   Respirations:  [13 PER MINUTE-25 PER MINUTE]   SpO2:  [84 %-94 %]   O2 Device: Nasal cannula  O2 Liter Flow: 3 Lpm   Intensity Pain Scale (Self Report): 5 (08/23/24 1027)    Intake/Outpur Summary: (Last 24 hours)    Intake/Output Summary (Last 24 hours) at 08/24/2024 0820  Last data filed at 08/24/2024 0445  Gross per 24 hour   Intake 1850 ml   Output 1826 ml   Net 24 ml      Vitals:    08/23/24 1023   Weight: 134.1 kg (295 lb 11.2 oz)        Physical Exam   Vitals reviewed.   Constitutional:       General: He is not in acute distress.     Appearance: He is obese.   HENT:      Head: Normocephalic and atraumatic.   Cardiovascular:      Rate and Rhythm: Normal rate. Rhythm irregular.      Pulses: Normal pulses.      Heart sounds: Normal heart sounds.   Pulmonary:      Effort: Pulmonary effort is normal. No respiratory distress.   Abdominal:      General: There is distension.      Palpations: Abdomen is soft.      Tenderness: There is abdominal tenderness.   Musculoskeletal:      Right lower leg: Tenderness present. No edema.      Left lower leg: Tenderness present. No edema.   Skin:     General: Skin is warm and dry.   Neurological:      General: No focal deficit present.      Mental Status: He is alert and oriented to person, place, and time.    Laboratory Review:   24-hour labs:    Results for orders placed or performed during the hospital encounter of 08/23/24 (from the past 24 hours)   CBC AND DIFF    Collection Time: 08/23/24 11:05 AM   Result Value Ref Range    White Blood Cells 9.00 4.50 - 11.00 10*3/uL    Red  Blood Cells 4.82 4.40 - 5.50 10*6/uL    Hemoglobin 14.5 13.5 - 16.5 g/dL    Hematocrit 54.7 59.9 - 50.0 %    MCV 93.8 80.0 - 100.0 fL    MCH 30.2 26.0 - 34.0 pg    MCHC 32.2 32.0 - 36.0 g/dL    RDW 83.2 (H) 88.9 - 15.0 %    Platelet Count 210 150 - 400 10*3/uL    MPV 9.2 7.0 - 11.0 fL    Neutrophils 71.7 41.0 - 77.0 %    Lymphocytes 13.6 (L) 24.0 - 44.0 %    Monocytes 11.1 4.0 - 12.0 %    Eosinophils 3.0 0.0 - 5.0 %    Basophils 0.6 0.0 - 2.0 %    Absolute Neutrophil Count 6.50 1.80 - 7.00 10*3/uL    Absolute Lymph Count 1.20 1.00 - 4.80 10*3/uL    Absolute Monocyte Count 1.00 (H) 0.00 - 0.80 10*3/uL    Absolute Eosinophil Count 0.30 0.00 - 0.45 10*3/uL    Absolute Basophil Count 0.10 0.00 - 0.20 10*3/uL    MDW (Monocyte Distribution Width) 20.2 <=20.6   COMPREHENSIVE METABOLIC PANEL    Collection Time: 08/23/24 11:05 AM   Result Value Ref Range    Sodium 139 137 - 147 mmol/L    Potassium 3.3 (L) 3.5 - 5.1 mmol/L    Chloride 91 (L) 98 - 110 mmol/L    Glucose 169 (H) 70 - 100 mg/dL    Blood Urea Nitrogen 104 (H) 7 - 25 mg/dL    Creatinine 6.81 (H) 0.40 - 1.24 mg/dL    Calcium  8.9 8.5 - 10.6 mg/dL    Total Protein 7.7 6.0 - 8.0 g/dL    Total Bilirubin 0.8 0.2 - 1.3 mg/dL    Albumin 3.9 3.5 - 5.0 g/dL    Alk Phosphatase 858 (H) 25 - 110 U/L    AST 43 (H) 7 - 40 U/L    ALT 22 7 - 56 U/L    CO2 35 (H) 21 - 30 mmol/L    Anion Gap 13 (H) 3 - 12    Glomerular Filtration Rate (GFR) 19 (L) >60 mL/min   TYPE & CROSSMATCH    Collection Time: 08/23/24 11:05 AM   Result Value Ref Range    ABO/RH(D) O POS     Antibody Screen NEG     Crossmatch Expires 08/26/2024,2359     Units Ordered 0     Electronic Crossmatch YES     Record Check FOUND    NT-PRO-BNP    Collection Time: 08/23/24 11:05 AM   Result Value Ref Range    NT-Pro-BNP 1,898 (H) <450 pg/mL   LIPASE Collection Time: 08/23/24 11:05 AM   Result Value Ref Range    Lipase 40 11 - 82 U/L   MAGNESIUM     Collection Time: 08/23/24 11:05 AM   Result Value Ref Range    Magnesium  2.5 1.6 - 2.6 mg/dL   PHOSPHORUS    Collection Time: 08/23/24 11:05 AM   Result Value Ref Range    Phosphorus 4.1 2.0 - 4.5 mg/dL   PTT (APTT)    Collection Time: 08/23/24 11:05 AM   Result Value Ref Range    APTT 33.1 24.0 - 36.5 Seconds   HIGH SENSITIVITY TROPONIN I 0 HOUR    Collection Time: 08/23/24 11:05 AM   Result Value Ref Range    hs Troponin I 0 Hour 42.9 (H) <20.0 ng/L   PROTIME INR (PT)  Collection Time: 08/23/24 11:05 AM   Result Value Ref Range    Protime 20.2 (H) 9.9 - 14.2 Seconds    INR 1.8 (H) 0.9 - 1.2   IRON  + BINDING CAPACITY + %SAT+ FERRITIN    Collection Time: 08/23/24 11:05 AM   Result Value Ref Range    Iron  57 50 - 185 ?g/dL    Iron  Binding-TIBC 381 (H) 270 - 380 mcg/dL    % Saturation 15 (L) 28 - 42 %    Transferrin 256 185 - 336 mg/dL    Ferritin 682 (H) 30 - 300 ng/mL   POC CREATININE    Collection Time: 08/23/24 11:07 AM   Result Value Ref Range    Creatinine, POC 3.2 (H) 0.4 - 1.24 mg/dL   POC LACTATE    Collection Time: 08/23/24 11:07 AM   Result Value Ref Range    LACTIC ACID POC 2.3 (H) 0.5 - 2.0 mmol/L   POC LACTATE    Collection Time: 08/23/24 12:26 PM   Result Value Ref Range    LACTIC ACID POC 1.3 0.5 - 2.0 mmol/L   HIGH SENSITIVITY TROPONIN I 2 HOUR    Collection Time: 08/23/24  2:04 PM   Result Value Ref Range    hs Troponin I 2 Hour 37.3 (H) <20.0 ng/L    hs Troponin I 2-0hr Delta Value -5.6    URINALYSIS DIPSTICK REFLEX TO CULTURE    Collection Time: 08/23/24  3:08 PM    Specimen: Midstream; Urine   Result Value Ref Range    Color,UA Red     Turbidity,UA Clear Clear    Specific Gravity-Urine 1.029 1.005 - 1.030    pH,UA 6.0 5.0 - 8.0    Protein,UA 3+ (A) Negative    Glucose,UA Negative Negative    Ketones,UA Trace (A) Negative    Bilirubin,UA Negative Negative    Blood,UA 2+ (A) Negative Urobilinogen,UA Normal Normal    Nitrite,UA Positive (A) Negative    Leukocytes,UA 1+ (A) Negative   URINALYSIS MICROSCOPIC REFLEX TO CULTURE    Collection Time: 08/23/24  3:08 PM    Specimen: Midstream; Urine   Result Value Ref Range    WBCs,UA Packed (A) None, 0 - 2  /HPF    RBCs,UA Packed (A) None, 0 - 2  /HPF    Bacteria,UA Few (A) None /HPF    WBC Clumps Present (A) None /HPF   SODIUM-URINE RANDOM    Collection Time: 08/23/24  3:08 PM   Result Value Ref Range    Urine Sodium 39 mmol/L   CREATININE-URINE RANDOM    Collection Time: 08/23/24  3:08 PM   Result Value Ref Range    Urine Creatinine 80 mg/dL   UREA NITROGEN-URINE RANDOM    Collection Time: 08/23/24  3:08 PM   Result Value Ref Range    Urine Urea Nitrogen 722 mg/dL   HIGH SENSITIVITY TROPONIN I 4 HR    Collection Time: 08/23/24  4:16 PM   Result Value Ref Range    hs Troponin I 4 Hour 44.6 (H) <20.0 ng/L    hs Troponin I 4-2hr Delta Value 7.3    BASIC METABOLIC PANEL    Collection Time: 08/23/24  4:16 PM   Result Value Ref Range    Sodium 140 137 - 147 mmol/L    Potassium 3.5 3.5 - 5.1 mmol/L    Chloride 94 (L) 98 - 110 mmol/L    Glucose 101 (H) 70 - 100 mg/dL  Blood Urea Nitrogen 95 (H) 7 - 25 mg/dL    Creatinine 7.07 (H) 0.40 - 1.24 mg/dL    Calcium  8.7 8.5 - 10.6 mg/dL    CO2 34 (H) 21 - 30 mmol/L    Anion Gap 12 3 - 12    Glomerular Filtration Rate (GFR) 21 (L) >60 mL/min   CBC AND DIFF    Collection Time: 08/23/24  4:16 PM   Result Value Ref Range    White Blood Cells 8.90 4.50 - 11.00 10*3/uL    Red Blood Cells 4.70 4.40 - 5.50 10*6/uL    Hemoglobin 14.2 13.5 - 16.5 g/dL    Hematocrit 56.2 59.9 - 50.0 %    MCV 93.0 80.0 - 100.0 fL    MCH 30.1 26.0 - 34.0 pg    MCHC 32.4 32.0 - 36.0 g/dL    RDW 83.4 (H) 88.9 - 15.0 %    Platelet Count 198 150 - 400 10*3/uL    MPV 9.0 7.0 - 11.0 fL    Neutrophils 71.0 41.0 - 77.0 %    Lymphocytes 15.4 (L) 24.0 - 44.0 %    Monocytes 9.8 4.0 - 12.0 %    Eosinophils 3.1 0.0 - 5.0 %    Basophils 0.7 0.0 - 2.0 % Absolute Neutrophil Count 6.40 1.80 - 7.00 10*3/uL    Absolute Lymph Count 1.40 1.00 - 4.80 10*3/uL    Absolute Monocyte Count 0.90 (H) 0.00 - 0.80 10*3/uL    Absolute Eosinophil Count 0.30 0.00 - 0.45 10*3/uL    Absolute Basophil Count 0.10 0.00 - 0.20 10*3/uL    MDW (Monocyte Distribution Width) 20.4 <=20.6   POC LACTATE    Collection Time: 08/23/24  4:19 PM   Result Value Ref Range    LACTIC ACID POC 1.0 0.5 - 2.0 mmol/L   HEMOGLOBIN    Collection Time: 08/24/24 12:30 AM   Result Value Ref Range    Hemoglobin 13.6 13.5 - 16.5 g/dL   CBC    Collection Time: 08/24/24  4:28 AM   Result Value Ref Range    White Blood Cells 8.90 4.50 - 11.00 10*3/uL    Red Blood Cells 4.56 4.40 - 5.50 10*6/uL    Hemoglobin 13.5 13.5 - 16.5 g/dL    Hematocrit 57.5 59.9 - 50.0 %    MCV 92.9 80.0 - 100.0 fL    MCH 29.6 26.0 - 34.0 pg    MCHC 31.8 (L) 32.0 - 36.0 g/dL    RDW 83.2 (H) 88.9 - 15.0 %    Platelet Count 202 150 - 400 10*3/uL    MPV 9.2 7.0 - 11.0 fL   BASIC METABOLIC PANEL    Collection Time: 08/24/24  4:28 AM   Result Value Ref Range    Sodium 142 137 - 147 mmol/L    Potassium 4.4 3.5 - 5.1 mmol/L    Chloride 99 98 - 110 mmol/L    Glucose 121 (H) 70 - 100 mg/dL    Blood Urea Nitrogen 88 (H) 7 - 25 mg/dL    Creatinine 7.32 (H) 0.40 - 1.24 mg/dL    Calcium  8.6 8.5 - 10.6 mg/dL    CO2 32 (H) 21 - 30 mmol/L    Anion Gap 11 3 - 12    Glomerular Filtration Rate (GFR) 24 (L) >60 mL/min       Cardiovascular Studies:  Last EKG 08/23/24: Atrial fibrillation with nonspecific intraventricular block      Last ECHO 11/21/23:  Ventricle not well seen. The left ventricular size is normal. Moderate concentric hypertrophy. The left ventricular systolic function is mildly reduced. Beat-to-beat variation with A-fib. The ejection fraction by Simpson's biplane method is 48%. Abnormal septal motion. Left ventricular diastolic dysfunction. Unable to assess left atrial pressure.    Right Ventricle: The right ventricle is mildly dilated. The right ventricular systolic function is normal.    Left Atrium: Severely dilated. Right Atrium: Severely dilated.    There is a 26 mm Edwards SAPIEN bioprosthetic valve present.  The valve is not well-visualized.  Appears well-seated mean gradient of 7 mmHg unchanged compared to prior study dated 12//2023 no regurgitation.    Technically difficult study with poor visualization, interpretation accuracy could be affected.     Prior EF was 40%       Last Coronary Angiogram 07/29/2021:   SELECTIVE CORONARY ANGIOGRAPHY:    Left main coronary artery:  The left main coronary artery arises normally from the left coronary sinus.  The left main artery bifurcates into the left anterior descending artery and left circumflex artery.  The left main coronary artery is free of angiographically significant disease.  Left anterior descending artery:  The left anterior descending artery is a large caliber vessel.  The mid LAD has a focal area of 30% stenosis after the 1st diagonal branch.  This is a type 3 LAD.  It gives rise to a medium caliber diagonal branch which gives rise to a superior inferior branch which is free of angiographically significant disease.  Left circumflex artery:  The left circumflex artery is a large caliber vessel which gives rise to a high 1st obtuse marginal branch followed by two additional obtuse marginal branches.  The left circumflex artery as well as the branches are free of angiographically significant disease.  Right coronary artery:  The right coronary artery is a large caliber dominant vessel which arises normally from the right coronary sinus.  The right coronary artery distally bifurcates into the right posterior descending artery and right posterolateral branch.  The right coronary as well as its branches are free of angiographically significant disease.  FINAL IMPRESSION:  Normal right and left-sided filling pressures.  Normal cardiac output and cardiac index by Fick method.  The gradient across the aortic valve was 31 mmHg, which is suggestive of moderate aortic stenosis.  Mild nonobstructive coronary artery disease as described above.    The left ventricular end-diastolic pressure was 12 mmHg.     Labs: Troponin 42.9 -> 37.3, NT-pro BNP 1898      PTA Cardiac Medications: Apixaban , Metoprolol  Succinate 100 mg Daily, Jardiance  10 mg Daily, Atorvastatin  10 mg Daily, Torsemide  40 mg BID, Metolazone  5 mg PRN for volume overload

## 2024-08-24 NOTE — Procedure - Immed Post [600086]
 EGD/Upper EUS/ERCP/Antegrade Enteroscopy  Post Upper Endoscopy Instructions      -You may have a sore throat after the procedure for 2-3 days.  Try sucrets or lozenges to help ease the pain.  If it continues please contact us.    -If you feel feverish, have a temperature of 101 degrees or higher, persistent nausea and vomiting, abdominal pain or dark stools; please notify your nurse or GI physician.    -You may have abdominal cramping following the procedure this can be relieved by belching or passing air.    -If you have redness or swelling at the IV site, place a warm, wet washcloth over the affected areas for 15 minutes, 3-4 times a day until the redness subsides.  If symptoms continue for 2-3 days, contact your regular physician.    - If you have bleeding from your mouth, over 2 tablespoons and increasing, please notify your physician.  A small amount of bleeding is normal if a biopsy or polyps were taken.  If you are vomiting blood you need to seek immediate medical attention.    - You may resume all your routine medications, if medications need to be held your physician and/or nurse will notify you post procedure.          SPECIFIC INSTRUCTIONS    INPATIENTS:  Ask for help when you get up in your room, as you may still be drowsy from your sedation.      OUTPATIENTS:  A.      Because of sedation and lack of coordination, UNTIL TOMORROW, DO NOT:  1.       Operate any motorized vehicle - this includes driving.  2.       Sign any legal documents or conduct important business matters.  3.       Use any dangerous machinery (chain saw, lawnmower, etc.).  4.       Drink any alcoholic beverages.         Should you have any questions or concerns after your procedure please call [phone] M-F 8am-5:00 pm. After 5:00 pm, holidays or weekends call [phone] and ask for the GI Doctor on call.

## 2024-08-25 MED ORDER — TORSEMIDE 20 MG PO TAB
20 mg | Freq: Two times a day (BID) | ORAL | 0 refills | Status: DC
Start: 2024-08-25 — End: 2024-08-27
  Administered 2024-08-27: 16:00:00 20 mg via ORAL

## 2024-08-25 NOTE — Progress Notes [1]
 OCCUPATIONAL THERAPY  ASSESSMENT NOTE      Name: Jacob Dickerson   MRN: [id number]     DOB: 04-May-1946      Age: 78 y.o.  Admission Date: 08/23/2024     LOS: 2 days     Date of Service: 08/25/2024    Mobility  Patient Turn/Position: Chair  Mobility Level [facility] Highest Level of Mobility (JH-HLM): Walk 25 feet or more (to doorway/hallway)  Distance Walked (feet): 150 ft  Level of Assistance: Stand by assistance  Assistive Device: Walker  Activity Limited By: No limitations    Subjective  Significant Hospital Events: PMH: chronic HFimpEF, HTN, permanent AFib, nonobstructive CAD, severe AR s/p SAVR 2009 & repeat TAVR 2022, CKD stage III, COPD, & gout- admit with 3 days of melena  Mental / Cognitive: Alert;Oriented;Cooperative  Pain: No complaint of pain    Home Living Situation  Lives With: Alone;Has assistance available as needed  Type of Home: Assisted living (independent apt)  Entry Stairs: No stairs  In-Home Stairs: No stairs  Bathroom Setup: Walk in shower  Patient Owned Equipment: Bathing: Shower chair;Cane: Pyramid;Walker: Rollator    Prior Level of Function  Level Of Independence: Independent with ADL and community mobility with device  Required Assist For: All home functioning ADL  History of Falls in Past 3 Months: Yes  Comments: Pt lives at an Assisted Living Facility that provides all homemaking. Pt ambulates to/from dining hall each day (about 44' each way). Independent with ADLs including bathing. Reports fall last week due to blacking out when he turned a corner. Did not hit his knees, but instead braced himself on the wheelchair of a fellow resident nearby. Otherwise, no h/o falls.    ADL's  Where Assessed: Standing at [employer] Assist: Stand By Assist  Grooming Deficits: No Assist Needed  Comment: Stands at sink for oral care and washing face with roller walker and standby assist. Leans on counter for support due to back pain with standing.    ADL Mobility  Bed Mobility: Supine to Sit: Modified independent  Bed Mobility Comments: HOB elevated  Transfer Type: Sit to/from stand  Transfer: Assistance Level: To/from;Bed;Bedside chair;Standby assist  Transfer: Assistive Device: Agricultural Consultant: Type of Assistance: For safety considerations  End of Activity Status: Up in chair  Sitting Balance: Independent  Standing Balance: Standby assist  Gait Distance: 150 feet  Gait: Assistance Level: Standby assist  Gait: Assistive Device: Roller walker  Gait Comments: seated rest break ~5 min after standing ADLs and before hallway distance ambulation    Activity Tolerance  Endurance: 3/5 Tolerates 25-30 Minutes Exercise w/Multiple Rests  Comment: Denies c/o dizziness/SOA throughout session.    Cognition  Overall Cognitive Status: WFL to Adequately Complete Self Care Tasks Safely    ROM  R UE ROM: WFL   R UE ROM Method: Active  L UE ROM: WFL   L UE ROM Method: Active  R LE ROM: WFL  R LE ROM Method: Active  L LE ROM: WFL  L LE ROM Method: Active  Coordination: Adequate to Complete ADLs  Grasp: Bilateral Grasp Functional for Activity    Edema  RUE Edema: No Significant Edema  LUE Edema: No Significant Edema  R Hand Edema: No Significant Edema  L Hand Edema: No Significant Edema  RLE Edema: No significant edema  LLE Edema: No significant edema    Sensory  Overall Sensory: Pt Perceives Pressure in Both UEs in Gross Exam  Strength / Tone  R UE Strength: WFL  L UE Strength: WFL  R LE Strength: WFL  L LE Strength: WFL    Education  Persons Educated: Patient  Teaching Methods: Verbal Instruction  Patient Response: Verbalized and Demo Understanding  Topics: Role of OT, Goals for Therapy  Goal Formulation: With Patient    Assessment  Assessment: Decreased Endurance  Goal Formulation: Patient  Comments: Pt likely functioning at or very near baseline with potentially somewhat decreased endurance due to medical exacerbations and acute stay. Anticipate continued progress with therapy and mobility with nursing and subsequent d/c home with no ongoing therapy needs once medically stable.    AM-PAC 6 Clicks Daily Activity Inpatient  Putting on and taking off regular lower body clothes: A Little  Bathing (Including washing, rinsing, drying): A Little  Toileting, which includes using toilet, bedpan, or urinal: None  Putting on and taking off regular upper body clothing: None  Taking care of personal grooming such as brushing teeth: None  Eating meals: None  Daily Activity Raw Score: 22  Standardized (T-scale) Score: 47.1    Plan  OT Frequency: 3-5x/week  OT Plan for Next Visit: LE dressing vs toileting transfers + toileting as able    ADL Goals  Patient Will Perform Grooming: Standing at Sink;w/ Mod Independent  Patient Will Perform LE Dressing: w/ Modified Independent  Patient Will Perform Toileting: w/ Modified Independent    Functional Transfer Goals  Pt Will Perform All Functional Transfers: Modified Independent    OT Discharge Recommendations  Recommendation: Home/prior living situation  Patient Currently Requires Physical Assist With: All home functioning ADLs  Patient Currently Requires Equipment: Owns what is needed    Therapist: Almarie Notice, OTR/L [id number]  Date: 08/25/2024

## 2024-08-25 NOTE — Progress Notes [1]
 Heart Failure Nursing Progress Note    Admission Date: 08/23/2024  LOS: 2 days    Admission Weight: 134.1 kg (295 lb 11.2 oz)        Most recent weights (inpatient):   Vitals:    08/24/24 0815 08/24/24 0831 08/25/24 0415   Weight: 123.4 kg (272 lb 0.8 oz) 123.4 kg (272 lb 0.8 oz) 133 kg (293 lb 3.2 oz)     Weight change from previous day:+9.6kg    Fluid restriction ordered: n/a    Intake/Output Summary: (Last 24 hours)    Intake/Output Summary (Last 24 hours) at 08/25/2024 0559  Last data filed at 08/25/2024 0400  Gross per 24 hour   Intake 2217 ml   Output 1750 ml   Net 467 ml       Is patient incontinent No    Heart Failure Education provided: Yes     - Heart Failure Zone Sheet and daily weight discussed  - Low sodium diet discussed    Anticipated discharge date: TBD  Discharge goals: Go home      Daily Assessment of Patient Stated Goals:    Short Term Goal Identified by patient (Short Term=during hospitalization):  Increase mobility.

## 2024-08-25 NOTE — Progress Notes [1]
 PHYSICAL THERAPY  NOTE      Name: Jacob Dickerson   MRN: [id number]     DOB: October 21, 1945      Age: 78 y.o.  Admission Date: 08/23/2024     LOS: 2 days     Date of Service: 08/25/2024      Based on discussion with OT, patient's current level of function suggests that patient will progress with one discipline. PT will sign off at this time. Please re-consult if a change in functional status occurs.     Therapist: Gordy Blunt, PT  Date: 08/25/2024

## 2024-08-25 NOTE — Progress Notes [1]
 General Progress Note    Name: Jacob Dickerson        MRN: [id number]          DOB: [birth year]            Age: 78 y.o.  Admission Date: 08/23/2024       LOS: 2 days    Date of Service: 08/25/2024    Assessment/Plan:      Jacob Dickerson is a 78 y.o. 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 EGD with erosive gastropathy but no active bleeding. PPI to continue. Resume eliquis  and cardiac meds on 12/4 and monitor.     Erosive gastropathy  Melena, UGIB  -melena started Saturday, 11/29. On eliquis  pta for a fib  -hgb stable on admit at 14.5; BUN elevated to 104  -CTA a/p 12/2: no evidence of active GIB. No specific findings to explain abdominal tenderness.   -s/p IV pantoprazole  80 mg x1 in ED; type and crossed   -cont IV PPI 40 mg BID  -GI consulted for consideration for EGD. Will keep NPO pending formal GI recs  -check iron  studies  -EGD on 12/2 with erosive gastropathy, no active bleeding  -continue PPI daily for 3 months  -ADAT  -resume eliquis  on 12/4 and monitor    Chronic HFimpEF  -cont pta Toprol  XL 50 mg daily with hold parameters  -hold pta jardiance , torsemide  and metolazone  for now with GIB and suspected pre-renal AKI  -ctm volume status closely   -ctm daily weights, Is/Os  -resume toprol  XL and jardiance  on 12/4  -continue to hold torsemide  for now    Hypotension, resolving  -holding antihypertensives for now, resume as able  -slow resume cardiac meds     AKI on CKD Stage III a  Lactic acidosis, mild -resolved  -follows with [facility]  -bl Cr ~2.3; Cr on admit 3.18 with elevated BUN to 104; LA 2.3 --> 1.3 s/p 500 cc IVF in ED  -suspect pre-renal AKI in setting of GIB w/ ongoing diuretic use +/- obstructive  in setting of acute urinary retention   Plan  -cont foley placement for now  -check UA, urine Na, urine Cr and urea  -ctm strict I's and O's  -avoid nephrotoxins  -cont gentle mIVF while NPO  -hold pta torsemide  40 mg BID, metolazone  5 mg prn and jardiance  10 mg daily for now  -further workup with renal US  and nephrology consult if Cr continues to worsen 12/3     BPH  Acute urinary retention  Pyuria  -required foley placement in ED  -cont pta tamsulosin    -cont foley placement for now  -empiric IV CTX started on admission, follow urine culture  - urine culture negative, stop abx  - voiding trial on 12/4     Hypokalemia  -K 3.3, give K-dur 40 mEq x1  -hold pta K-dur 40 mEq BID for now given holding pta diuretics  -ctm daily metabolic panel and replace as needed     A fib  -cont rate control with metoprolol   -holding pta eliquis  with GIB, resume on 12/4 and monitor     HLD  -cont pta atorvastatin  10 mg daily and zetia  10 mg daily      Dep/anx  -cont pta sertraline  50 mg daily  Gout  -renally dose reduce pta allopurinol  given AKI       Diet: Regular  VTE ppx: SCDs; hold eliquis  with GIB  LDA: foley    Code status: DNAR-FI, discussed on admit  Disposition/discharge planning: admit to inpatient        [facility] Medicine    Voalte is the preferred method of communication for Internal Medicine. Please search voalte directory for Med [facility] First Call assigned team name to find the correct covering doctor or nurse practitioner 24/7. You can find the assigned team in the information bar to the left in the patient chart under service.  Personal Voaltes and pagers are not answered at all hours.    Total Time Today was 55 minutes in the following activities: Preparing to see the patient, Obtaining and/or reviewing separately obtained history, Performing a medically appropriate examination and/or evaluation, Counseling and educating the patient/family/caregiver, Ordering medications, tests, or procedures, Referring and communication with other health care professionals (when not separately reported), Documenting clinical information in the electronic or other health record, and Care coordination (not separately reported)      Assessment & Plan  AKI (acute kidney injury)    Primary hypertension    Hyperlipidemia    Atrial fibrillation (CMS-HCC)    Coronary artery disease involving native coronary artery of native heart without angina pectoris    Acute kidney injury    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, adult (CMS-HCC)    BPH (benign prostatic hyperplasia)    Gout    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)    Chronic systolic heart failure (CMS-HCC)    Lower urinary tract symptoms (LUTS)    Hypotension due to hypovolemia       Present on Admission:   Lower urinary tract symptoms (LUTS)   Chronic systolic heart failure (CMS-HCC)   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)   Gout   BPH (benign prostatic hyperplasia)   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, adult (CMS-HCC)   Acute kidney injury   Atrial fibrillation (CMS-HCC)   Coronary artery disease involving native coronary artery of native heart without angina pectoris   Primary hypertension   Hyperlipidemia   AKI (acute kidney injury)     _______________________________________________________________________    Subjective     Doing well today. Tolerating diet. BP improving. Per cardiology will resume eliquis  and his cardiac medications. Will monitor today. PT pending.             Malnutrition Details:                                                      Active Wounds                            Wounds Blister (Not pressure) Right;Lower;Lateral;Anterior;Posterior;Medial Leg (Active)   08/24/24 0000   Wound Type: Blister (Not pressure)   Orientation: Right;Lower;Lateral;Anterior;Posterior;Medial   Location: Leg   Wound Location Comments:    Initial Wound Site Closure:    Initial Dressing Placed:    Initial Cycle:    Initial Suction Setting (mmHg):    Pressure Injury Stages:    Pressure Injury Present Within 24 Hours of Hospital Admission:    If This Pressure Injury Is  Suspected to Be Device Related, Please Select the Device::    Is the Wound Open or Closed:    Wound Assessment Dressing not removed for assessment 08/24/24 2020   Peri-wound Assessment Intact 08/24/24 0900   Wound Drainage Amount Scant 08/24/24 0445   Wound Drainage Description Serosanguineous 08/24/24 0445   Wound Dressing Status Intact 08/24/24 2020   Wound Care Dressing changed or new application 08/24/24 0000   Wound Dressing and/or Treatment Foam 08/24/24 2020   Dressing Change Due 08/31/24 08/24/24 2020   Number of days: 1       Wounds Blister (Not pressure) Left;Lower;Lateral;Medial;Anterior;Posterior Leg (Active)   08/24/24 0000   Wound Type: Blister (Not pressure)   Orientation: Left;Lower;Lateral;Medial;Anterior;Posterior   Location: Leg   Wound Location Comments:    Initial Wound Site Closure:    Initial Dressing Placed:    Initial Cycle:    Initial Suction Setting (mmHg):    Pressure Injury Stages:    Pressure Injury Present Within 24 Hours of Hospital Admission:    If This Pressure Injury Is Suspected to Be Device Related, Please Select the Device::    Is the Wound Open or Closed:    Wound Assessment Dry 08/24/24 2020   Peri-wound Assessment Flaky 08/24/24 2020   Wound Drainage Amount None 08/24/24 2020   Wound Dressing Status None/open to air 08/24/24 2020   Number of days: 1                    Medications  Scheduled Meds:allopurinoL  (ZYLOPRIM ) tablet 100 mg, 100 mg, Jacob, QDAY  apixaban  (ELIQUIS ) tablet 5 mg, 5 mg, Jacob, BID  atorvastatin  (LIPITOR) tablet 10 mg, 10 mg, Jacob, QDAY  cefTRIAXone  (ROCEPHIN ) IVP 2 g, 2 g, Intravenous, Q24H*  divalproex  (DEPAKOTE  ER) ER tablet 500 mg, 500 mg, Jacob, QHS  empagliflozin  (JARDIANCE ) tablet 10 mg, 10 mg, Jacob, QDAY  [START ON 08/27/2024] ERGOcalciferoL  (vitamin D2) (DRISDOL ) capsule 50,000 Units, 50,000 Units, Jacob, Q7 Days  ezetimibe  (ZETIA ) tablet 10 mg, 10 mg, Jacob, QDAY  fluticasone  propionate (FLONASE ) nasal spray 2 spray, 2 spray, Each Nostril, QAM8  gabapentin  (NEURONTIN ) capsule 300 mg, 300 mg, Jacob, BID  levothyroxine  (SYNTHROID ) tablet 75 mcg, 75 mcg, Jacob, QDAY 30 min before breakfast  lidocaine  (LIDODERM ) 5 % topical patch 2 patch, 2 patch, Topical, QDAY  metoprolol  succinate XL (TOPROL  XL) tablet 50 mg, 50 mg, Jacob, QDAY  pantoprazole  DR (PROTONIX ) tablet 40 mg, 40 mg, Jacob, QDAY(21)  polyethylene glycol 3350  (MIRALAX ) packet 17 g, 1 packet, Jacob, QDAY  [Held by Provider] potassium chloride  SR (K-DUR) tablet 40 mEq, 40 mEq, Jacob, BID  sertraline  (ZOLOFT ) tablet 50 mg, 50 mg, Jacob, QAM8  tamsulosin  (FLOMAX ) capsule 0.4 mg, 0.4 mg, Jacob, QAM8  [Held by Provider] torsemide  (DEMADEX ) tablet 40 mg, 40 mg, Jacob, BID    Continuous Infusions:  PRN and Respiratory Meds:acetaminophen  Q4H PRN, docusate BID PRN, melatonin QHS PRN, milk of magnesium  Q24H PRN, [Held by Provider] ondansetron  Q6H PRN **OR** [Held by Provider] ondansetron  Q6H PRN, polyethylene glycol 3350  QDAY PRN, sennosides-docusate sodium  QDAY PRN                Objective                        Vital Signs: Last Filed                 Vital Signs: 24 Hour Range   BP: 102/54 (12/04 0739)  Temp:  36.7 ?C (98.1 ?F) (12/04 9260)  Pulse: 97 (12/04 0739)  Respirations: 20 PER MINUTE (12/04 0739)  SpO2: 90 % (12/04 0739)  O2 Device: None (Room air) (12/04 0739)  O2 Liter Flow: 2 Lpm (12/04 0327) BP: (96-116)/(53-75)   Temp:  [36.1 ?C (97 ?F)-36.7 ?C (98.1 ?F)]   Pulse:  [81-97]   Respirations:  [14 PER MINUTE-20 PER MINUTE]   SpO2:  [90 %-96 %]   O2 Device: None (Room air)  O2 Liter Flow: 2 Lpm   Intensity Pain Scale (Self Report): 1 (08/25/24 0800) Vitals:    08/24/24 0815 08/24/24 0831 08/25/24 0415   Weight: 123.4 kg (272 lb 0.8 oz) 123.4 kg (272 lb 0.8 oz) 133 kg (293 lb 3.2 oz)       Intake/Output Summary:  (Last 24 hours)    Intake/Output Summary (Last 24 hours) at 08/25/2024 0851  Last data filed at 08/25/2024 0400  Gross per 24 hour Intake 2217 ml   Output 1750 ml   Net 467 ml     Stool Occurrence: 0        Physical Exam  GEN: Awake, alert, NAD  HEENT: EOMI, No scleral icterus,   CV: S1S2 RRR, no murmurs  RESP: No respiratory distress, clear to auscultation, no wheezes or rhonchi  ABD: Soft, obese, non tender  EXT: Trace LE edema with some chronic venous stasis changes    Lab Review  24-hour labs:    Results for orders placed or performed during the hospital encounter of 08/23/24 (from the past 24 hours)   HEMOGLOBIN    Collection Time: 08/24/24 12:32 PM   Result Value Ref Range    Hemoglobin 13.2 (L) 13.5 - 16.5 g/dL   CBC    Collection Time: 08/25/24  4:07 AM   Result Value Ref Range    White Blood Cells 7.50 4.50 - 11.00 10*3/uL    Red Blood Cells 4.36 (L) 4.40 - 5.50 10*6/uL    Hemoglobin 13.6 13.5 - 16.5 g/dL    Hematocrit 58.4 59.9 - 50.0 %    MCV 95.1 80.0 - 100.0 fL    MCH 31.2 26.0 - 34.0 pg    MCHC 32.8 32.0 - 36.0 g/dL    RDW 83.1 (H) 88.9 - 15.0 %    Platelet Count 175 150 - 400 10*3/uL    MPV 9.1 7.0 - 11.0 fL   BASIC METABOLIC PANEL    Collection Time: 08/25/24  4:07 AM   Result Value Ref Range    Sodium 143 137 - 147 mmol/L    Potassium 4.1 3.5 - 5.1 mmol/L    Chloride 99 98 - 110 mmol/L    Glucose 135 (H) 70 - 100 mg/dL    Blood Urea Nitrogen 75 (H) 7 - 25 mg/dL    Creatinine 7.64 (H) 0.40 - 1.24 mg/dL    Calcium  9.1 8.5 - 10.6 mg/dL    CO2 32 (H) 21 - 30 mmol/L    Anion Gap 12 3 - 12    Glomerular Filtration Rate (GFR) 28 (L) >60 mL/min       Point of Care Testing  (Last 24 hours)  Glucose: (!) 135 (08/25/24 0407)    Radiology and other Diagnostics Review:    Last Images past 24 hours:

## 2024-08-25 NOTE — Progress Notes [1]
 END OF SHIFT/PLAN OF CARE NURSING NOTE    Admission Date: 08/23/2024  Length of Stay: LOS: 2 days    Acute events, pain management, nursing interventions, communication with providers: Scheduled meds given. Pain managed with PRN tylenol . Foley removed. Patient urinated about 2 hours after. Most recent bladder scan 18mL. No acute events.     Intake and Output:        Intake/Output Summary (Last 24 hours) at 08/25/2024 1811  Last data filed at 08/25/2024 1642  Gross per 24 hour   Intake 1824 ml   Output 1200 ml   Net 624 ml        Last Bowel Movement Date: 08/24/24    JHFRAT Interventions and Education:   Orientation: person, place, time, situation  Elimination: Urinal or Bathroom  Medications: Anticonvulsants, Antihypertensives, Diuretics, Laxatives, and Psychotropics  Bed and chair alarm on: Yes  Patient Care Equipment: Oxygen Tubing  Mobility: Up with x1 assistance  Mobility Aids: Walker      Patient Education  Patient-specific education provided related to quality/safety:  Fall risk and Pain scale   Patient-specific education provided (other): Use of call light and pain medications.   Cardiac patient-specific education provided: Heart failure management  Learners: Patient  Method/materials used: Verbal teaching  Response to learning: Verbalizes Understanding  Needs reinforcement on:     Patient Goal(s):  Patient will Report breathing has improved by discharge date         Patient will  Verbalize readiness for discharge by discharge date   Other:    Restraints:  No  Restraint Goal: Patient will be free from injury while physically restrained.  See Docflowsheet for restraint documentation, intervention      Heart Failure Nursing Progress Note    Admission Date: 08/23/2024  LOS: 2 days    Admission Weight: 134.1 kg (295 lb 11.2 oz)        Most recent weights (inpatient):   Vitals:    08/24/24 0815 08/24/24 0831 08/25/24 0415   Weight: 123.4 kg (272 lb 0.8 oz) 123.4 kg (272 lb 0.8 oz) 133 kg (293 lb 3.2 oz)     Weight change from previous day: + 9.6kg    Fluid restriction ordered: No    Intake/Output Summary: (Last 24 hours)    Intake/Output Summary (Last 24 hours) at 08/25/2024 1811  Last data filed at 08/25/2024 1642  Gross per 24 hour   Intake 1824 ml   Output 1200 ml   Net 624 ml       Is patient incontinent No    Heart Failure Education provided: Yes     - Medications (including diuretics) reviewed during administration    Anticipated discharge date: 12/05

## 2024-08-26 ENCOUNTER — Encounter: Admit: 2024-08-26 | Discharge: 2024-08-26 | Payer: MEDICARE

## 2024-08-26 VITALS — BP 104/55 | HR 72 | Temp 97.70000°F | Wt 295.7 lb

## 2024-08-26 LAB — H. PYLORI AG, FECES: ~~LOC~~ BKR H. PYLORI AG, FECES: NEGATIVE

## 2024-08-26 MED ORDER — BISACODYL 10 MG RE SUPP
10 mg | Freq: Once | RECTAL | 0 refills | Status: CP
Start: 2024-08-26 — End: ?
  Administered 2024-08-26: 17:00:00 10 mg via RECTAL

## 2024-08-26 NOTE — Progress Notes [1]
 Heart Failure Nursing Progress Note    Admission Date: 08/23/2024  LOS: 3 days    Admission Weight: 134.1 kg (295 lb 11.2 oz)        Most recent weights (inpatient):   Vitals:    08/24/24 0831 08/25/24 0415 08/26/24 0500   Weight: 123.4 kg (272 lb 0.8 oz) 133 kg (293 lb 3.2 oz) 133.5 kg (294 lb 6.4 oz)     Weight change from previous day: +0.5 lbs    Fluid restriction ordered: n/a    Intake/Output Summary: (Last 24 hours)    Intake/Output Summary (Last 24 hours) at 08/26/2024 0645  Last data filed at 08/26/2024 9396  Gross per 24 hour   Intake 572 ml   Output 1750 ml   Net -1178 ml       Is patient incontinent No    Heart Failure Education provided: Yes     - Heart Failure Zone Sheet and daily weight discussed  - Low sodium diet discussed    Anticipated discharge date: TBD  Discharge goals: Go home      Daily Assessment of Patient Stated Goals:    Short Term Goal Identified by patient (Short Term=during hospitalization):  Adequate output after foley removal.

## 2024-08-26 NOTE — Patient Refusal [[id number]]
 Patient/family declined:  Bed/chair alarm and W/in arms reach during toileting/showering.    Patient/family educated on importance of intervention to their safety/quality of care. Patient/family continues to decline care.    Reason Why Patient/Family declined:  Patient says they will call when they want to get up or need any other assistance. .    Individualized safety and/or care plan implemented. If additional safety measures implemented, please list them.     Escalated to:  Unit coordinator/charge nurse.

## 2024-08-26 NOTE — Progress Notes [1]
 General Progress Note    Name: Jacob Dickerson        MRN: [id number]          DOB: 02/02/1946            Age: 78 y.o.  Admission Date: 08/23/2024       LOS: 3 days    Date of Service: 08/26/2024    Assessment/Plan:      Jacob Dickerson is a 78 y.o. 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 EGD with erosive gastropathy but no active bleeding. PPI to continue. Resume eliquis  and cardiac meds on 12/4 and monitor. Likely d/c 12/6 if BP, Cr stable and tolerating diet.     Erosive gastropathy  Melena, UGIB  -melena started Saturday, 11/29. On eliquis  pta for a fib  -hgb stable on admit at 14.5; BUN elevated to 104  -CTA a/p 12/2: no evidence of active GIB. No specific findings to explain abdominal tenderness.   -s/p IV pantoprazole  80 mg x1 in ED; type and crossed   -cont IV PPI 40 mg BID  -GI consulted for consideration for EGD. Will keep NPO pending formal GI recs  -check iron  studies  -EGD on 12/2 with erosive gastropathy, no active bleeding  -continue PPI daily for 3 months  -ADAT  -resume eliquis  on 12/4 and monitor    Constipation  -bowel regimen, suppository    Chronic HFimpEF  -cont pta Toprol  XL 50 mg daily with hold parameters  -hold pta jardiance , torsemide  and metolazone  for now with GIB and suspected pre-renal AKI  -ctm volume status closely   -ctm daily weights, Is/Os  -resume toprol  XL and jardiance  on 12/4  -continue to hold torsemide  for now, resume 12/6 or at discharge    Hypotension, resolving  -holding antihypertensives for now, resume as able  -slowly resume cardiac meds     AKI on CKD Stage IIIa, resolved  Lactic acidosis, mild -resolved  -follows with [facility]  -bl Cr ~2.3; Cr on admit 3.18 with elevated BUN to 104; LA 2.3 --> 1.3 s/p 500 cc IVF in ED  -suspect pre-renal AKI in setting of GIB w/ ongoing diuretic use +/- obstructive  in setting of acute urinary retention   Plan  -cont foley placement for now  -check UA, urine Na, urine Cr and urea  -ctm strict I's and O's  -avoid nephrotoxins  -cont gentle mIVF while NPO  -hold pta torsemide  40 mg BID, metolazone  5 mg prn and jardiance  10 mg daily for now  -further workup with renal US  and nephrology consult if Cr continues to worsen 12/3  - slowly resume cardiac meds     BPH  Acute urinary retention  Pyuria  -required foley placement in ED  -cont pta tamsulosin    -cont foley placement for now  -empiric IV CTX started on admission, follow urine culture  - urine culture negative, stop abx  - voiding trial on 12/4, completed     Hypokalemia, resolved  -K 3.3, give K-dur 40 mEq x1  -hold pta K-dur 40 mEq BID for now given holding pta diuretics  -ctm daily metabolic panel and replace as needed     A fib  -cont rate control with metoprolol   -holding pta eliquis  with GIB, resume on 12/4 and  monitor     HLD  -cont pta atorvastatin  10 mg daily and zetia  10 mg daily      Dep/anx  -cont pta sertraline  50 mg daily      Gout  -renally dose reduce pta allopurinol  given AKI       Diet: Regular  VTE ppx: SCDs; hold eliquis  with GIB  LDA: foley    Code status: DNAR-FI, discussed on admit  Disposition/discharge planning: admit to inpatient        [facility] Medicine    Voalte is the preferred method of communication for Internal Medicine. Please search voalte directory for Med Private First Call assigned team name to find the correct covering doctor or nurse practitioner 24/7. You can find the assigned team in the information bar to the left in the patient chart under service.  Personal Voaltes and pagers are not answered at all hours.    Total Time Today was 55 minutes in the following activities: Preparing to see the patient, Obtaining and/or reviewing separately obtained history, Performing a medically appropriate examination and/or evaluation, Counseling and educating the patient/family/caregiver, Ordering medications, tests, or procedures, Referring and communication with other health care professionals (when not separately reported), Documenting clinical information in the electronic or other health record, and Care coordination (not separately reported)      Assessment & Plan  AKI (acute kidney injury)    Primary hypertension    Hyperlipidemia    Atrial fibrillation (CMS-HCC)    Coronary artery disease involving native coronary artery of native heart without angina pectoris    Acute kidney injury    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, adult (CMS-HCC)    BPH (benign prostatic hyperplasia)    Gout    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)    Chronic systolic heart failure (CMS-HCC)    Lower urinary tract symptoms (LUTS)    Hypotension due to hypovolemia    Dark stools       Present on Admission:   Lower urinary tract symptoms (LUTS)   Chronic systolic heart failure (CMS-HCC)   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)   Gout   BPH (benign prostatic hyperplasia)   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, adult (CMS-HCC)   Acute kidney injury   Atrial fibrillation (CMS-HCC)   Coronary artery disease involving native coronary artery of native heart without angina pectoris   Primary hypertension   Hyperlipidemia   AKI (acute kidney injury)     _______________________________________________________________________    Subjective     Says he is constipated last couple of days. Urinating OK. Abdominal bloating and distention. Poor appetite. Sore throat from endoscopy. Doesn't feel comfortable going home today due to how far away he lives. Resuming cardiac meds and eliquis  and seeing how he tolerates. Bowel regimen ordered. Hopefully d/c 12/6 if BP and Cr ok and no recurrent concerns for GI bleed after resuming eliquis .          Malnutrition Details: Active Wounds                            Wounds Blister (Not pressure) Right;Lower;Lateral;Anterior;Posterior;Medial Leg (Active)   08/24/24 0000   Wound Type: Blister (Not pressure)   Orientation: Right;Lower;Lateral;Anterior;Posterior;Medial   Location: Leg   Wound Location Comments:    Initial Wound Site Closure:    Initial Dressing Placed:    Initial Cycle:    Initial Suction  Setting (mmHg):    Pressure Injury Stages:    Pressure Injury Present Within 24 Hours of Hospital Admission:    If This Pressure Injury Is Suspected to Be Device Related, Please Select the Device::    Is the Wound Open or Closed:    Wound Assessment Dressing not removed for assessment 08/26/24 0828   Peri-wound Assessment Intact 08/24/24 0900   Wound Drainage Amount Scant 08/24/24 0445   Wound Drainage Description Serosanguineous 08/24/24 0445   Wound Dressing Status Intact 08/26/24 0828   Wound Care Dressing changed or new application 08/24/24 0000   Wound Dressing and/or Treatment Foam 08/26/24 0828   Dressing Change Due 08/31/24 08/26/24 0828   Number of days: 2       Wounds Blister (Not pressure) Left;Lower;Lateral;Medial;Anterior;Posterior Leg (Active)   08/24/24 0000   Wound Type: Blister (Not pressure)   Orientation: Left;Lower;Lateral;Medial;Anterior;Posterior   Location: Leg   Wound Location Comments:    Initial Wound Site Closure:    Initial Dressing Placed:    Initial Cycle:    Initial Suction Setting (mmHg):    Pressure Injury Stages:    Pressure Injury Present Within 24 Hours of Hospital Admission:    If This Pressure Injury Is Suspected to Be Device Related, Please Select the Device::    Is the Wound Open or Closed:    Wound Assessment Dry 08/26/24 0828   Peri-wound Assessment Flaky 08/26/24 0828   Wound Drainage Amount None 08/26/24 0828   Wound Dressing Status None/open to air 08/26/24 0828   Number of days: 2                    Medications  Scheduled Meds:allopurinoL  (ZYLOPRIM ) tablet 100 mg, 100 mg, Oral, QDAY  apixaban  (ELIQUIS ) tablet 5 mg, 5 mg, Oral, BID  atorvastatin  (LIPITOR) tablet 10 mg, 10 mg, Oral, QDAY  divalproex  (DEPAKOTE  ER) ER tablet 500 mg, 500 mg, Oral, QHS  empagliflozin  (JARDIANCE ) tablet 10 mg, 10 mg, Oral, QDAY  [START ON 08/27/2024] ERGOcalciferoL  (vitamin D2) (DRISDOL ) capsule 50,000 Units, 50,000 Units, Oral, Q7 Days  ezetimibe  (ZETIA ) tablet 10 mg, 10 mg, Oral, QDAY  fluticasone  propionate (FLONASE ) nasal spray 2 spray, 2 spray, Each Nostril, QAM8  gabapentin  (NEURONTIN ) capsule 300 mg, 300 mg, Oral, BID  levothyroxine  (SYNTHROID ) tablet 75 mcg, 75 mcg, Oral, QDAY 30 min before breakfast  lidocaine  (LIDODERM ) 5 % topical patch 2 patch, 2 patch, Topical, QDAY  metoprolol  succinate XL (TOPROL  XL) tablet 50 mg, 50 mg, Oral, QDAY  pantoprazole  DR (PROTONIX ) tablet 40 mg, 40 mg, Oral, QDAY(21)  polyethylene glycol 3350  (MIRALAX ) packet 17 g, 1 packet, Oral, QDAY  [Held by Provider] potassium chloride  SR (K-DUR) tablet 40 mEq, 40 mEq, Oral, BID  sertraline  (ZOLOFT ) tablet 50 mg, 50 mg, Oral, QAM8  tamsulosin  (FLOMAX ) capsule 0.4 mg, 0.4 mg, Oral, QAM8  [Held by Provider] torsemide  (DEMADEX ) tablet 20 mg, 20 mg, Oral, BID    Continuous Infusions:  PRN and Respiratory Meds:acetaminophen  Q4H PRN, docusate BID PRN, melatonin QHS PRN, milk of magnesium  Q24H PRN, [Held by Provider] ondansetron  Q6H PRN **OR** [Held by Provider] ondansetron  Q6H PRN, polyethylene glycol 3350  QDAY PRN, sennosides-docusate sodium  QDAY PRN                Objective                        Vital Signs: Last Filed  Vital Signs: 24 Hour Range   BP: 108/56 (12/05 0730)  Temp: 36.3 ?C (97.4 ?F) (12/05 0730)  Pulse: 76 (12/05 0730)  Respirations: 20 PER MINUTE (12/05 0730)  SpO2: 92 % (12/05 0730)  O2 Device: None (Room air) (12/05 0730)  O2 Liter Flow: 2 Lpm (12/04 2101) BP: (91-130)/(45-94)   Temp:  [36.3 ?C (97.3 ?F)-36.7 ?C (98.1 ?F)]   Pulse:  [76-96]   Respirations:  [18 PER MINUTE-22 PER MINUTE]   SpO2:  [92 %-93 %]   O2 Device: None (Room air)  O2 Liter Flow: 2 Lpm   Intensity Pain Scale (Self Report): 6 (08/26/24 9171) Vitals:    08/24/24 0831 08/25/24 0415 08/26/24 0500   Weight: 123.4 kg (272 lb 0.8 oz) 133 kg (293 lb 3.2 oz) 133.5 kg (294 lb 6.4 oz)       Intake/Output Summary:  (Last 24 hours)    Intake/Output Summary (Last 24 hours) at 08/26/2024 1244  Last data filed at 08/26/2024 0900  Gross per 24 hour   Intake 450 ml   Output 1600 ml   Net -1150 ml     Stool Occurrence: 1        Physical Exam  GEN: Awake, alert, NAD, overall pleasant but appears unwell  CV: S1S2 RRR, no murmurs  RESP: Diminished but clear to auscultation  ABD: Soft, obese, non tender, +BS noted  EXT: Trace LE edema with some chronic venous stasis changes    Lab Review  24-hour labs:    Results for orders placed or performed during the hospital encounter of 08/23/24 (from the past 24 hours)   H. PYLORI AG, FECES    Collection Time: 08/25/24  2:25 PM    Specimen: Bowel Contents; Feces   Result Value Ref Range    H. Pylori, AG, Feces Negative Negative, Test Invalid   BASIC METABOLIC PANEL    Collection Time: 08/26/24  3:45 AM   Result Value Ref Range    Sodium 142 137 - 147 mmol/L    Potassium 3.8 3.5 - 5.1 mmol/L    Chloride 100 98 - 110 mmol/L    Glucose 161 (H) 70 - 100 mg/dL    Blood Urea Nitrogen 59 (H) 7 - 25 mg/dL    Creatinine 7.95 (H) 0.40 - 1.24 mg/dL    Calcium  9.6 8.5 - 10.6 mg/dL    CO2 33 (H) 21 - 30 mmol/L    Anion Gap 9 3 - 12    Glomerular Filtration Rate (GFR) 33 (L) >60 mL/min   CBC    Collection Time: 08/26/24  3:46 AM   Result Value Ref Range    White Blood Cells 6.70 4.50 - 11.00 10*3/uL    Red Blood Cells 4.29 (L) 4.40 - 5.50 10*6/uL    Hemoglobin 13.5 13.5 - 16.5 g/dL    Hematocrit 58.9 59.9 - 50.0 %    MCV 95.6 80.0 - 100.0 fL    MCH 31.4 26.0 - 34.0 pg    MCHC 32.9 32.0 - 36.0 g/dL    RDW 83.1 (H) 88.9 - 15.0 %    Platelet Count 162 150 - 400 10*3/uL    MPV 9.0 7.0 - 11.0 fL       Point of Care Testing  (Last 24 hours)  Glucose: (!) 161 (08/26/24 0345)    Radiology and other Diagnostics Review:    Last Images past 24 hours:

## 2024-08-26 NOTE — Progress Notes [1]
 END OF SHIFT/PLAN OF CARE NURSING NOTE    Admission Date: 08/23/2024  Length of Stay: LOS: 3 days    Acute events, pain management, nursing interventions, communication with providers: Scheduled meds given. Pain managed with PRN tylenol . No acute events.      Intake and Output:        Intake/Output Summary (Last 24 hours) at 08/26/2024 1826  Last data filed at 08/26/2024 1651  Gross per 24 hour   Intake 894 ml   Output 1900 ml   Net -1006 ml        Last Bowel Movement Date: 08/24/24    JHFRAT Interventions and Education:   Orientation: person, place, time, situation  Elimination: Bathroom  Medications: Anticonvulsants, Antihypertensives, Diuretics, Laxatives, and Psychotropics  Bed and chair alarm on: No, patient refused  Patient Care Equipment: None  Mobility: Up with x1 assistance  Mobility Aids: Walker      Patient Education  Patient-specific education provided related to quality/safety:  Fall risk and Pain scale   Patient-specific education provided (other): Use of call light and pain medications.   Cardiac patient-specific education provided: Heart failure management  Learners: Patient  Method/materials used: Verbal teaching  Response to learning: Verbalizes Understanding  Needs reinforcement on:     Patient Goal(s):  Patient will Verbalize pain has improved by discharge date         Patient will  Verbalize readiness for discharge by discharge date   Other:    Restraints:  No  Restraint Goal: Patient will be free from injury while physically restrained.  See Docflowsheet for restraint documentation, intervention      Heart Failure Nursing Progress Note    Admission Date: 08/23/2024  LOS: 3 days    Admission Weight: 134.1 kg (295 lb 11.2 oz)        Most recent weights (inpatient):   Vitals:    08/24/24 0831 08/25/24 0415 08/26/24 0500   Weight: 123.4 kg (272 lb 0.8 oz) 133 kg (293 lb 3.2 oz) 133.5 kg (294 lb 6.4 oz)     Weight change from previous day: + 0.5kg    Fluid restriction ordered: No    Intake/Output Summary: (Last 24 hours)    Intake/Output Summary (Last 24 hours) at 08/26/2024 1826  Last data filed at 08/26/2024 1651  Gross per 24 hour   Intake 894 ml   Output 1900 ml   Net -1006 ml       Is patient incontinent No    Heart Failure Education provided: Yes     - Medications (including diuretics) reviewed during administration    Anticipated discharge date: 12/6

## 2024-08-27 ENCOUNTER — Inpatient Hospital Stay: Admit: 2024-08-23 | Discharge: 2024-08-27 | Payer: MEDICARE

## 2024-08-27 ENCOUNTER — Encounter: Admit: 2024-08-27 | Discharge: 2024-08-27 | Payer: MEDICARE

## 2024-08-27 DIAGNOSIS — J449 Chronic obstructive pulmonary disease, unspecified: Secondary | ICD-10-CM

## 2024-08-27 DIAGNOSIS — Z79890 Hormone replacement therapy (postmenopausal): Secondary | ICD-10-CM

## 2024-08-27 DIAGNOSIS — Z6841 Body Mass Index (BMI) 40.0 and over, adult: Secondary | ICD-10-CM

## 2024-08-27 DIAGNOSIS — E876 Hypokalemia: Secondary | ICD-10-CM

## 2024-08-27 DIAGNOSIS — I2510 Atherosclerotic heart disease of native coronary artery without angina pectoris: Secondary | ICD-10-CM

## 2024-08-27 DIAGNOSIS — N401 Enlarged prostate with lower urinary tract symptoms: Secondary | ICD-10-CM

## 2024-08-27 DIAGNOSIS — N179 Acute kidney failure, unspecified: Secondary | ICD-10-CM

## 2024-08-27 DIAGNOSIS — I4821 Permanent atrial fibrillation: Secondary | ICD-10-CM

## 2024-08-27 DIAGNOSIS — E872 Acidosis, unspecified: Secondary | ICD-10-CM

## 2024-08-27 DIAGNOSIS — Z953 Presence of xenogenic heart valve: Secondary | ICD-10-CM

## 2024-08-27 DIAGNOSIS — N1831 Chronic kidney disease, stage 3a (CMS-HCC): Secondary | ICD-10-CM

## 2024-08-27 DIAGNOSIS — E781 Pure hyperglyceridemia: Secondary | ICD-10-CM

## 2024-08-27 DIAGNOSIS — I9589 Other hypotension: Secondary | ICD-10-CM

## 2024-08-27 DIAGNOSIS — Z87891 Personal history of nicotine dependence: Secondary | ICD-10-CM

## 2024-08-27 DIAGNOSIS — M109 Gout, unspecified: Secondary | ICD-10-CM

## 2024-08-27 DIAGNOSIS — Z8249 Family history of ischemic heart disease and other diseases of the circulatory system: Secondary | ICD-10-CM

## 2024-08-27 DIAGNOSIS — E66813 Obesity, class 3: Secondary | ICD-10-CM

## 2024-08-27 DIAGNOSIS — Z79899 Other long term (current) drug therapy: Secondary | ICD-10-CM

## 2024-08-27 DIAGNOSIS — Z7984 Long term (current) use of oral hypoglycemic drugs: Secondary | ICD-10-CM

## 2024-08-27 DIAGNOSIS — K649 Unspecified hemorrhoids: Secondary | ICD-10-CM

## 2024-08-27 DIAGNOSIS — E861 Hypovolemia: Secondary | ICD-10-CM

## 2024-08-27 DIAGNOSIS — E039 Hypothyroidism, unspecified: Secondary | ICD-10-CM

## 2024-08-27 DIAGNOSIS — I130 Hypertensive heart and chronic kidney disease with heart failure and stage 1 through stage 4 chronic kidney disease, or unspecified chronic kidney disease: Secondary | ICD-10-CM

## 2024-08-27 DIAGNOSIS — Z66 Do not resuscitate: Secondary | ICD-10-CM

## 2024-08-27 DIAGNOSIS — K5900 Constipation, unspecified: Secondary | ICD-10-CM

## 2024-08-27 DIAGNOSIS — R338 Other retention of urine: Secondary | ICD-10-CM

## 2024-08-27 DIAGNOSIS — I5042 Chronic combined systolic (congestive) and diastolic (congestive) heart failure: Secondary | ICD-10-CM

## 2024-08-27 DIAGNOSIS — I428 Other cardiomyopathies: Secondary | ICD-10-CM

## 2024-08-27 DIAGNOSIS — I352 Nonrheumatic aortic (valve) stenosis with insufficiency: Secondary | ICD-10-CM

## 2024-08-27 DIAGNOSIS — Z7901 Long term (current) use of anticoagulants: Secondary | ICD-10-CM

## 2024-08-27 DIAGNOSIS — K3189 Other diseases of stomach and duodenum: Secondary | ICD-10-CM

## 2024-08-27 MED ORDER — METOPROLOL SUCCINATE 50 MG PO TB24
50 mg | ORAL_TABLET | Freq: Every day | ORAL | 1 refills | 90.00000 days | Status: AC
Start: 2024-08-27 — End: ?
  Filled 2024-08-27: qty 90, 90d supply, fill #0

## 2024-08-27 MED ORDER — PANTOPRAZOLE 40 MG PO TBEC
40 mg | ORAL_TABLET | Freq: Every day | ORAL | 1 refills | 90.00000 days | Status: AC
Start: 2024-08-27 — End: ?
  Filled 2024-08-27: qty 90, 90d supply, fill #0

## 2024-08-27 MED ORDER — TORSEMIDE 20 MG PO TAB
20 mg | ORAL_TABLET | Freq: Two times a day (BID) | ORAL | 0 refills | Status: CN
Start: 2024-08-27 — End: ?

## 2024-08-27 MED ORDER — POTASSIUM CHLORIDE 20 MEQ PO TBTQ
20 meq | ORAL_TABLET | Freq: Two times a day (BID) | ORAL | 0 refills | 30.00000 days | Status: AC
Start: 2024-08-27 — End: ?
  Filled 2024-08-27: qty 60, 30d supply, fill #0

## 2024-08-27 NOTE — Patient Refusal [[id number]]
 Patient/family declined:  Bed/chair alarm and W/in arms reach during toileting/showering.    Patient/family educated on importance of intervention to their safety/quality of care. Patient/family continues to decline care.    Reason Why Patient/Family declined:  Patient says they will call when they want to get up or need any other assistance. .    Individualized safety and/or care plan implemented. If additional safety measures implemented, please list them.     Escalated to:  Unit coordinator/charge nurse.

## 2024-08-27 NOTE — Progress Notes [1]
 Heart Failure Nursing Progress Note    Admission Date: 08/23/2024  LOS: 4 days    Admission Weight: 134.1 kg (295 lb 11.2 oz)        Most recent weights (inpatient):   Vitals:    08/25/24 0415 08/26/24 0500 08/27/24 9385   Weight: 133 kg (293 lb 3.2 oz) 133.5 kg (294 lb 6.4 oz) 134.1 kg (295 lb 9.6 oz)     Weight change from previous day:+0.6    Fluid restriction ordered: N/A    Intake/Output Summary: (Last 24 hours)    Intake/Output Summary (Last 24 hours) at 08/27/2024 9385  Last data filed at 08/27/2024 0126  Gross per 24 hour   Intake 794 ml   Output 1000 ml   Net -206 ml       Is patient incontinent No    Heart Failure Education provided: Yes     - Medications (including diuretics) reviewed during administration    Anticipated discharge date: 12/6        Daily Assessment of Patient Stated Goals:    Short Term Goal Identified by patient (Short Term=during hospitalization):  Go back to [place]

## 2024-08-27 NOTE — Case Mgmt DC Plan [600024]
 Medicaid Transportation Summary      Requested By: SEFERINO Medford Lee  Destination:  [facility] [address]  [place]: [place]: [place] Zip: [address] Phone: [phone]    Transportation Arrival for p/u :  12:30 PM  Medicaid Transportation Coordinator: [employer] at [phone]   Representative:   Joscelyn  Trip Reservation Number: [id number]  CMA updated floor RN @ [phone] and NCM via voalte     Please call [employer] at [phone] for further assistance.       Halford Ernst  Lead Case Management Assistant  For additional assistance please contact NCM within this note.

## 2024-08-27 NOTE — Discharge Instr - Appointments [65]
 Please contact your PCP and request hospital follow-up. Our Cardiology team has requested a hospital follow-up appointment for you.
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
## 2024-08-27 NOTE — Case Mgmt DC Plan [600024]
 Medicaid Transportation Summary      Requested By: SEFERINO Medford Lee  Destination:  [facility] [address]  [place]: [place]: [place] Zip: [address] Phone: [phone]    Transportation Arrival for p/u :  11:30 AM  Medicaid Transportation Coordinator: MTS at [phone]   Representative:  Arne KATHEE Collins Reservation Number: [id number]  CMA updated floor RN @ [phone] and SW via voalte     Please call MTS at [phone] for further assistance.      Halford Ernst  Lead Case Management Assistant  For additional assistance please contact NCM within this note.

## 2024-08-28 ENCOUNTER — Encounter: Admit: 2024-08-28 | Discharge: 2024-08-28 | Payer: MEDICARE

## 2024-08-31 ENCOUNTER — Encounter: Admit: 2024-08-31 | Discharge: 2024-08-31 | Payer: MEDICARE

## 2024-08-31 MED FILL — TORSEMIDE 20 MG PO TAB: 20 mg | ORAL | 30 days supply | Qty: 60 | Fill #0 | Status: AC

## 2024-09-05 ENCOUNTER — Encounter: Admit: 2024-09-05 | Discharge: 2024-09-05 | Payer: MEDICARE

## 2024-09-05 NOTE — Telephone Encounter [36]
 This staff called and spoke with [name], RN at [facility] confirming pt information with 2 pt ID. Relayed recommendation from Verneita Flock, NP to continue current plan and we will re-evaluate when we have lab results. [name] states she will make sure to fax results once she has them. She VU of POC.

## 2024-09-05 NOTE — Telephone Encounter [36]
 [name] with [facility] called from [phone] asking for call back to discuss pt.     Patient Status  patient is an established patient with Heart Failure Team.        Signs and Symptoms  Discharged on 12/6 from [place], discharge dx was AKI    Ankles very swollen today  Blister on legs and very red     Pt to have labd sone tomorrow 12/16        Medication Review  Metolazone  taken- this AM and 12/12  Torsemide  40 mg BID since 12/12     apixaban  (ELIQUIS ) 5 mg, TWICE DAILY   atorvastatin  (LIPITOR) 10 mg, DAILY   empagliflozin  (JARDIANCE ) 25 mg, Oral, DAILY   ezetimibe  (ZETIA ) 10 mg, Oral, DAILY   metOLazone  (ZAROXOLYN ) 2.5 mg, Oral, DAILY  PRN, Administer if weight gain of 3 lbs over night or 5 lbs in a week   metoprolol  succinate XL (TOPROL  XL) 50 mg, Oral, DAILY   potassium chloride  SR (K-DUR) 20 mEq tablet 20 mEq, Oral, TWICE DAILY   torsemide  (DEMADEX ) 20 mg, Oral, TWICE DAILY     Fluid/sodium Intake  Patient is drinking under  64 oz of fluid daily       Date Weight B/P Pulse   12/15 294     12/13 299     12/12 305

## 2024-09-07 ENCOUNTER — Encounter: Admit: 2024-09-07 | Discharge: 2024-09-07 | Payer: MEDICARE

## 2024-09-07 DIAGNOSIS — I5040 Unspecified combined systolic (congestive) and diastolic (congestive) heart failure: Principal | ICD-10-CM

## 2024-09-07 LAB — BASIC METABOLIC PANEL
ANION GAP: 10 — ABNORMAL HIGH (ref 8–16)
BLD UREA NITROGEN: 80 — ABNORMAL HIGH (ref 8.4–25.7)
BUN/CREATININE RATIO: 27 — ABNORMAL HIGH (ref 10–20)
CALCIUM: 9.3
CHLORIDE: 93 — ABNORMAL LOW (ref 98–107)
CO2: 30
CREATININE: 2.9 — ABNORMAL HIGH (ref 0.70–1.30)
GFR ESTIMATED: 21
GLUCOSE,PANEL: 142 — ABNORMAL HIGH (ref 70–105)
POTASSIUM: 3.5
SODIUM: 141

## 2024-09-07 NOTE — Progress Notes [1]
 RE: GEORDIE NOONEY  DOB: [birth year]  MRN: [id number]    Medical Records request for continuity of care.  [name] is scheduled for an appointment  in the Advanced Heart Failure clinic at [facility].    Please fax records to [phone]   [facility]  CVM - Advanced Heart Failure/Heart Transplant/VAD Clinic  [address]   [place]. [address]    [place], [place] [address]  Ph. [phone] Fax. [phone]    Requested Records: lab results from 12/16        CONFIDENTIALITY NOTICE  This facsimile transmission contains confidential information, some or all of which may be protected health information as defined by the federal Health Insurance Portability & Accountability Act (HIPAA) Privacy Rule.  The information contained in this facsimile is intended only for the exclusive and confidential use of the designated recipient named above.  If you are not the designated recipient (or an employee or agent responsible for delivering this facsimile transmission to the designated recipient), you are hereby notified that any disclosure, dissemination, distribution or copying of this information is strictly prohibited and may be subject to legal restriction or sanction. If you have received this transmission in error, immediately notify the Privacy Officer at [phone] to arrange for the return or destruction of the information and all copies.    [address] - Mail [phone] - [place], [place]  [address] - Phone [phone] - Fax [phone] - [email or web address]

## 2024-09-07 NOTE — Telephone Encounter [36]
 Called and spoke to [place] with [facility]. Reviewed pt medications. Changes noted: K+ 20 mEq BID and torsemide  40 mg BID. No SOB, swelling is bad in his legs. RLE has blisters, redness from calf to foot started on doxycycline, LLE has fluid blister 9 cm x 5 cm. 301 lbs today, 299 lbs yesterday. Updated DR who recommended hold metolazone  until OV on 12/22. If swelling progresses worsens to go to ER. Called Jennette back who VU.

## 2024-09-09 ENCOUNTER — Encounter: Admit: 2024-09-09 | Discharge: 2024-09-09 | Payer: MEDICARE

## 2024-09-09 ENCOUNTER — Inpatient Hospital Stay: Admit: 2024-09-09 | Discharge: 2024-09-14 | Payer: MEDICARE

## 2024-09-09 ENCOUNTER — Emergency Department: Admit: 2024-09-09 | Discharge: 2024-09-09 | Payer: MEDICARE

## 2024-09-09 VITALS — BP 130/76 | HR 110 | Temp 97.50000°F | Ht 65.0 in | Wt 295.6 lb

## 2024-09-09 DIAGNOSIS — I5023 Acute on chronic systolic (congestive) heart failure: Secondary | ICD-10-CM

## 2024-09-09 DIAGNOSIS — I5040 Unspecified combined systolic (congestive) and diastolic (congestive) heart failure: Secondary | ICD-10-CM

## 2024-09-09 DIAGNOSIS — Z952 Presence of prosthetic heart valve: Secondary | ICD-10-CM

## 2024-09-09 DIAGNOSIS — N1832 Stage 3b chronic kidney disease (CMS-HCC): Secondary | ICD-10-CM

## 2024-09-09 DIAGNOSIS — Z7901 Long term (current) use of anticoagulants: Secondary | ICD-10-CM

## 2024-09-09 LAB — KC ED MAIN ECG TRIAGE ONLY
Q-T INTERVAL: 408 ms
QRS DURATION: 142 ms
QTC CALCULATION (BAZETT): 518 ms
R AXIS: -41 degrees
T AXIS: 108 degrees
VENTRICULAR RATE: 97 {beats}/min

## 2024-09-09 LAB — CBC AND DIFF
~~LOC~~ BKR ABSOLUTE BASO COUNT: 0.1 10*3/uL — ABNORMAL LOW (ref 0.00–0.20)
~~LOC~~ BKR ABSOLUTE EOS COUNT: 0.1 10*3/uL — ABNORMAL HIGH (ref 0.00–0.45)
~~LOC~~ BKR ABSOLUTE LYMPH COUNT: 1.2 10*3/uL (ref 1.00–4.80)
~~LOC~~ BKR ABSOLUTE MONO COUNT: 1.1 10*3/uL — ABNORMAL HIGH (ref 0.00–0.80)
~~LOC~~ BKR ABSOLUTE NEUTROPHIL: 6.9 10*3/uL (ref 1.80–7.00)
~~LOC~~ BKR HEMATOCRIT: 40 % (ref 40.0–50.0)
~~LOC~~ BKR MCHC: 33 g/dL — ABNORMAL LOW (ref 32.0–36.0)
~~LOC~~ BKR MCV: 93 fL (ref 80.0–100.0)
~~LOC~~ BKR MDW (MONOCYTE DISTRIBUTION WIDTH): 19 (ref <=20.6)
~~LOC~~ BKR MPV: 9 fL — ABNORMAL HIGH (ref 7.0–11.0)
~~LOC~~ BKR PLATELET COUNT: 176 10*3/uL — ABNORMAL HIGH (ref 150–400)
~~LOC~~ BKR RBC COUNT: 4.3 10*6/uL — ABNORMAL LOW (ref 4.40–5.50)
~~LOC~~ BKR WBC COUNT: 9.4 10*3/uL (ref 4.50–11.00)

## 2024-09-09 LAB — COMPREHENSIVE METABOLIC PANEL
~~LOC~~ BKR ALBUMIN: 3.7 g/dL (ref 3.5–5.0)
~~LOC~~ BKR ALK PHOSPHATASE: 140 U/L — ABNORMAL HIGH (ref 25–110)
~~LOC~~ BKR CALCIUM: 9.2 mg/dL (ref 8.5–10.6)
~~LOC~~ BKR TOTAL BILIRUBIN: 0.6 mg/dL (ref 0.2–1.3)
~~LOC~~ BKR TOTAL PROTEIN: 7.3 g/dL — ABNORMAL LOW (ref 6.0–8.0)

## 2024-09-09 LAB — PROTIME INR (PT): ~~LOC~~ BKR INR: 1.4 g/dL — ABNORMAL HIGH (ref 0.9–1.2)

## 2024-09-09 LAB — HIGH SENSITIVITY TROPONIN I 2 HOUR
~~LOC~~ BKR HIGH SENSITIVITY TROPONIN I 2 HOUR: 47 ng/L — ABNORMAL HIGH (ref <20.0)
~~LOC~~ BKR HIGH SENSITIVITY TROPONIN I DELTA VALUE: 3.6

## 2024-09-09 LAB — MAGNESIUM: ~~LOC~~ BKR MAGNESIUM: 1.8 mg/dL — ABNORMAL HIGH (ref 1.6–2.6)

## 2024-09-09 LAB — NT-PRO-BNP: ~~LOC~~ BKR NT-PRO-BNP: 170 pg/mL — ABNORMAL HIGH (ref 26.0–<450)

## 2024-09-09 LAB — HIGH SENSITIVITY TROPONIN I 0 HOUR: ~~LOC~~ BKR HIGH SENSITIVITY TROPONIN I 0 HOUR: 43 ng/L — ABNORMAL HIGH (ref <20.0)

## 2024-09-09 MED ORDER — TAMSULOSIN 0.4 MG PO CAP
.4 mg | Freq: Every morning | ORAL | 0 refills | Status: DC
Start: 2024-09-09 — End: 2024-09-14
  Administered 2024-09-10 – 2024-09-13 (×4): 0.4 mg via ORAL

## 2024-09-09 MED ORDER — METOPROLOL SUCCINATE 50 MG PO TB24
50 mg | Freq: Every day | ORAL | 0 refills | Status: DC
Start: 2024-09-09 — End: 2024-09-14
  Administered 2024-09-11 – 2024-09-13 (×3): 50 mg via ORAL

## 2024-09-09 MED ORDER — ONDANSETRON 4 MG PO TBDI
4 mg | ORAL | 0 refills | Status: DC | PRN
Start: 2024-09-09 — End: 2024-09-14

## 2024-09-09 MED ORDER — ACETAZOLAMIDE 250 MG PO TAB
500 mg | Freq: Once | ORAL | 0 refills | Status: CP
Start: 2024-09-09 — End: ?
  Administered 2024-09-09: 22:00:00 500 mg via ORAL

## 2024-09-09 MED ORDER — DICLOFENAC SODIUM 1 % TP GEL
2 g | Freq: Four times a day (QID) | TOPICAL | 0 refills | Status: DC
Start: 2024-09-09 — End: 2024-09-14
  Administered 2024-09-10: 01:00:00 2 g via TOPICAL

## 2024-09-09 MED ORDER — APIXABAN 5 MG PO TAB
5 mg | Freq: Two times a day (BID) | ORAL | 0 refills | Status: DC
Start: 2024-09-09 — End: 2024-09-14
  Administered 2024-09-10 – 2024-09-13 (×8): 5 mg via ORAL

## 2024-09-09 MED ORDER — POLYETHYLENE GLYCOL 3350 17 GRAM PO PWPK
1 | Freq: Every day | ORAL | 0 refills | Status: DC | PRN
Start: 2024-09-09 — End: 2024-09-14

## 2024-09-09 MED ORDER — ACETAMINOPHEN 325 MG PO TAB
650 mg | ORAL | 0 refills | Status: DC | PRN
Start: 2024-09-09 — End: 2024-09-14
  Administered 2024-09-09 – 2024-09-13 (×5): 650 mg via ORAL

## 2024-09-09 MED ORDER — EMPAGLIFLOZIN 25 MG PO TAB
25 mg | Freq: Every day | ORAL | 0 refills | Status: DC
Start: 2024-09-09 — End: 2024-09-14
  Administered 2024-09-10 – 2024-09-13 (×4): 25 mg via ORAL

## 2024-09-09 MED ORDER — FUROSEMIDE 10 MG/ML IJ SOLN
40 mg | Freq: Once | INTRAVENOUS | 0 refills | Status: CP
Start: 2024-09-09 — End: ?
  Administered 2024-09-09: 20:00:00 40 mg via INTRAVENOUS

## 2024-09-09 MED ORDER — EZETIMIBE 10 MG PO TAB
10 mg | Freq: Every day | ORAL | 0 refills | Status: DC
Start: 2024-09-09 — End: 2024-09-14
  Administered 2024-09-10 – 2024-09-13 (×4): 10 mg via ORAL

## 2024-09-09 MED ORDER — AMITRIPTYLINE(#)/GABAPENTIN/BACLOFEN 2/6/2% TOPICAL CRM
TOPICAL | 0 refills | Status: DC
Start: 2024-09-09 — End: 2024-09-14
  Administered 2024-09-10 – 2024-09-11 (×2): via TOPICAL

## 2024-09-09 MED ORDER — PANTOPRAZOLE 40 MG PO TBEC
40 mg | Freq: Every day | ORAL | 0 refills | Status: DC
Start: 2024-09-09 — End: 2024-09-14
  Administered 2024-09-09 – 2024-09-13 (×5): 40 mg via ORAL

## 2024-09-09 MED ORDER — LIDOCAINE 5 % TP PTMD
2 | Freq: Every evening | TOPICAL | 0 refills | Status: DC
Start: 2024-09-09 — End: 2024-09-14
  Administered 2024-09-10 – 2024-09-13 (×4): 2 via TOPICAL

## 2024-09-09 MED ORDER — LEVOTHYROXINE 75 MCG PO TAB
75 ug | Freq: Every day | ORAL | 0 refills | Status: DC
Start: 2024-09-09 — End: 2024-09-14
  Administered 2024-09-10 – 2024-09-13 (×4): 75 ug via ORAL

## 2024-09-09 MED ORDER — ONDANSETRON HCL (PF) 4 MG/2 ML IJ SOLN
4 mg | INTRAVENOUS | 0 refills | Status: DC | PRN
Start: 2024-09-09 — End: 2024-09-14

## 2024-09-09 MED ORDER — BUMETANIDE 0.25 MG/ML IJ SOLN
2 mg | Freq: Two times a day (BID) | INTRAVENOUS | 0 refills | Status: DC
Start: 2024-09-09 — End: 2024-09-10
  Administered 2024-09-09 – 2024-09-10 (×2): 2 mg via INTRAVENOUS

## 2024-09-09 MED ORDER — MELATONIN 5 MG PO TAB
5 mg | Freq: Every evening | ORAL | 0 refills | Status: DC | PRN
Start: 2024-09-09 — End: 2024-09-14
  Administered 2024-09-12 – 2024-09-13 (×2): 5 mg via ORAL

## 2024-09-09 MED ORDER — SERTRALINE 50 MG PO TAB
50 mg | Freq: Every morning | ORAL | 0 refills | Status: DC
Start: 2024-09-09 — End: 2024-09-14
  Administered 2024-09-10 – 2024-09-13 (×4): 50 mg via ORAL

## 2024-09-09 MED ORDER — POTASSIUM CHLORIDE 20 MEQ PO TBTQ
60 meq | Freq: Once | ORAL | 0 refills | Status: CP
Start: 2024-09-09 — End: ?
  Administered 2024-09-09: 20:00:00 60 meq via ORAL

## 2024-09-09 MED ORDER — SENNOSIDES-DOCUSATE SODIUM 8.6-50 MG PO TAB
1 | Freq: Every day | ORAL | 0 refills | Status: DC | PRN
Start: 2024-09-09 — End: 2024-09-14

## 2024-09-09 NOTE — Consults [2]
 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  #Acute hypoxemic respiratory failure    #Atrial fibrillation, permanent  #Bicuspid aortic valve, history of severe aortic insufficiency.  Surgical AVR 2009, repeat TAVR 2022  #CAD, nonobstructive  #Elevated troponin, mild.  Consistent with acute myocardial injury.  #Hypertension  #Hyperlipidemia  #CKD 3-4  #COPD  #Hypothyroidism  #BMI 52  #History of gout    Plan:  Continue aggressive IV diuresis.  He is clearly hypervolemic.  Start IV Bumex  2 mg twice daily, will give acetazolamide  boost before this afternoon's dose.  Low threshold to pursue right heart catheterization if worsening creatinine or hypotension occurs with diuresis.  Continue PTA empagliflozin  25 mg daily, poor candidate for MRA given CKD.  Agree with continuing prior to admission metoprolol  succinate 50 mg daily  Strict I/O, K>4/Mg>2, Daily weights   Will need close outpatient cardiology follow-up in heart failure clinic on eventual discharge  Will plan to check repeat NT-proBNP prior to discharge for new baseline.    Cardiology will follow    Donnice Lent, MD   Cardiovascular Disease   _________________________________________________________________________________    Subjective:  This is a pleasant 78 year old gentleman with complex past cardiac history as above.  Recently admitted 12/2 - 08/27/2024 for AKI and melena.  EGD demonstrated erosive gastropathy without active or recent bleeding.  Apixaban  was held on admission and resumed after EGD.  Cardiology was consulted for preprocedural risk assessment, he was cleared to proceed with procedure.  Diuretics were held temporarily for AKI.  On eventual discharge he was sent home on decreased dose of torsemide  20 mg twice daily, prior to admission he was taking 40 mg twice daily.    Since discharge he reports progressive lower extremity edema, shortness of breath, weight gain.  His ER weight is documented at 312 lb, up from baseline ~290 lb. reports painful blisters developing on his leg.  Initial lab work reviewed.  NT-proBNP is elevated 1700, not far from baseline.  Mild troponin elevation, 43, 47 on repeat.  Consistent with acute myocardial injury.  Creatinine on admission 2.74.  Unclear baseline.  Recent significant fluctuations.  Baseline estimate ranging from 1.6-2.4 previously.  Hemoglobin is stable, he reports no recent episodes of melena.  ECG demonstrates atrial fibrillation with left axis deviation, borderline high heart rate 97 bpm.  Chest x-ray demonstrates cardiomegaly and pulmonary edema.  Most recent echocardiogram March 2025 with mildly reduced ejection fraction 48%, severe biatrial dilatation, well-functioning bioprosthetic TAVR valve in aortic position.  Repeat echocardiogram has been ordered for this admission.    He reports empiric antibiotics for possible cellulitis prescribed prior to admission.    He is a former Geologist, Engineering, personnel officer.  Now retired.  He has family in the state of [place] , but none locally.  He lives in an assisted living facility.  He follows with heart failure APP's and Dr. Maurice as an outpatient.  Allergies:  Hydrochlorothiazide, Niacin, and Pneumococcal vaccine  Medications:  Scheduled Meds:acetaZOLAMIDE  (DIAMOX ) tablet 500 mg, 500 mg, Oral, ONCE  apixaban  (ELIQUIS ) tablet 5 mg, 5 mg, Oral, BID  bumetanide  (  BUMEX ) injection 2 mg, 2 mg, Intravenous, BID(9-17)  [START ON 09/10/2024] empagliflozin  (JARDIANCE ) tablet 25 mg, 25 mg, Oral, QDAY  [START ON 09/10/2024] ezetimibe  (ZETIA ) tablet 10 mg, 10 mg, Oral, QDAY  [START ON 09/10/2024] levothyroxine  (SYNTHROID ) tablet 75 mcg, 75 mcg, Oral, QDAY 30 min before breakfast  [START ON 09/10/2024] metoprolol  succinate XL (TOPROL  XL) tablet 50 mg, 50 mg, Oral, QDAY  pantoprazole  DR (PROTONIX ) tablet 40 mg, 40 mg, Oral, QDAY  [START ON 09/10/2024] sertraline  (ZOLOFT ) tablet 50 mg, 50 mg, Oral, QAM8  [START ON 09/10/2024] tamsulosin  (FLOMAX ) capsule 0.4 mg, 0.4 mg, Oral, QAM8    Continuous Infusions:  PRN and Respiratory Meds:melatonin QHS PRN, ondansetron  Q6H PRN **OR** ondansetron  Q6H PRN, polyethylene glycol 3350  QDAY PRN, sennosides-docusate sodium  QDAY PRN     Physical Exam:  Vital Signs: Last Filed In 24 Hours Vital Signs: 24 Hour Range   BP: 101/71 (12/19 1541)  Temp: 37 ?C (98.6 ?F) (12/19 1541)  Pulse: 89 (12/19 1526)  Respirations: 16 PER MINUTE (12/19 1541)  SpO2: 94 % (12/19 1526)  O2 Device: Nasal cannula (12/19 1541)  O2 Liter Flow: 2 Lpm (12/19 1541) BP: (98-130)/(58-76)   Temp:  [36.4 ?C (97.6 ?F)-37 ?C (98.6 ?F)]   Pulse:  [89-110]   Respirations:  [16 PER MINUTE-28 PER MINUTE]   SpO2:  [84 %-94 %]   O2 Device: Nasal cannula  O2 Liter Flow: 2 Lpm   Intensity Pain Scale (Self Report): 8 (09/09/24 1057)    No intake or output data in the 24 hours ending 09/09/24 1552  Vitals:    09/09/24 1055   Weight: (!) 141.6 kg (312 lb 2.7 oz)     Physical Exam:    General: Well developed well nourished, ill-appearing, oxygen per nasal cannula  HEENT: Unable to assess JVD due to body habitus JVD, MMM CV: RRR no murmurs or gallops,   Chest: Coarse breath sounds bilaterally to anterior auscultation, rales noted.    Extremities: 2-3+ bilateral lower extremity edema, fluid-filled blisters noted on lower extremities.  Extremely sensitive to touch bilaterally.    Neuro: Alert and oriented grossly, hard of hearing    Donnice JAYSON Lent, MD

## 2024-09-10 ENCOUNTER — Encounter: Admit: 2024-09-10 | Discharge: 2024-09-10 | Payer: MEDICARE

## 2024-09-10 ENCOUNTER — Inpatient Hospital Stay: Admit: 2024-09-10 | Discharge: 2024-09-10 | Payer: MEDICARE

## 2024-09-10 DIAGNOSIS — R0602 Shortness of breath: Principal | ICD-10-CM

## 2024-09-10 LAB — 2D + DOPPLER ECHO
AORTIC VALVE STROKE VOLUME INDEX: 35
AV INDEX (NATIVE): 0.3
AV MEAN GRADIENT: 7 mmHg
AV PEAK GRADIENT: 17 mmHg
AV PEAK VELOCITY: 2.1 m/s
AV VALVE AREA: 2.2 cm2
BSA: 2.5 m2
DOP CALC AO VTI: 37 cm
DOP CALC LVOT AREA: 5.7 cm2
DOP CALC LVOT DIAMETER: 2.7 cm
DOP CALC LVOT PEAK VEL VTI: 14 cm
DOP CALC LVOT PEAK VEL: 0.8 m/s
DOP CALC LVOT STROKE VOLUME: 82 cm3
ECHO EF: 65 %
EJECTION FRACTION: 53 %
FRACTIONAL SHORTENING: 31 % (ref 28–44)
INTERVENTRICULAR SEPTUM: 1.2 cm (ref 0.6–1)
LEFT ATRIUM INDEX: 56 mL/m2 (ref 16–34)
LEFT ATRIUM SIZE: 6.1 cm (ref 3–4)
LEFT ATRIUM VOLUME: 143 mL (ref 18–58)
LEFT INTERNAL DIMENSION IN SYSTOLE: 3.7 cm (ref 2.5–4)
LEFT VENTRICLE DIASTOLIC VOLUME INDEX: 44 mL/m2 (ref 34–74)
LEFT VENTRICLE DIASTOLIC VOLUME: 111 mL (ref 62–150)
LEFT VENTRICLE MASS INDEX: 104 g/m2 (ref 49–115)
LEFT VENTRICLE SYSTOLIC VOLUME INDEX: 17 mL/m2 (ref 11–31)
LEFT VENTRICLE SYSTOLIC VOLUME: 43 mL (ref 21–61)
LEFT VENTRICULAR INTERNAL DIMENSION IN DIASTOLE: 5.4 cm (ref 4.2–5.8)
LEFT VENTRICULAR MASS: 264 g (ref 88–224)
POSTERIOR WALL: 1.2 cm (ref 0.6–1)
PROX AORTA: 4 cm (ref 2.6–3.8)
RELATIVE WALL THICKNESS: 0.4 (ref <=0.42)
RIGHT ATRIAL AREA: 29 cm2 (ref <18)
RIGHT HEART SYSTOLIC MMODE TAPSE: 1.6 cm (ref >1.7)
RIGHT HEART SYSTOLIC TDI S': 0.1 m/s
RIGHT VENTRICULAR BASAL DIAMETER: 4.1 cm (ref 2.5–4.1)
RIGHT VENTRICULAR MID DIAMETER: 2.4 cm (ref 1.9–3.5)
SINUS: 4 cm (ref 2.8–4)

## 2024-09-10 LAB — CBC AND DIFF
~~LOC~~ BKR HEMATOCRIT: 41 % (ref 40.0–50.0)
~~LOC~~ BKR HEMOGLOBIN: 13 g/dL (ref 13.5–16.5)
~~LOC~~ BKR MCHC: 33 g/dL (ref 32.0–36.0)
~~LOC~~ BKR MCV: 94 fL (ref 80.0–100.0)
~~LOC~~ BKR RBC COUNT: 4.3 10*6/uL — ABNORMAL LOW (ref 4.40–5.50)
~~LOC~~ BKR RDW: 16 % — ABNORMAL HIGH (ref 11.0–15.0)
~~LOC~~ BKR WBC COUNT: 8.4 10*3/uL (ref 4.50–11.00)

## 2024-09-10 LAB — FREE T4 (FREE THYROXINE) ONLY: ~~LOC~~ BKR FREE T4: 0.9 ng/dL (ref 0.6–1.6)

## 2024-09-10 LAB — HIGH SENSITIVITY TROPONIN I 4 HR
~~LOC~~ BKR HI SEN TNI DELTA 4-2: 0.6
~~LOC~~ BKR HIGH SENSITIVITY TROPONIN I 4 HOUR: 47 ng/L — ABNORMAL HIGH (ref <20.0)

## 2024-09-10 LAB — COMPREHENSIVE METABOLIC PANEL: ~~LOC~~ BKR BLD UREA NITROGEN: 85 mg/dL — ABNORMAL HIGH (ref 7–25)

## 2024-09-10 LAB — TSH WITH FREE T4 REFLEX: ~~LOC~~ BKR TSH: 6.3 [IU]/mL — ABNORMAL HIGH (ref 0.35–5.00)

## 2024-09-10 MED ORDER — SODIUM CHLORIDE 0.65 % NA SPRA
2 | NASAL | 0 refills | Status: DC | PRN
Start: 2024-09-10 — End: 2024-09-14
  Administered 2024-09-12: 15:00:00 2 via NASAL

## 2024-09-10 MED ORDER — ACETAZOLAMIDE 250 MG PO TAB
500 mg | Freq: Once | ORAL | 0 refills | Status: CP
Start: 2024-09-10 — End: ?
  Administered 2024-09-10: 23:00:00 500 mg via ORAL

## 2024-09-10 MED ORDER — BUMETANIDE 0.25 MG/ML IJ SOLN
4 mg | Freq: Two times a day (BID) | INTRAVENOUS | 0 refills | Status: DC
Start: 2024-09-10 — End: 2024-09-13
  Administered 2024-09-10 – 2024-09-11 (×3): 4 mg via INTRAVENOUS

## 2024-09-10 NOTE — Progress Notes [1]
 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     Changes made to today's assessment and plan are indicated in bold.    - BNP on admit 1706  - CXR on admit showed mild cardiomegaly w/ finding of CHF  - TTE 11/21/23 w/ EF of 48%, aortic bioprostehtic valve; severely dilated LA and RA   - repeat TTE pending  - CHF team has been consulted  - cont bumex  2mg  BID as diuresis  - GMDT: hold metoprolol  w/ lower BP readings; cont jardiance   - cont daily weights; dry weight is 295lb  - strict I/O's    Hypothyroidism  - will check TSH w/ FT4  - cont synthroid     B/l LE wounds  - wound team consulted    MDD  - cont zoloft     BPH  - cont flomax     HLD  - cont zetia     FEN: no IVF, replace lytes PRN, low Na diet  Ppx: eliquis   Code: Full    Dispo: cont inpt admit    High medical decision making due to the following:  1 or more chronic illness with severe exacerbation and 1 acute or chronic illness that poses a threat to life or bodily function  Review of notes outside of my specialty, Review of each unique test, and Ordering of each unique test    ________________________________________________________________________    Subjective  [name] is a 78 y.o. male.  Patient w/o acute events overnight.  He reports the main reason he came to the hospital was d/t wounds on his LE worsening since his lasix  dose was changed.  He reports his breathing is doing well today.     ROS: neg for fevers, chills, dyspnea, chest pain, N/V     Medications  Scheduled Meds:amitriptyline /gabapentin /baclofen (#) 2/6/2 % topical cream, , Topical, Q8H  apixaban  (ELIQUIS ) tablet 5 mg, 5 mg, Oral, BID  bumetanide  (BUMEX ) injection 2 mg, 2 mg, Intravenous, BID(9-17)  diclofenac  sodium (VOLTAREN ) 1 % topical gel 2 g, 2 g, Topical, QID  empagliflozin  (JARDIANCE ) tablet 25 mg, 25 mg, Oral, QDAY  ezetimibe  (ZETIA ) tablet 10 mg, 10 mg, Oral, QDAY  levothyroxine  (SYNTHROID ) tablet 75 mcg, 75 mcg, Oral, QDAY 30 min before breakfast  lidocaine  (LIDODERM ) 5 % topical patch 2 patch, 2 patch, Topical, QHS  metoprolol  succinate XL (TOPROL  XL) tablet 50 mg, 50 mg, Oral, QDAY  pantoprazole  DR (PROTONIX ) tablet 40 mg, 40 mg, Oral, QDAY  sertraline  (ZOLOFT ) tablet 50 mg, 50 mg, Oral, QAM8  tamsulosin  (FLOMAX ) capsule 0.4 mg, 0.4 mg, Oral, QAM8    Continuous Infusions:  PRN and Respiratory Meds:acetaminophen  Q6H PRN, melatonin QHS PRN, ondansetron  Q6H PRN **OR** ondansetron  Q6H PRN, polyethylene glycol 3350  QDAY PRN, sennosides-docusate sodium  QDAY PRN      Objective                       Vital Signs: Last Filed  Vital Signs: 24 Hour Range   BP: 93/59 (12/20 0726)  Temp: 36.7 ?C (98.1 ?F) (12/20 9273)  Pulse: 89 (12/20 0726)  Respirations: 14 PER MINUTE (12/20 0726)  SpO2: 98 % (12/20 0726)  O2 Device: None (Room air) (12/20 0726)  O2 Liter Flow: 2 Lpm (12/20 0500) BP: (93-130)/(58-88)   Temp:  [36.4 ?C (97.6 ?F)-37 ?C (98.6 ?F)]   Pulse:  [89-110]   Respirations:  [14 PER MINUTE-28 PER MINUTE]   SpO2:  [84 %-98 %]   O2 Device: None (Room air)  O2 Liter Flow: 2 Lpm   Intensity Pain Scale (Self Report): 5 (09/10/24 0835) Vitals:    09/09/24 1055 09/10/24 0900   Weight: (!) 141.6 kg (312 lb 2.7 oz) (!) 142 kg (313 lb)       Intake/Output Summary:  (Last 24 hours)    Intake/Output Summary (Last 24 hours) at 09/10/2024 1019  Last data filed at 09/10/2024 0835  Gross per 24 hour   Intake 0 ml   Output 1225 ml   Net -1225 ml           Physical Exam  General:  Alert, cooperative, no distress, appears stated age.  Psych: jovial  Head:  Normocephalic, without obvious abnormality, atraumatic.  Neck: - JVD; but body habitus makes this almost impossible to examine  Eyes:  Conjunctivae/corneas clear.   Lungs:  CTAB, no rales, no wheezing.  Heart:    Regular rate and rhythm, no MRG.  Abdomen:  Soft, non-tender.  Bowel sounds normal.    Extremities (LUE,RUE, LLE, RLE):  No cyanosis, clubbing, nor edema.  Pulses:   2+ radial pulses b/l  Skin:  weeping wound on the LLE and posterior of the RLE    Lab Review  Hematology:    Lab Results   Component Value Date    HGB 13.8 09/09/2024    HGB 13.8 09/06/2024    HCT 40.7 09/09/2024    HCT 46.6 09/06/2024    PLTCT 176 09/09/2024    PLTCT 180 09/06/2024    WBC 9.40 09/09/2024    WBC 7.96 09/06/2024    NEUT 73.8 09/09/2024    NEUT 71 03/20/2023    ANC 6.90 09/09/2024    ANC 5.66 03/20/2023    ALC 1.20 09/09/2024    ALC 1.17 03/20/2023    MONA 11.5 09/09/2024    MONA 11 03/20/2023    AMC 1.10 09/09/2024    AMC 0.86 03/20/2023    EOSA 1.3 09/09/2024    EOSA 2 03/20/2023    ABC 0.10 09/09/2024    ABC 0.06 03/20/2023    MCV 93.5 09/09/2024    MCV 100.6 09/06/2024    MCH 31.6 09/09/2024    MCH 29.8 09/06/2024    MCHC 33.8 09/09/2024    MCHC 29.5 09/06/2024    MPV 9.0 09/09/2024    MPV 10.3 09/06/2024    RDW 16.6 09/09/2024    RDW 16.3 04/21/2024   , Coagulation:    Lab Results   Component Value Date    PT 16.2 09/09/2024    PT 12.9 08/08/2021    PTT 31.7 09/09/2024    PTT 31.8 08/10/2021    INR 1.4 09/09/2024    INR 1.1 08/08/2021   , General Chemistry:    Lab Results   Component Value Date    NA 139 09/09/2024    NA 141 09/06/2024    K 3.3 09/09/2024    K 3.5 09/06/2024    CL 91  09/09/2024    CL 93 09/06/2024    CO2 35 09/09/2024    CO2 30.0 09/06/2024    GAP 13 09/09/2024    GAP 10 09/06/2024    BUN 75 09/09/2024    BUN 80.0 09/06/2024    CR 2.74 09/09/2024    CR 2.91 09/06/2024    GLU 166 09/09/2024    GLU 142 09/06/2024    GLU 88 08/27/2011    CA 9.2 09/09/2024    CA 9.3 09/06/2024    ALBUMIN 3.7 09/09/2024    ALBUMIN 3.4 04/21/2024    LACTIC 2.5 02/26/2022    OBSCA 1.14 12/30/2007 MG 1.8 09/09/2024    MG 2.2 04/21/2024    TOTBILI 0.6 09/09/2024    TOTBILI 0.96 04/21/2024    PO4 4.1 08/23/2024    PO4 2.8 03/20/2023    and Enzymes:    Lab Results   Component Value Date    AST 40 09/09/2024    AST 51 04/21/2024    ALT 19 09/09/2024    ALT 25 04/21/2024    ALKPHOS 140 09/09/2024    ALKPHOS 172 04/21/2024       Point of Care Testing  (Last 24 hours)  Glucose: (!) 166 (09/09/24 1101)    Radiology and other Diagnostics Review:    Pertinent radiology reviewed.    Herlene Charles, MD, FACP  Clinical Associate Professor  Internal Medicine, Hospital Medicine  Pager:  [phone]. Please use [employer] team first call as primary means of communication.     Note to patient: The 21st Century Cures Act makes medical notes like these available to patients in the interest of transparency. However, be advised this is a medical document. It is intended as peer to peer communication. It is written in medical language and may contain abbreviations or verbiage that are unfamiliar. It may appear blunt or direct. Medical documents are intended to carry relevant information, facts as evident, and the clinical opinion of the practitioner.

## 2024-09-10 NOTE — Procedures [3]
 Vascular Access Team consulted to obtain lab specimen.    Ultrasound Used: No    How Many Attempts: 0    Location of Unsuccessful Attempts: na    At 1030 approximately 4 ml of blood obtained from EXISTING PIV in RAC. Patient tolerated the procedure well. Specimen labeled and sent to lab.

## 2024-09-10 NOTE — Progress Notes [1]
.    [facility] JHFRAT Note    Admission Date: 09/09/2024  Length of Stay: LOS: 1 day      Fall Risk/JHFRAT Interventions and Education (charting when applicable)   a. Elimination Interventions: Commode at bedside, Place male/male urinal within reach , and Communicate timing of laxatives/diuretics with assistive personnel to support proactive elimination needs.    b. Medications: Bedside commode (i.e., urgency, frequency, dizziness) , Educate patient on medication side effects, and Bed/chair alarm (i.e., change in mental status)    c. Patient Care Equipment: Does not need assistance with patient care equipment when ambulating and Ensure environment is free of clutter and walkways are clear from tripping hazards   d. Mobility: Assist x1 and Ensure the use of corrective lens and/or hearing aides are in place prior to ambulation    e. Cognition: N/A   f. Risk for Moderate/Major Injury: Age: >65 yrs and Active Anticoagulation  \    Patient/family declined:  Bed/chair alarm and Ambulation.    Patient/family educated on importance of intervention to their safety/quality of care. Patient/family continues to decline care.    Reason Why Patient/Family declined:  Patient feels comfortable ambulating independently and is agreeable to call out for assistance if needed.    Individualized safety and/or care plan implemented. If additional safety measures implemented, please list them.     Escalated to:  Unit coordinator/charge nurse.

## 2024-09-10 NOTE — Progress Notes [1]
 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  #Acute hypoxemic respiratory failure     #Atrial fibrillation, permanent  #Bicuspid aortic valve, history of severe aortic insufficiency.  Surgical AVR 2009, repeat TAVR 2022  #CAD, nonobstructive  #Elevated troponin, mild.  Consistent with acute myocardial injury.  #Hypertension  #Hyperlipidemia  #CKD 3-4  #COPD  #Hypothyroidism  #BMI 52  #History of gout     Plan:  Continue aggressive IV diuresis.  Increase IV Bumex  4 mg twice daily, will give acetazolamide  boost once again this afternoon..  Low threshold to pursue right heart catheterization if worsening creatinine or hypotension occurs with diuresis.  Continue PTA empagliflozin  25 mg daily, not a candidate for MRA given CKD.  Agree with continuing prior to admission metoprolol  succinate 50 mg daily  Strict I/O, K>4/Mg>2, Daily weights   Will need close outpatient cardiology follow-up in heart failure clinic on eventual discharge  Will plan to check repeat NT-proBNP prior to discharge for new baseline.     Cardiology will follow     Donnice Lent, MD   Cardiovascular Disease   _________________________________________________________________________________    Subjective:    Doing well today.  States he feels improved.  Remains on oxygen.  Still without standing weight.  Bed scale unchanged.  Net -1.2 L yesterday, net negative only 750 cc today.  Creatinine stable, slightly improved.  Allergies:  Hydrochlorothiazide, Niacin, and Pneumococcal vaccine  Medications:  Scheduled Meds:amitriptyline /gabapentin /baclofen (#) 2/6/2 % topical cream, , Topical, Q8H  apixaban  (ELIQUIS ) tablet 5 mg, 5 mg, Oral, BID  bumetanide  (BUMEX ) injection 4 mg, 4 mg, Intravenous, BID(9-17)  diclofenac  sodium (VOLTAREN ) 1 % topical gel 2 g, 2 g, Topical, QID  empagliflozin  (JARDIANCE ) tablet 25 mg, 25 mg, Oral, QDAY  ezetimibe  (ZETIA ) tablet 10 mg, 10 mg, Oral, QDAY  levothyroxine  (SYNTHROID ) tablet 75 mcg, 75 mcg, Oral, QDAY 30 min before breakfast  lidocaine  (LIDODERM ) 5 % topical patch 2 patch, 2 patch, Topical, QHS  metoprolol  succinate XL (TOPROL  XL) tablet 50 mg, 50 mg, Oral, QDAY  pantoprazole  DR (PROTONIX ) tablet 40 mg, 40 mg, Oral, QDAY  sertraline  (ZOLOFT ) tablet 50 mg, 50 mg, Oral, QAM8  tamsulosin  (FLOMAX ) capsule 0.4 mg, 0.4 mg, Oral, QAM8    Continuous Infusions:  PRN and Respiratory Meds:acetaminophen  Q6H PRN, melatonin QHS PRN, ondansetron  Q6H PRN **OR** ondansetron  Q6H PRN, polyethylene glycol 3350  QDAY PRN, sennosides-docusate sodium  QDAY PRN, sodium chloride  PRN     Physical Exam:  Vital Signs: Last Filed In 24 Hours Vital Signs: 24 Hour Range   BP: 100/65 (12/20 1030)  Temp: 36.7 ?C (98 ?F) (12/20 1030)  Pulse: 88 (12/20 1030)  Respirations: 15 PER MINUTE (12/20 1030)  SpO2: 99 % (12/20 1030)  O2 Device: None (Room air) (12/20 1030)  O2 Liter Flow: 2 Lpm (12/20 0500)  Height: 165.1 cm (5' 5) (12/20 0900) BP: (93-130)/(58-88)   Temp:  [36.6 ?C (97.8 ?F)-37 ?C (98.6 ?F)]   Pulse:  [88-110]   Respirations:  [14 PER MINUTE-28 PER MINUTE]   SpO2:  [93 %-99 %]   O2 Device: None (Room air)  O2 Liter Flow: 2 Lpm   Intensity Pain Scale (Self Report): 5 (09/10/24 0835)      Intake/Output Summary (Last 24 hours) at 09/10/2024 1308  Last data filed at 09/10/2024 1152  Gross per 24 hour   Intake 0 ml   Output 1975 ml   Net -1975 ml     Vitals:    09/09/24 1055 09/10/24 0900   Weight: (!) 141.6 kg (312 lb 2.7 oz) (!) 142 kg (313 lb)     Physical Exam:    General: Well developed well nourished, ill-appearing, oxygen per nasal cannula  HEENT: Unable to assess JVD due to body habitus JVD, MMM   CV: RRR no murmurs or gallops,   Chest: Coarse breath sounds bilaterally to anterior auscultation, diminished/absent breath sounds at bases  Extremities: 2-3+ bilateral lower extremity edema, fluid-filled blisters noted on lower extremities.   Neuro: Alert and oriented grossly, hard of hearing    Donnice JAYSON Lent, MD

## 2024-09-10 NOTE — Procedures [3]
PIV consult completed; no labs

## 2024-09-10 NOTE — Progress Notes [1]
 RT Adult Assessment Note    NAME:Jacob Dickerson             MRN: [id number]             DOB:[birth year]          AGE: 78 y.o.  ADMISSION DATE: 09/09/2024             DAYS ADMITTED: LOS: 1 day    Additional Comments:  Impressions of the patient: Patient appears clinically stable.  Intervention(s)/outcome(s): Respiratory evaluation completed.      Vital Signs:  Pulse: 105  RR: 18 PER MINUTE  SpO2: 95 %  O2 Device: None (Room air)  Liter Flow:    O2%:      Breath Sounds:   All Breath Sounds: Decreased  Respiratory Effort:   Respiratory WDL: Within Defined Limits  Respiratory Effort/Pattern: SOA (Short of air) on exertion  Comments:

## 2024-09-10 NOTE — Progress Notes [1]
 Patient/family declined:  Bed/chair alarm and W/in arms reach during toileting/showering.    Patient/family educated on importance of intervention to their safety/quality of care. Patient/family continues to decline care.    Reason Why Patient/Family declined:  Pt would like to ambulate without the assistance of staff.    Individualized safety and/or care plan implemented. If additional safety measures implemented, please list them. Pt will call for help if needed.     Escalated to:  Unit coordinator/charge nurse.

## 2024-09-11 ENCOUNTER — Encounter: Admit: 2024-09-11 | Discharge: 2024-09-11 | Payer: MEDICARE

## 2024-09-11 ENCOUNTER — Inpatient Hospital Stay: Admit: 2024-09-11 | Discharge: 2024-09-10 | Payer: MEDICARE

## 2024-09-11 LAB — COMPREHENSIVE METABOLIC PANEL
~~LOC~~ BKR ALBUMIN: 3.3 g/dL — ABNORMAL LOW (ref 3.5–5.0)
~~LOC~~ BKR ALK PHOSPHATASE: 148 U/L — ABNORMAL HIGH (ref 25–110)
~~LOC~~ BKR ALT: 17 U/L (ref 7–56)
~~LOC~~ BKR AST: 31 U/L — ABNORMAL LOW (ref 7–40)
~~LOC~~ BKR CO2: 36 mmol/L — ABNORMAL HIGH (ref 21–30)

## 2024-09-11 LAB — CBC AND DIFF
~~LOC~~ BKR ABSOLUTE BASO COUNT: 0.1 10*3/uL (ref 0.00–0.20)
~~LOC~~ BKR ABSOLUTE EOS COUNT: 0.2 10*3/uL — ABNORMAL LOW (ref 0.00–0.45)
~~LOC~~ BKR ABSOLUTE MONO COUNT: 0.9 10*3/uL — ABNORMAL HIGH (ref 0.00–0.80)

## 2024-09-11 MED ORDER — POTASSIUM CHLORIDE 20 MEQ PO TBTQ
40 meq | Freq: Once | ORAL | 0 refills | Status: CP
Start: 2024-09-11 — End: ?
  Administered 2024-09-11: 20:00:00 40 meq via ORAL

## 2024-09-11 MED ORDER — POTASSIUM CHLORIDE 20 MEQ PO TBTQ
60 meq | Freq: Once | ORAL | 0 refills | Status: CP
Start: 2024-09-11 — End: ?
  Administered 2024-09-11: 17:00:00 60 meq via ORAL

## 2024-09-11 MED ORDER — POTASSIUM CHLORIDE 20 MEQ PO TBTQ
40 meq | Freq: Once | ORAL | 0 refills | Status: CP
Start: 2024-09-11 — End: ?
  Administered 2024-09-12: 02:00:00 40 meq via ORAL

## 2024-09-11 MED ORDER — MAGNESIUM SULFATE IN WATER 4 GRAM/50 ML (8 %) IV PGBK
4 g | Freq: Once | INTRAVENOUS | 0 refills | Status: CP
Start: 2024-09-11 — End: ?
  Administered 2024-09-11: 18:00:00 4 g via INTRAVENOUS

## 2024-09-11 NOTE — Progress Notes [1]
 Patient pleasant during shift.  On 1-2L NC for comfort.  No acute events.

## 2024-09-11 NOTE — Progress Notes [1]
 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     Changes made to today's assessment and plan are indicated in bold.    Acute on chronic HFpEF  - BNP on admit 1706  - CXR on admit showed mild cardiomegaly w/ finding of CHF  - TTE 11/21/23 w/ EF of 48%, aortic bioprostehtic valve; severely dilated LA and RA   - repeat TTE 12/20 w/ EF of 65%; severely dilated LA and RA, prosthetic aortic valve looks well seated  - CHF team has been consulted  - increased bumex  to 4mg  BID as diuresis + spot dosing metolazone  per cardiology  - GMDT: metoprolol , Jardiance  - cont  - cont daily weights; dry weight is 295lb  - strict I/O's    Hypothyroidism  - slightly elevated TSH w/ normal FT4  - cont synthroid  at current dose    Permanent A fib  - cont metoprolol   - cont eliquis     CKD4  - baseline Cr ~2.0-2.7; w/in baseline    B/l LE wounds  - wound team consulted    MDD  - cont zoloft     BPH  - cont flomax     HLD  - cont zetia     FEN: no IVF, replace lytes PRN, low Na diet  Ppx: eliquis   Code: Full    Dispo: cont inpt admit    High medical decision making due to the following:  1 or more chronic illness with severe exacerbation and 1 acute or chronic illness that poses a threat to life or bodily function  Review of notes outside of my specialty, Review of each unique test, and Ordering of each unique test    ________________________________________________________________________    Subjective  Jacob Dickerson is a 78 y.o. male.  Patient w/o acute events overnight.  He reports pain in his legs is improving.  Feeling his breathing is at baseline.     ROS: neg for fevers, chills, dyspnea, chest pain, N/V     Medications  Scheduled Meds:amitriptyline /gabapentin /baclofen (#) 2/6/2 % topical cream, , Topical, Q8H  apixaban  (ELIQUIS ) tablet 5 mg, 5 mg, Oral, BID  bumetanide  (BUMEX ) injection 4 mg, 4 mg, Intravenous, BID(9-17)  diclofenac  sodium (VOLTAREN ) 1 % topical gel 2 g, 2 g, Topical, QID  empagliflozin  (JARDIANCE ) tablet 25 mg, 25 mg, Oral, QDAY  ezetimibe  (ZETIA ) tablet 10 mg, 10 mg, Oral, QDAY  levothyroxine  (SYNTHROID ) tablet 75 mcg, 75 mcg, Oral, QDAY 30 min before breakfast  lidocaine  (LIDODERM ) 5 % topical patch 2 patch, 2 patch, Topical, QHS  metoprolol  succinate XL (TOPROL  XL) tablet 50 mg, 50 mg, Oral, QDAY  pantoprazole  DR (PROTONIX ) tablet 40 mg, 40 mg, Oral, QDAY  sertraline  (ZOLOFT ) tablet 50 mg, 50 mg, Oral, QAM8  tamsulosin  (FLOMAX ) capsule 0.4 mg, 0.4 mg, Oral, QAM8    Continuous Infusions:  PRN and Respiratory Meds:acetaminophen  Q6H PRN, melatonin QHS PRN, ondansetron  Q6H PRN **OR** ondansetron  Q6H PRN, polyethylene glycol 3350  QDAY PRN, sennosides-docusate sodium  QDAY PRN, sodium chloride  PRN  Objective                       Vital Signs: Last Filed                 Vital Signs: 24 Hour Range   BP: 100/48 (12/21 0607)  Temp: 36.4 ?C (97.5 ?F) (12/21 9392)  Pulse: 93 (12/21 0607)  Respirations: 20 PER MINUTE (12/21 0607)  SpO2: 92 % (12/21 0607)  O2 Device: Nasal cannula (12/21 0607)  O2 Liter Flow: 1 Lpm (12/21 0607)  Height: 165.1 cm (5' 5) (12/20 1543) BP: (96-109)/(48-75)   Temp:  [36.4 ?C (97.5 ?F)-36.8 ?C (98.2 ?F)]   Pulse:  [66-118]   Respirations:  [15 PER MINUTE-22 PER MINUTE]   SpO2:  [91 %-99 %]   O2 Device: Nasal cannula  O2 Liter Flow: 1 Lpm   Intensity Pain Scale (Self Report): 10 (09/10/24 2034) Vitals:    09/09/24 1055 09/10/24 0900 09/11/24 0259   Weight: (!) 141.6 kg (312 lb 2.7 oz) (!) 142 kg (313 lb) 134.1 kg (295 lb 9.6 oz)       Intake/Output Summary:  (Last 24 hours)    Intake/Output Summary (Last 24 hours) at 09/11/2024 0831  Last data filed at 09/11/2024 0404  Gross per 24 hour   Intake 400 ml   Output 1550 ml   Net -1150 ml           Physical Exam  General:  Alert, cooperative, no distress, appears stated age.  Psych: jovial  Head:  Normocephalic, without obvious abnormality, atraumatic.  Neck: - JVD; but body habitus makes this almost impossible to examine  Eyes:  Conjunctivae/corneas clear.   Lungs:  CTAB, no rales, no wheezing.  Heart:    Regular rate, irreg irreg rhythm, no MRG.  Abdomen:  Soft, non-tender.  Bowel sounds normal.    Extremities (LUE,RUE, LLE, RLE):  4+ b/l LE, pitting edema; TTP  Pulses:   2+ radial pulses b/l  Skin:  RLE, posterior erythematous area is the same today; LLE appears to be weeping less    Lab Review  Hematology:    Lab Results   Component Value Date    HGB 13.5 09/11/2024    HGB 13.8 09/06/2024    HCT 41.4 09/11/2024    HCT 46.6 09/06/2024    PLTCT 173 09/11/2024    PLTCT 180 09/06/2024    WBC 8.60 09/11/2024    WBC 7.96 09/06/2024    NEUT 72.5 09/11/2024    NEUT 71 03/20/2023    ANC 6.20 09/11/2024    ANC 5.66 03/20/2023    ALC 1.10 09/11/2024    ALC 1.17 03/20/2023    MONA 11.0 09/11/2024    MONA 11 03/20/2023    AMC 0.90 09/11/2024    AMC 0.86 03/20/2023    EOSA 2.3 09/11/2024    EOSA 2 03/20/2023    ABC 0.10 09/11/2024    ABC 0.06 03/20/2023    MCV 94.6 09/11/2024    MCV 100.6 09/06/2024    MCH 30.8 09/11/2024    MCH 29.8 09/06/2024    MCHC 32.5 09/11/2024    MCHC 29.5 09/06/2024    MPV 8.9 09/11/2024    MPV 10.3 09/06/2024    RDW 16.6 09/11/2024    RDW 16.3 04/21/2024   , Coagulation:    Lab Results   Component Value Date    PT 16.2 09/09/2024    PT 12.9 08/08/2021    PTT 31.7  09/09/2024    PTT 31.8 08/10/2021    INR 1.4 09/09/2024    INR 1.1 08/08/2021   , General Chemistry:    Lab Results   Component Value Date    NA 140 09/11/2024    NA 141 09/06/2024    K 3.0 09/11/2024    K 3.5 09/06/2024    CL 89 09/11/2024    CL 93 09/06/2024    CO2 36 09/11/2024    CO2 30.0 09/06/2024    GAP 15 09/11/2024 GAP 10 09/06/2024    BUN 89 09/11/2024    BUN 80.0 09/06/2024    CR 2.60 09/11/2024    CR 2.91 09/06/2024    GLU 166 09/11/2024    GLU 142 09/06/2024    GLU 88 08/27/2011    CA 8.6 09/11/2024    CA 9.3 09/06/2024    ALBUMIN 3.3 09/11/2024    ALBUMIN 3.4 04/21/2024    LACTIC 2.5 02/26/2022    OBSCA 1.14 12/30/2007    MG 1.7 09/11/2024    MG 2.2 04/21/2024    TOTBILI 0.8 09/11/2024    TOTBILI 0.96 04/21/2024    PO4 4.1 08/23/2024    PO4 2.8 03/20/2023    and Enzymes:    Lab Results   Component Value Date    AST 31 09/11/2024    AST 51 04/21/2024    ALT 17 09/11/2024    ALT 25 04/21/2024    ALKPHOS 148 09/11/2024    ALKPHOS 172 04/21/2024       Point of Care Testing  (Last 24 hours)  Glucose: (!) 166 (09/11/24 0547)    Radiology and other Diagnostics Review:    No new radiology.   Tele reviewed - A fib    Herlene Charles, MD, FACP  Clinical Associate Professor  Internal Medicine, Hospital Medicine  Pager:  [phone]. Please use [employer] team first call as primary means of communication.     Note to patient: The 21st Century Cures Act makes medical notes like these available to patients in the interest of transparency. However, be advised this is a medical document. It is intended as peer to peer communication. It is written in medical language and may contain abbreviations or verbiage that are unfamiliar. It may appear blunt or direct. Medical documents are intended to carry relevant information, facts as evident, and the clinical opinion of the practitioner.

## 2024-09-11 NOTE — Progress Notes [1]
 HC5 END OF SHIFT/PLAN OF CARE NURSING NOTE   Nursing Shift: Night Shift 1900-0700    Acute events, nursing interventions, & communication with providers: Pt ambulated several times to the bathroom to urinate. 1L NC added overnight. External cath applied d/t pt urination frequency and pain while ambulating.            Patient Goal(s)  Patient will Maintain optimal gas exchange  by the end of next shift.        Patient will  Reach and maintain goal dry weight by discharge.   Admission Weight: Weight: (!) 141.6 kg (312 lb 2.7 oz)    Last 3 Weights:   Vitals:    09/09/24 1055 09/10/24 0900 09/11/24 0259   Weight: (!) 141.6 kg (312 lb 2.7 oz) (!) 142 kg (313 lb) 134.1 kg (295 lb 9.6 oz)     Weight Change: Weight trend decreasing    Intake/Output Summary (Last 24 hours) at 09/11/2024 0618  Last data filed at 09/11/2024 0404  Gross per 24 hour   Intake 400 ml   Output 1550 ml   Net -1150 ml     Last Bowel Movement Date: 09/09/24    Fluid Restriction? No   Quality/Safety    Total Fall Risk Score: 16   Risk for Injury related to falls: Standard risk for injury based on the ABCS scoring tool  Fall Risk Category:   History of More Than One Fall Within 6 Months Before Admission: No  Elimination, Bowel and Urine: Incontinence OR urgency OR frequency  Interventions: Place male/male urinal within reach  and Bed pan available in room  Medications: On 2 or more high fall risk drugs  Interventions: Use of gait belt , Stay within arm's reach during toileting/showering (i.e., dizziness, orthostasis) , and Educate patient on medication side effects  Patient Care Equipment: One present  Interventions: Does not need assistance with patient care equipment when ambulating  Mobility: 2 - Assistance required, 2 - Unsteady gait, 2 - Visual or auditory impairment affecting mobility  Interventions: Assist x1, Use of additional staff for handling patient equipment, and Utilize walker, cane, or additional walking aid for ambulation  Cognition: 0 - No cognition issues  Interventions: N/A - Does not score as risk in this category    Other safety precautions in place: N/A    Restraints:  No      Patient Education  This RN provided education to Patient today. The following education topics were reviewed:  Quality/Safety Education:   Fall risk and Pain scale  Medication Education:   Pharmacological pain intervention  Education provided on the following medication(s): tylenol   Cardiac - Specific Education:   Heart failure management  General Education:   Mobility/Activity intolerance    The following teaching method(s) were used: Verbal  Response to learning: [employer] Understanding  Needs reinforcement on: n/a

## 2024-09-11 NOTE — Progress Notes [1]
 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  #Acute hypoxemic respiratory failure  #Lower extremity wound/blister.  Wound team consulted     #Atrial fibrillation, permanent  #Bicuspid aortic valve, history of severe aortic insufficiency.  Surgical AVR 2009, repeat TAVR 2022  #CAD, nonobstructive  #Elevated troponin, mild.  Consistent with acute myocardial injury.  #Hypertension  #Hyperlipidemia  #CKD 3-4  #COPD  #Hypothyroidism  #BMI 49  #History of gout     Plan:  Continue IV diuresis.  IV Bumex  4 mg twice daily  Continue PTA empagliflozin  25 mg daily, not a candidate for MRA given CKD.  Continue PTA metoprolol  succinate 50 mg daily  Strict I/O, K>4/Mg>2, Daily weights   Will need close outpatient cardiology follow-up in heart failure clinic on eventual discharge  Please check repeat NT-proBNP prior to discharge for new baseline.     Cardiology will follow     Jacob Lent, MD   Cardiovascular Disease   _________________________________________________________________________________    Subjective:    Doing well today.  States that lower extremity edema and breathing are improved.  Standing weight 295 lb, significantly improved from prior weight but I doubt the accuracy of prior measurement.  Based on chart review baseline weight appears close to 290 if being less conservative..  With significant prior fluctuations.  Remains on oxygen.  Nursing team reports good urine output, yesterday net -1.1 L, he has been moved to cardiology floor HC 5,  Hopefully will get more accurate I/O's moving forward.  Creatinine remains stable  Allergies:  Hydrochlorothiazide, Niacin, and Pneumococcal vaccine  Medications:  Scheduled Meds:amitriptyline /gabapentin /baclofen (#) 2/6/2 % topical cream, , Topical, Q8H  apixaban  (ELIQUIS ) tablet 5 mg, 5 mg, Oral, BID  bumetanide  (BUMEX ) injection 4 mg, 4 mg, Intravenous, BID(9-17)  diclofenac  sodium (VOLTAREN ) 1 % topical gel 2 g, 2 g, Topical, QID  empagliflozin  (JARDIANCE ) tablet 25 mg, 25 mg, Oral, QDAY  ezetimibe  (ZETIA ) tablet 10 mg, 10 mg, Oral, QDAY  levothyroxine  (SYNTHROID ) tablet 75 mcg, 75 mcg, Oral, QDAY 30 min before breakfast  lidocaine  (LIDODERM ) 5 % topical patch 2 patch, 2 patch, Topical, QHS  magnesium  sulfate   4 g/50 mL IVPB, 4 g, Intravenous, ONCE  metoprolol  succinate XL (TOPROL  XL) tablet 50 mg, 50 mg, Oral, QDAY  pantoprazole  DR (PROTONIX ) tablet 40 mg, 40 mg, Oral, QDAY  sertraline  (ZOLOFT ) tablet 50 mg, 50 mg, Oral, QAM8  tamsulosin  (FLOMAX ) capsule 0.4 mg, 0.4 mg, Oral, QAM8    Continuous Infusions:  PRN and Respiratory Meds:acetaminophen  Q6H PRN, melatonin QHS PRN, ondansetron  Q6H PRN **OR** ondansetron  Q6H PRN, polyethylene glycol 3350  QDAY PRN, sennosides-docusate sodium  QDAY PRN, sodium chloride  PRN     Physical Exam:  Vital Signs: Last Filed In 24 Hours Vital Signs: 24 Hour Range   BP: 103/63 (12/21 1557)  Temp: 36.6 ?C (97.9 ?F) (12/21 1557)  Pulse: 96 (12/21 1557)  Respirations: 17 PER MINUTE (12/21 1557)  SpO2: 95 % (12/21 1557)  O2 Device: None (Room air) (12/21 1557)  O2 Liter Flow: 2 Lpm (12/21 1155) BP: (93-109)/(48-63)   Temp:  [36.3 ?C (97.4 ?F)-36.8 ?  C (98.2 ?F)]   Pulse:  [66-118]   Respirations:  [17 PER MINUTE-22 PER MINUTE]   SpO2:  [91 %-95 %]   O2 Device: None (Room air)  O2 Liter Flow: 2 Lpm   Intensity Pain Scale (Self Report): 10 (09/10/24 2034)      Intake/Output Summary (Last 24 hours) at 09/11/2024 1619  Last data filed at 09/11/2024 1600  Gross per 24 hour   Intake 1420 ml   Output 1200 ml   Net 220 ml     Vitals:    09/09/24 1055 09/10/24 0900 09/11/24 0259   Weight: (!) 141.6 kg (312 lb 2.7 oz) (!) 142 kg (313 lb) 134.1 kg (295 lb 9.6 oz)     Physical Exam:    General: Well developed well nourished, ill-appearing, oxygen per nasal cannula  HEENT: Unable to assess JVD due to body habitus JVD, MMM   CV: RRR no murmurs or gallops,   Chest: Coarse breath sounds bilaterally to anterior auscultation, diminished/absent breath sounds at bases  Extremities: 2+  bilateral lower extremity edema, fluid-filled blisters noted on lower extremities.   Neuro: Alert and oriented grossly, hard of hearing    Jacob JAYSON Lent, MD

## 2024-09-12 MED ORDER — GABAPENTIN 300 MG PO CAP
300 mg | Freq: Two times a day (BID) | ORAL | 0 refills | Status: DC
Start: 2024-09-12 — End: 2024-09-14
  Administered 2024-09-13 (×2): 300 mg via ORAL

## 2024-09-12 MED ORDER — TORSEMIDE 20 MG PO TAB
40 mg | Freq: Two times a day (BID) | ORAL | 0 refills | Status: DC
Start: 2024-09-12 — End: 2024-09-14
  Administered 2024-09-13: 15:00:00 40 mg via ORAL

## 2024-09-12 MED ORDER — POTASSIUM CHLORIDE 20 MEQ PO TBTQ
60 meq | Freq: Once | ORAL | 0 refills | Status: CP
Start: 2024-09-12 — End: ?
  Administered 2024-09-12: 15:00:00 60 meq via ORAL

## 2024-09-12 MED ORDER — DIVALPROEX 500 MG PO TB24
500 mg | Freq: Every evening | ORAL | 0 refills | Status: DC
Start: 2024-09-12 — End: 2024-09-14
  Administered 2024-09-13: 04:00:00 500 mg via ORAL

## 2024-09-12 NOTE — Progress Notes [1]
 HC5 END OF SHIFT/PLAN OF CARE NURSING NOTE   Nursing Shift: Day Shift 0700-1900    Acute events, nursing interventions, & communication with providers:  Has been in chair most of day.  Amb with assistance to bathroom Gait slow and steady.  slt dyspnea with activity.        Patient Goal(s)  Patient will Reach and maintain goal dry weight by the end of next shift.        Patient will  Verbalize readiness for discharge by discharge.   Admission Weight: Weight: (!) 141.6 kg (312 lb 2.7 oz)    Last 3 Weights:   Vitals:    09/10/24 0900 09/11/24 0259 09/12/24 0206   Weight: (!) 142 kg (313 lb) 134.1 kg (295 lb 9.6 oz) 132 kg (291 lb)     Weight Change: Weight trend decreasing    Intake/Output Summary (Last 24 hours) at 09/12/2024 1754  Last data filed at 09/12/2024 1714  Gross per 24 hour   Intake 700 ml   Output 2851 ml   Net -2151 ml     Last Bowel Movement Date: 09/10/24    Fluid Restriction? No   Quality/Safety    Total Fall Risk Score: 9   Risk for Injury related to falls: Standard risk for injury based on the ABCS scoring tool  Fall Risk Category:   History of More Than One Fall Within 6 Months Before Admission: No  Elimination, Bowel and Urine: N/A  Interventions: N/A - Does not score for risk in this category and Place male/male urinal within reach   Medications: On 2 or more high fall risk drugs  Interventions: Educate patient on medication side effects  Patient Care Equipment: N/A  Interventions: N/A - Does not score as risk in this category  Mobility: 2 - Assistance required  Interventions: Assist x1 and Utilize walker, cane, or additional walking aid for ambulation  Cognition: 0 - No cognition issues  Interventions: N/A - Does not score as risk in this category    Other safety precautions in place: N/A    Restraints:  No      Patient Education  This RN provided education to Patient today. The following education topics were reviewed:  Quality/Safety Education:   Fall risk  Medication Education:   Cardiac medication(s)  Education provided on the following medication(s):  change to oral diuertic  Cardiac - Specific Education:   Heart failure management  General Education:   Diet/nutrition and Discharge planning    The following teaching method(s) were used: Verbal  Response to learning: Some Evidence of Learning, Needs Reinforcement  Needs reinforcement on:  Reinforce all teaching. Encourage patient t ask questions

## 2024-09-12 NOTE — Progress Notes [1]
 HC5 END OF SHIFT/PLAN OF CARE NURSING NOTE   Nursing Shift: Night Shift 1900-0700    Acute events, nursing interventions, & communication with providers: Pt on 3 L NC during sleep, had some soft pressure over night while resting but denies any symptoms.           Patient Goal(s)  Patient will Report breathing has improved by the end of next shift.        Patient will  Reach and maintain goal dry weight by discharge.   Admission Weight: Weight: (!) 141.6 kg (312 lb 2.7 oz)    Last 3 Weights:   Vitals:    09/10/24 0900 09/11/24 0259 09/12/24 0206   Weight: (!) 142 kg (313 lb) 134.1 kg (295 lb 9.6 oz) 132 kg (291 lb)     Weight Change: Weight trend decreasing    Intake/Output Summary (Last 24 hours) at 09/12/2024 0552  Last data filed at 09/12/2024 0205  Gross per 24 hour   Intake 1020 ml   Output 1825 ml   Net -805 ml     Last Bowel Movement Date: 09/10/24    Fluid Restriction? No   Quality/Safety    Total Fall Risk Score: 12   Risk for Injury related to falls: Standard risk for injury based on the ABCS scoring tool  Fall Risk Category:   History of More Than One Fall Within 6 Months Before Admission: No  Elimination, Bowel and Urine: Incontinence OR urgency OR frequency  Interventions: Place male/male urinal within reach  and Bed pan available in room  Medications: On 2 or more high fall risk drugs  Interventions: Use of gait belt , Stay within arm's reach during toileting/showering (i.e., dizziness, orthostasis) , and Educate patient on medication side effects  Patient Care Equipment: One present  Interventions: Does not need assistance with patient care equipment when ambulating  Mobility: 2 - Assistance required  Interventions: Assist x1, Use of additional staff for handling patient equipment, and Utilize walker, cane, or additional walking aid for ambulation  Cognition: 0 - No cognition issues  Interventions: N/A - Does not score as risk in this category    Other safety precautions in place: N/A    Restraints: No      Patient Education  This RN provided education to Patient today. The following education topics were reviewed:  Quality/Safety Education:   Fall risk and Pain scale  Medication Education:   Pharmacological pain intervention  Education provided on the following medication(s): all meds given  Cardiac - Specific Education:   Heart failure management  General Education:   Mobility/Activity intolerance    The following teaching method(s) were used: Verbal  Response to learning: [employer] Understanding  Needs reinforcement on: n/a

## 2024-09-12 NOTE — Progress Notes [1]
 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     Changes made to today's assessment and plan are indicated in bold.    Acute on chronic HFpEF  - BNP on admit 1706  - CXR on admit showed mild cardiomegaly w/ finding of CHF  - TTE 11/21/23 w/ EF of 48%, aortic bioprostehtic valve; severely dilated LA and RA   - repeat TTE 12/20 w/ EF of 65%; severely dilated LA and RA, prosthetic aortic valve looks well seated  - CHF team has been consulted  - holding on further bumex  per cardiology  - GMDT: metoprolol , Jardiance  - cont  - cont daily weights; dry weight is 295lb  - strict I/O's  - replace K    Hypothyroidism  - slightly elevated TSH w/ normal FT4  - cont synthroid  at current dose    Permanent A fib  - cont metoprolol   - cont eliquis     CKD4  - baseline Cr ~2.0-2.7; w/in baseline    B/l LE wounds  - wound team consulted    MDD  - cont zoloft     BPH  - cont flomax     HLD  - cont zetia     Active Wounds                            Wounds Blister (Not pressure) Anterior;Left Leg (Active)   09/10/24 0045   Wound Type: Blister (Not pressure)   Orientation: Anterior;Left   Location: Leg   Wound Location Comments:    Initial Wound Site Closure:    Initial Dressing Placed:    Initial Cycle:    Initial Suction Setting (mmHg):    Pressure Injury Stages:    Pressure Injury Present Within 24 Hours of Hospital Admission:    If This Pressure Injury Is Suspected to Be Device Related, Please Select the Device::    Is the Wound Open or Closed:    Image   09/12/24 1200   Wound Assessment Moist;Red 09/12/24 1200   Peri-wound Assessment Dry;Intact 09/12/24 1200   Wound Drainage Amount Scant 09/12/24 1200   Wound Drainage Description Serous 09/12/24 1200   Wound Dressing Status Intact 09/12/24 1200   Wound Care Dressing changed or new application 09/12/24 1200   Wound Dressing and/or Treatment Silicone silver contact layer;Abdominal pad;Kerlix 09/12/24 1200   Dressing Change Due 09/15/24 09/12/24 1200   Wound Status (Wound Team Only) Being Treated 09/12/24 1200   Wound Length (cm) (Wound Team Only) 8.2 cm 09/12/24 1200   Wound Width (cm) (Wound Team Only) 10.5 cm 09/12/24 1200   Wound Depth (cm) (Wound Team Only) 0.1 cm 09/12/24 1200   Wound Surface Area (cm^2) (Wound Team Only) 67.62 cm^2 09/12/24 1200   Wound Volume (cm^3) (Wound Team Only) 4.508 cm^3 09/12/24 1200   Number of days: 2       Wounds Blister (Not pressure) Anterior;Right;Lower Leg (Active)   09/10/24 1900   Wound Type: Blister (  Not pressure)   Orientation: Anterior;Right;Lower   Location: Leg   Wound Location Comments: 2 on right leg   Initial Wound Site Closure:    Initial Dressing Placed:    Initial Cycle:    Initial Suction Setting (mmHg):    Pressure Injury Stages:    Pressure Injury Present Within 24 Hours of Hospital Admission:    If This Pressure Injury Is Suspected to Be Device Related, Please Select the Device::    Is the Wound Open or Closed:    Wound Assessment Dry;Pale;Pink 09/12/24 1200   Peri-wound Assessment Dry;Intact 09/12/24 1200   Wound Drainage Amount None 09/12/24 1200   Wound Dressing Status None/open to air 09/12/24 1200   Wound Care Dressing changed or new application 09/10/24 1813   Wound Dressing and/or Treatment Foam 09/12/24 0820   Number of days: 2                              FEN: no IVF, replace lytes PRN, low Na diet  Ppx: eliquis   Code: Full    Dispo: cont inpt admit    High medical decision making due to the following:  1 or more chronic illness with severe exacerbation and 1 acute or chronic illness that poses a threat to life or bodily function  Review of notes outside of my specialty, Review of each unique test, and Ordering of each unique test    ________________________________________________________________________    Subjective  Jacob Dickerson is a 78 y.o. male.  Patient w/o acute events overnight.  He reports pain in his L leg is the same.  Feels his breathing is normal now.  Is ok staying in the hospital longer.     ROS: neg for fevers, chills, dyspnea, chest pain, N/V     Medications  Scheduled Meds:amitriptyline /gabapentin /baclofen (#) 2/6/2 % topical cream, , Topical, Q8H  apixaban  (ELIQUIS ) tablet 5 mg, 5 mg, Oral, BID  [Held by Provider] bumetanide  (BUMEX ) injection 4 mg, 4 mg, Intravenous, BID(9-17)  diclofenac  sodium (VOLTAREN ) 1 % topical gel 2 g, 2 g, Topical, QID  empagliflozin  (JARDIANCE ) tablet 25 mg, 25 mg, Oral, QDAY  ezetimibe  (ZETIA ) tablet 10 mg, 10 mg, Oral, QDAY  levothyroxine  (SYNTHROID ) tablet 75 mcg, 75 mcg, Oral, QDAY 30 min before breakfast  lidocaine  (LIDODERM ) 5 % topical patch 2 patch, 2 patch, Topical, QHS  metoprolol  succinate XL (TOPROL  XL) tablet 50 mg, 50 mg, Oral, QDAY  pantoprazole  DR (PROTONIX ) tablet 40 mg, 40 mg, Oral, QDAY  sertraline  (ZOLOFT ) tablet 50 mg, 50 mg, Oral, QAM8  tamsulosin  (FLOMAX ) capsule 0.4 mg, 0.4 mg, Oral, QAM8    Continuous Infusions:  PRN and Respiratory Meds:acetaminophen  Q6H PRN, melatonin QHS PRN, ondansetron  Q6H PRN **OR** ondansetron  Q6H PRN, polyethylene glycol 3350  QDAY PRN, sennosides-docusate sodium  QDAY PRN, sodium chloride  PRN      Objective                       Vital Signs: Last Filed                 Vital Signs: 24 Hour Range   BP: 90/51 (12/22 0817)  Temp: 36.7 ?C (98 ?F) (12/22 0815)  Pulse: 80 (12/22 0815)  Respirations: 18 PER MINUTE (12/22 0815)  SpO2: 93 % (12/22 0815)  O2 Device: Nasal cannula (12/22 0815)  O2 Liter Flow: 3 Lpm (12/22 0815) BP: (83-112)/(46-70)   Temp:  [36.6 ?C (97.8 ?F)-36.9 ?  C (98.4 ?F)]   Pulse:  [80-105]   Respirations:  [16 PER MINUTE-19 PER MINUTE]   SpO2:  [92 %-95 %]   O2 Device: Nasal cannula  O2 Liter Flow: 3 Lpm   Intensity Pain Scale (Self Report): 3 (09/11/24 2030) Vitals:    09/10/24 0900 09/11/24 0259 09/12/24 0206   Weight: (!) 142 kg (313 lb) 134.1 kg (295 lb 9.6 oz) 132 kg (291 lb)       Intake/Output Summary:  (Last 24 hours)    Intake/Output Summary (Last 24 hours) at 09/12/2024 9066  Last data filed at 09/12/2024 9295  Gross per 24 hour   Intake 870 ml   Output 2225 ml   Net -1355 ml           Physical Exam  General:  Alert, cooperative, no distress, appears stated age.  Psych: jovial  Head:  Normocephalic, without obvious abnormality, atraumatic.  Neck: - JVD; but body habitus makes this almost impossible to examine  Eyes:  Conjunctivae/corneas clear.   Lungs:  CTAB, no rales, no wheezing.  Heart:    Regular rate, irreg irreg rhythm, no MRG.  Abdomen:  Soft, non-tender.  Bowel sounds normal.    Extremities (LUE,RUE, LLE, RLE):  4+ b/l LE, pitting edema; TTP  Pulses:   2+ radial pulses b/l  Skin:  RLE, posterior erythematous area is the same today; LLE blister opened now.    Lab Review  Hematology:    Lab Results   Component Value Date    HGB 14.0 09/12/2024    HGB 13.8 09/06/2024    HCT 41.4 09/12/2024    HCT 46.6 09/06/2024    PLTCT 175 09/12/2024    PLTCT 180 09/06/2024    WBC 8.60 09/12/2024    WBC 7.96 09/06/2024    NEUT 71.3 09/12/2024    NEUT 71 03/20/2023    ANC 6.20 09/12/2024    ANC 5.66 03/20/2023    ALC 1.10 09/12/2024    ALC 1.17 03/20/2023    MONA 13.1 09/12/2024    MONA 11 03/20/2023    AMC 1.10 09/12/2024    AMC 0.86 03/20/2023    EOSA 2.7 09/12/2024    EOSA 2 03/20/2023    ABC 0.00 09/12/2024    ABC 0.06 03/20/2023    MCV 93.8 09/12/2024    MCV 100.6 09/06/2024    MCH 31.6 09/12/2024    MCH 29.8 09/06/2024    MCHC 33.7 09/12/2024    MCHC 29.5 09/06/2024    MPV 8.9 09/12/2024    MPV 10.3 09/06/2024    RDW 16.3 09/12/2024    RDW 16.3 04/21/2024   , Coagulation:    Lab Results   Component Value Date    PT 16.2 09/09/2024    PT 12.9 08/08/2021    PTT 31.7 09/09/2024    PTT 31.8 08/10/2021    INR 1.4 09/09/2024    INR 1.1 08/08/2021   , General Chemistry:    Lab Results   Component Value Date    NA 140 09/12/2024    NA 141 09/06/2024    K 3.5 09/12/2024    K 3.5 09/06/2024    CL 91 09/12/2024    CL 93 09/06/2024    CO2 38 09/12/2024    CO2 30.0 09/06/2024    GAP 11 09/12/2024    GAP 10 09/06/2024    BUN 92 09/12/2024    BUN 80.0 09/06/2024    CR 2.82 09/12/2024    CR 2.91 09/06/2024  GLU 142 09/12/2024    GLU 142 09/06/2024    GLU 88 08/27/2011    CA 9.0 09/12/2024    CA 9.3 09/06/2024    ALBUMIN 3.5 09/12/2024    ALBUMIN 3.4 04/21/2024    LACTIC 2.5 02/26/2022    OBSCA 1.14 12/30/2007    MG 2.1 09/12/2024    MG 2.2 04/21/2024    TOTBILI 0.8 09/12/2024    TOTBILI 0.96 04/21/2024    PO4 4.1 08/23/2024    PO4 2.8 03/20/2023    and Enzymes:    Lab Results   Component Value Date    AST 39 09/12/2024    AST 51 04/21/2024    ALT 19 09/12/2024    ALT 25 04/21/2024    ALKPHOS 138 09/12/2024    ALKPHOS 172 04/21/2024       Point of Care Testing  (Last 24 hours)  Glucose: (!) 142 (09/12/24 0427)    Radiology and other Diagnostics Review:    No new radiology.   Tele reviewed - A fib    Herlene Charles, MD, FACP  Clinical Associate Professor  Internal Medicine, Hospital Medicine  Pager:  [phone]. Please use [employer] team first call as primary means of communication.     Note to patient: The 21st Century Cures Act makes medical notes like these available to patients in the interest of transparency. However, be advised this is a medical document. It is intended as peer to peer communication. It is written in medical language and may contain abbreviations or verbiage that are unfamiliar. It may appear blunt or direct. Medical documents are intended to carry relevant information, facts as evident, and the clinical opinion of the practitioner.

## 2024-09-12 NOTE — Progress Notes [1]
 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  #Acute hypoxemic respiratory failure  #Lower extremity wound/blister.  Wound team consulted     #Atrial fibrillation, permanent  #Bicuspid aortic valve, history of severe aortic insufficiency.  Surgical AVR 2009, repeat TAVR 2022  #CAD, nonobstructive  #Elevated troponin, mild.  Consistent with acute myocardial injury.  #Hypertension  #Hyperlipidemia  #CKD 3-4  #COPD  #Hypothyroidism  #BMI 49  #History of gout     Plan:  Transition to p.o. diuretics 12/23.  Torsemide  40 mg twice daily.  Metolazone  5 mg daily as needed   Continue PTA empagliflozin  25 mg daily, not a candidate for MRA given CKD.  Continue PTA metoprolol  succinate 50 mg daily  Strict I/O, K>4/Mg>2, Daily weights   Will need close outpatient cardiology follow-up in heart failure clinic on eventual discharge (requested)  NT-proBNP ordered 12/23 for new baseline     Cardiology will follow     Donnice Lent, MD   Cardiovascular Disease   _________________________________________________________________________________    Subjective:    Doing well today.  Weaned off of oxygen.  Weight is down to 291 pounds, at dry weight.  I's/O's have not been accurate.  He states that he feels much better.  Slight uptrend in creatinine.  A very difficult point-of-care ultrasound demonstrated small IVC. Will hold IV diuresis and transition to p.o. diuretic regimen 12/23.  Allergies:  Hydrochlorothiazide, Niacin, and Pneumococcal vaccine  Medications:  Scheduled Meds:amitriptyline /gabapentin /baclofen (#) 2/6/2 % topical cream, , Topical, Q8H  apixaban  (ELIQUIS ) tablet 5 mg, 5 mg, Oral, BID  [Held by Provider] bumetanide  (BUMEX ) injection 4 mg, 4 mg, Intravenous, BID(9-17)  diclofenac  sodium (VOLTAREN ) 1 % topical gel 2 g, 2 g, Topical, QID  divalproex  (DEPAKOTE  ER) ER tablet 500 mg, 500 mg, Oral, QHS  empagliflozin  (JARDIANCE ) tablet 25 mg, 25 mg, Oral, QDAY  ezetimibe  (ZETIA ) tablet 10 mg, 10 mg, Oral, QDAY  gabapentin  (NEURONTIN ) capsule 300 mg, 300 mg, Oral, BID  levothyroxine  (SYNTHROID ) tablet 75 mcg, 75 mcg, Oral, QDAY 30 min before breakfast  lidocaine  (LIDODERM ) 5 % topical patch 2 patch, 2 patch, Topical, QHS  metoprolol  succinate XL (TOPROL  XL) tablet 50 mg, 50 mg, Oral, QDAY  pantoprazole  DR (PROTONIX ) tablet 40 mg, 40 mg, Oral, QDAY  sertraline  (ZOLOFT ) tablet 50 mg, 50 mg, Oral, QAM8  tamsulosin  (FLOMAX ) capsule 0.4 mg, 0.4 mg, Oral, QAM8    Continuous Infusions:  PRN and Respiratory Meds:acetaminophen  Q6H PRN, melatonin QHS PRN, ondansetron  Q6H PRN **OR** ondansetron  Q6H PRN, polyethylene glycol 3350  QDAY PRN, sennosides-docusate sodium  QDAY PRN, sodium chloride  PRN     Physical Exam:  Vital Signs: Last Filed In 24 Hours Vital Signs: 24 Hour Range   BP: 99/61 (12/22 1611)  Temp: 36.4 ?C (97.6 ?F) (12/22 1611)  Pulse: 99 (12/22 1611)  Respirations: 17 PER MINUTE (12/22 1611)  SpO2: 93 % (12/22 1611)  O2 Device: Nasal cannula (12/22 1611)  O2 Liter Flow: 3 Lpm (12/22 1611) BP: (83-112)/(46-70)   Temp:  [36.3 ?C (97.4 ?F)-36.9 ?C (98.4 ?F)]   Pulse:  [80-105]   Respirations:  [16 PER  MINUTE-19 PER MINUTE]   SpO2:  [91 %-93 %]   O2 Device: Nasal cannula  O2 Liter Flow: 3 Lpm   Intensity Pain Scale (Self Report): 3 (09/11/24 2030)      Intake/Output Summary (Last 24 hours) at 09/12/2024 1721  Last data filed at 09/12/2024 1714  Gross per 24 hour   Intake 700 ml   Output 2851 ml   Net -2151 ml     Vitals:    09/10/24 0900 09/11/24 0259 09/12/24 0206   Weight: (!) 142 kg (313 lb) 134.1 kg (295 lb 9.6 oz) 132 kg (291 lb)     Physical Exam:    General: Well developed well nourished, ill-appearing, room air  HEENT: Unable to assess JVD due to body habitus JVD, MMM   CV: RRR no murmurs or gallops,   Chest: CTAB  Extremities: Trace-  1+  bilateral lower extremity edema, fluid-filled blisters noted on lower extremities.   Neuro: Alert and oriented grossly, hard of hearing    Donnice JAYSON Lent, MD

## 2024-09-12 NOTE — Consults [2]
 Wound Ostomy Note    NAME:Jacob Dickerson                                                                   MRN: [id number]                 DOB:26-Apr-1946          AGE: 78 y.o.  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  Patient mentions that on his last admission 3 weeks ago for GI bleed his Lasix  dose was decreased and that is why he is having fluid overload.  Pt denies SOA or CP. Pt reports increased pain in left leg.     Pt agreeable to left lower leg assessment. Pt describes large blister formation on leg; he notes that he tried to rest his legs so that it would cause blister to spontaneously rupture. This did occur at some point over weekend and nursing staff dressed wound with Aquacel Ag, abdominal pad and kerlix.     Dressing removed for assessment. Large area of pink/red discoloration on left medial lower leg with circular shape; presentation aligns with described blister. Blister has ruptured and ~80% of epithelial layer is now adhered over wound surface. Remaining 20%, where epithelial layer has peeled away, exposes moist, red wound base. Scant serous drainage present. Aquacel Ag noted to have adhered to wound bed and was painful during removal. Discussed plan to continue with a silver-based dressing but one that is non-adherent to help decrease discomfort and trauma to wound bed during dressing changes. Pt agreeable to new dressing recommendation.    Site cleaned with saline and gauze then dressed with Urogotul Silver silicone contact layer, abdominal pad and kerlix. Dressing can be changed q72 hrs by bedside nursing staff.     Small bordered foam dressing removed from right lower posterior leg. Dry, pale pink skin with circular pattern present and appears to be resolved blister. No drainage. No dressing needed.    PLAN:  Left lower leg -   Clean wound with saline and gauze. Cover with layer of Urgotul Silver silicone contact layer followed by abdominal pad. Secure with kerlix gauze wrap. Bedside RN to change q72 hrs.     --Wound care and maintenance orders placed per skin integrity protocol. Call Stat Desk for dressing supplies if not found on unit supply cart. Bedside RN is responsible for dressing changes. --    Wounds Blister (Not pressure) Anterior;Left Leg (Active)   09/10/24 0045   Wound Type: Blister (Not pressure)   Orientation: Anterior;Left   Location: Leg   Wound Location Comments:    Initial Wound Site Closure:    Initial Dressing Placed:    Initial Cycle:    Initial Suction Setting (mmHg):    Pressure Injury Stages:  Pressure Injury Present Within 24 Hours of Hospital Admission:    If This Pressure Injury Is Suspected to Be Device Related, Please Select the Device::    Is the Wound Open or Closed:    Image   09/12/24 1200   Wound Assessment Moist;Red 09/12/24 1200   Peri-wound Assessment Dry;Intact 09/12/24 1200   Wound Drainage Amount Scant 09/12/24 1200   Wound Drainage Description Serous 09/12/24 1200   Wound Dressing Status Intact 09/12/24 1200   Wound Care Dressing changed or new application 09/12/24 1200   Wound Dressing and/or Treatment Silicone silver contact layer;Abdominal pad;Kerlix 09/12/24 1200   Dressing Change Due 09/15/24 09/12/24 1200   Wound Status (Wound Team Only) Being Treated 09/12/24 1200   Wound Length (cm) (Wound Team Only) 8.2 cm 09/12/24 1200   Wound Width (cm) (Wound Team Only) 10.5 cm 09/12/24 1200   Wound Depth (cm) (Wound Team Only) 0.1 cm 09/12/24 1200   Wound Surface Area (cm^2) (Wound Team Only) 67.62 cm^2 09/12/24 1200   Wound Volume (cm^3) (Wound Team Only) 4.508 cm^3 09/12/24 1200   Number of days: 2     Bernarda Hutchinson, RN, BSN, [employer]  Wound/Ostomy Nursing Consult Service  Available via [place] text when in house  For questions after 4:00pm M-F/weekends/holidays, voalte ?[facility] Wound/Ostomy First Contact?

## 2024-09-13 ENCOUNTER — Encounter: Admit: 2024-09-13 | Discharge: 2024-09-13 | Payer: MEDICARE

## 2024-09-13 MED ORDER — METOLAZONE 5 MG PO TAB
5 mg | ORAL_TABLET | Freq: Every day | ORAL | 2 refills | 84.00000 days | Status: DC | PRN
Start: 2024-09-13 — End: 2024-09-13
  Filled 2024-09-13: 30d supply, fill #0

## 2024-09-13 MED ORDER — METOLAZONE 5 MG PO TAB
5 mg | ORAL_TABLET | Freq: Every day | ORAL | 2 refills | 84.00000 days | Status: AC | PRN
Start: 2024-09-13 — End: ?

## 2024-09-13 MED ORDER — TORSEMIDE 20 MG PO TAB
40 mg | ORAL_TABLET | Freq: Two times a day (BID) | ORAL | 1 refills | 67.50000 days | Status: AC
Start: 2024-09-13 — End: ?
  Filled 2024-09-13: 30d supply, fill #0

## 2024-09-13 MED ORDER — TORSEMIDE 20 MG PO TAB
40 mg | ORAL_TABLET | Freq: Two times a day (BID) | ORAL | 1 refills | 67.50000 days | Status: DC
Start: 2024-09-13 — End: 2024-09-13

## 2024-09-13 NOTE — Progress Notes [1]
 Have reviewed discharge orders with patient. When discussing weighing daily patient reports he weighs himself daily.  Showed pt zone sheet and to keep track on the sheet and to bring his weight log to his appointments.  Provided dressing materials for wound on left leg.  Did speak with Larraine, the NP from [facility] and provided updates.

## 2024-09-13 NOTE — Progress Notes [1]
 Medicaid transport has not arrived.  [facility] RN called and was told it would be another hour.

## 2024-09-13 NOTE — Progress Notes [1]
 Medicaid transport did arrive.  An was a wheel chair fleeta but driver did not have a wheelchair. Patient was able to walk up ramp to back seat of van.  All belongings with patient.
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
## 2024-09-13 NOTE — Care Plan [600008]
 Problem: Discharge Planning  Goal: Participation in plan of care  Outcome: Goal Achieved  Flowsheets (Taken 09/13/2024 1814)  Participation in Plan of Care: Involve patient/caregiver in care planning decision making  Note: Discharged to assisted living facility  Goal: Knowledge regarding plan of care  Outcome: Goal Achieved  Flowsheets (Taken 09/13/2024 1814)  Knowledge regarding plan of care: Provide infection prevention education  Goal: Prepared for discharge  Outcome: Goal Achieved  Flowsheets (Taken 09/13/2024 1814)  Prepared for discharge:   Provide discharge activity restrictions education   Provide discharge materials appropriate to patient condition  Note: Discharged to facility     Problem: High Fall Risk  Goal: High Fall Risk  Outcome: Goal Achieved  Flowsheets (Taken 09/13/2024 1814)  High Fall Risk: All patients will receive: High fall risk sign, yellow wristband, yellow socks, gait belt, and shower shoes  Note: No falls

## 2024-09-13 NOTE — Case Mgmt DC Plan [600024]
 Medicaid Transportation Summary      Requested By:  Alverna Rase, RNCM  Date:  12/23  Type: Ambulatory   Destination:  [address] [place], [place] [address]   Transportation Arrival Window:  14:05-14:40  Transportation to contact the unit 15 min. prior to arrival: Yes, Unit phone # [phone]  Medicaid Transportation Coordinator: [employer] at [phone]   Representative:   Warren Collins Reservation Number:  [id number]  CMA updated RNCM    If patient moves to the discharge lounge or if the RN has not heard from anyone by 15:30 call [employer] at [phone] for an update.     Larraine Brought  Case Management Assistant  For additional assistance please contact RNCM

## 2024-09-13 NOTE — Progress Notes [1]
 Spoke to Knowles with the [employer].  Levorn commented the patent would be picked up by [employer]. I explained to Levorn a wheel chair fleeta was requested.  Levorn looked at the notes on her end and confirmed a wheelchair fleeta was requested.  Levorn spoke to the driver who said they were 2 minutes out.   Explained to Levorn they will need to bring a wheelchair to the room to get the patient.

## 2024-09-13 NOTE — Progress Notes [1]
 [facility] END OF SHIFT/PLAN OF CARE NURSING NOTE   Nursing Shift: Day Shift 0700-1900    Acute events, nursing interventions, & communication with providers:  Amb in hall Patient discharged to facility.       Patient Goal(s)  Patient will Verbalize readiness for discharge by the end of next shift.        Patient will  Verbalize readiness for discharge by discharge.   Admission Weight: Weight: (!) 141.6 kg (312 lb 2.7 oz)    Last 3 Weights:   Vitals:    09/11/24 0259 09/12/24 0206 09/13/24 0400   Weight: 134.1 kg (295 lb 9.6 oz) 132 kg (291 lb) 130.6 kg (288 lb)     Weight Change: Weight trend decreasing    Intake/Output Summary (Last 24 hours) at 09/13/2024 1809  Last data filed at 09/13/2024 1700  Gross per 24 hour   Intake 1020 ml   Output 1050 ml   Net -30 ml     Last Bowel Movement Date: 09/12/24    Fluid Restriction? No   Quality/Safety    Total Fall Risk Score: 9   Risk for Injury related to falls: Age > 85 years and Standard risk for injury based on the ABCS scoring tool  Fall Risk Category:   History of More Than One Fall Within 6 Months Before Admission: No  Elimination, Bowel and Urine: N/A  Interventions: Place male/male urinal within reach   Medications: On 2 or more high fall risk drugs  Interventions: Educate patient on medication side effects  Patient Care Equipment: N/A  Interventions: N/A - Does not score as risk in this category  Mobility: 2 - Assistance required  Interventions: Assist x1 and Utilize walker, cane, or additional walking aid for ambulation  Cognition: 0 - No cognition issues  Interventions: Bed/Chair Alarm  and Stay within arm's reach while patient ambulating/toileting/showering    Other safety precautions in place: N/A    Restraints:  No      Patient Education  This RN provided education to Patient today. The following education topics were reviewed:  Quality/Safety Education:   Fall risk  Medication Education:   Cardiac medication(s)  Education provided on the following medication(s): torsemide   Cardiac - Specific Education:   Heart failure management zone sheet sent with patient at discharge . Reports he weighs himself every day  General Education:   Discharge planning    The following teaching method(s) were used: Verbal and Handout  Response to learning: Verbalizes Understanding  Needs reinforcement on:  Discharged to assisted living facility.

## 2024-09-14 DIAGNOSIS — F329 Major depressive disorder, single episode, unspecified: Secondary | ICD-10-CM

## 2024-09-14 DIAGNOSIS — I2510 Atherosclerotic heart disease of native coronary artery without angina pectoris: Secondary | ICD-10-CM

## 2024-09-14 DIAGNOSIS — N184 Chronic kidney disease, stage 4 (severe): Secondary | ICD-10-CM

## 2024-09-14 DIAGNOSIS — I5043 Acute on chronic combined systolic (congestive) and diastolic (congestive) heart failure: Secondary | ICD-10-CM

## 2024-09-14 DIAGNOSIS — I4821 Permanent atrial fibrillation: Secondary | ICD-10-CM

## 2024-09-14 DIAGNOSIS — Z87891 Personal history of nicotine dependence: Secondary | ICD-10-CM

## 2024-09-14 DIAGNOSIS — J449 Chronic obstructive pulmonary disease, unspecified: Secondary | ICD-10-CM

## 2024-09-14 DIAGNOSIS — E039 Hypothyroidism, unspecified: Secondary | ICD-10-CM

## 2024-09-14 DIAGNOSIS — E781 Pure hyperglyceridemia: Secondary | ICD-10-CM

## 2024-09-14 DIAGNOSIS — Z6841 Body Mass Index (BMI) 40.0 and over, adult: Secondary | ICD-10-CM

## 2024-09-14 DIAGNOSIS — Z953 Presence of xenogenic heart valve: Secondary | ICD-10-CM

## 2024-09-14 DIAGNOSIS — I5A Non-ischemic myocardial injury (non-traumatic): Secondary | ICD-10-CM

## 2024-09-14 DIAGNOSIS — J9601 Acute respiratory failure with hypoxia: Secondary | ICD-10-CM

## 2024-09-14 DIAGNOSIS — I130 Hypertensive heart and chronic kidney disease with heart failure and stage 1 through stage 4 chronic kidney disease, or unspecified chronic kidney disease: Secondary | ICD-10-CM

## 2024-09-14 DIAGNOSIS — I428 Other cardiomyopathies: Secondary | ICD-10-CM

## 2024-09-14 DIAGNOSIS — E876 Hypokalemia: Secondary | ICD-10-CM

## 2024-09-14 DIAGNOSIS — M109 Gout, unspecified: Secondary | ICD-10-CM

## 2024-10-04 ENCOUNTER — Encounter: Admit: 2024-10-04 | Discharge: 2024-10-04 | Payer: MEDICARE

## 2024-10-11 ENCOUNTER — Encounter: Admit: 2024-10-11 | Discharge: 2024-10-11 | Payer: MEDICARE
